# Patient Record
Sex: Male | Born: 1975 | Race: Black or African American | Hispanic: No | Marital: Single | Smoking: Current every day smoker
Health system: Southern US, Community
[De-identification: ages and names within clinical notes are randomized; demographics above are authoritative.]

## PROBLEM LIST (undated history)

## (undated) DIAGNOSIS — I10 Essential (primary) hypertension: Secondary | ICD-10-CM

## (undated) DIAGNOSIS — I639 Cerebral infarction, unspecified: Secondary | ICD-10-CM

## (undated) DIAGNOSIS — B20 Human immunodeficiency virus [HIV] disease: Secondary | ICD-10-CM

## (undated) DIAGNOSIS — Z21 Asymptomatic human immunodeficiency virus [HIV] infection status: Secondary | ICD-10-CM

## (undated) DIAGNOSIS — E7800 Pure hypercholesterolemia, unspecified: Secondary | ICD-10-CM

---

## 2000-02-11 ENCOUNTER — Encounter: Payer: Self-pay | Admitting: Emergency Medicine

## 2000-02-11 ENCOUNTER — Emergency Department (HOSPITAL_COMMUNITY): Admission: EM | Admit: 2000-02-11 | Discharge: 2000-02-11 | Payer: Self-pay | Admitting: Emergency Medicine

## 2000-02-26 ENCOUNTER — Encounter: Payer: Self-pay | Admitting: Emergency Medicine

## 2000-02-26 ENCOUNTER — Encounter: Payer: Self-pay | Admitting: Internal Medicine

## 2000-02-26 ENCOUNTER — Inpatient Hospital Stay (HOSPITAL_COMMUNITY): Admission: EM | Admit: 2000-02-26 | Discharge: 2000-03-01 | Payer: Self-pay | Admitting: Emergency Medicine

## 2000-02-27 ENCOUNTER — Encounter: Payer: Self-pay | Admitting: Internal Medicine

## 2000-02-28 ENCOUNTER — Encounter: Payer: Self-pay | Admitting: Internal Medicine

## 2000-03-01 ENCOUNTER — Encounter: Payer: Self-pay | Admitting: Pulmonary Disease

## 2000-03-05 ENCOUNTER — Ambulatory Visit (HOSPITAL_COMMUNITY): Admission: RE | Admit: 2000-03-05 | Discharge: 2000-03-05 | Payer: Self-pay | Admitting: Internal Medicine

## 2002-07-30 ENCOUNTER — Encounter: Payer: Self-pay | Admitting: Emergency Medicine

## 2002-07-30 ENCOUNTER — Emergency Department (HOSPITAL_COMMUNITY): Admission: EM | Admit: 2002-07-30 | Discharge: 2002-07-30 | Payer: Self-pay | Admitting: Emergency Medicine

## 2002-08-11 ENCOUNTER — Emergency Department (HOSPITAL_COMMUNITY): Admission: EM | Admit: 2002-08-11 | Discharge: 2002-08-11 | Payer: Self-pay | Admitting: *Deleted

## 2002-08-11 ENCOUNTER — Encounter: Payer: Self-pay | Admitting: *Deleted

## 2019-06-25 ENCOUNTER — Emergency Department (HOSPITAL_COMMUNITY)
Admission: EM | Admit: 2019-06-25 | Discharge: 2019-06-25 | Disposition: A | Discharge status: Home / Self Care | Payer: No Typology Code available for payment source | Attending: Emergency Medicine | Admitting: Emergency Medicine

## 2019-06-25 ENCOUNTER — Emergency Department (HOSPITAL_COMMUNITY): Payer: No Typology Code available for payment source

## 2019-06-25 ENCOUNTER — Encounter (HOSPITAL_COMMUNITY): Payer: Self-pay | Admitting: *Deleted

## 2019-06-25 DIAGNOSIS — S161XXA Strain of muscle, fascia and tendon at neck level, initial encounter: Secondary | ICD-10-CM | POA: Diagnosis not present

## 2019-06-25 DIAGNOSIS — R0789 Other chest pain: Secondary | ICD-10-CM | POA: Diagnosis not present

## 2019-06-25 DIAGNOSIS — Y93I9 Activity, other involving external motion: Secondary | ICD-10-CM | POA: Diagnosis not present

## 2019-06-25 DIAGNOSIS — S0990XA Unspecified injury of head, initial encounter: Secondary | ICD-10-CM | POA: Diagnosis present

## 2019-06-25 DIAGNOSIS — R519 Headache, unspecified: Secondary | ICD-10-CM | POA: Insufficient documentation

## 2019-06-25 DIAGNOSIS — Y999 Unspecified external cause status: Secondary | ICD-10-CM | POA: Diagnosis not present

## 2019-06-25 DIAGNOSIS — Y929 Unspecified place or not applicable: Secondary | ICD-10-CM | POA: Insufficient documentation

## 2019-06-25 DIAGNOSIS — Z21 Asymptomatic human immunodeficiency virus [HIV] infection status: Secondary | ICD-10-CM | POA: Insufficient documentation

## 2019-06-25 DIAGNOSIS — M25511 Pain in right shoulder: Secondary | ICD-10-CM | POA: Diagnosis not present

## 2019-06-25 HISTORY — DX: Pure hypercholesterolemia, unspecified: E78.00

## 2019-06-25 HISTORY — DX: Human immunodeficiency virus (HIV) disease: B20

## 2019-06-25 HISTORY — DX: Asymptomatic human immunodeficiency virus (hiv) infection status: Z21

## 2019-06-25 MED ORDER — METHOCARBAMOL 500 MG PO TABS: 500.0000 mg | ORAL_TABLET | Freq: Three times a day (TID) | ORAL | 0 refills | Status: DC | PRN

## 2019-06-25 MED ORDER — IBUPROFEN 800 MG PO TABS: 800.0000 mg | ORAL_TABLET | Freq: Three times a day (TID) | ORAL | 0 refills | Status: DC | PRN
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
## 2019-06-25 NOTE — Discharge Instructions (Signed)

## 2019-06-25 NOTE — ED Notes (Signed)
Pt called for treatment room multiple times no answer 
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
## 2020-02-24 ENCOUNTER — Encounter (HOSPITAL_COMMUNITY): Payer: Self-pay | Admitting: Emergency Medicine

## 2020-02-24 ENCOUNTER — Other Ambulatory Visit: Payer: Self-pay

## 2020-02-24 ENCOUNTER — Ambulatory Visit (HOSPITAL_COMMUNITY)
Admission: EM | Admit: 2020-02-24 | Discharge: 2020-02-24 | Disposition: A | Discharge status: Home / Self Care | Payer: Self-pay | Attending: Emergency Medicine | Admitting: Emergency Medicine

## 2020-02-24 DIAGNOSIS — M5412 Radiculopathy, cervical region: Secondary | ICD-10-CM

## 2020-02-24 MED ORDER — GABAPENTIN 300 MG PO CAPS: ORAL_CAPSULE | ORAL | 0 refills | Status: DC

## 2020-02-24 MED ORDER — ACETAMINOPHEN 325 MG PO TABS
ORAL_TABLET | ORAL | Status: AC
  Filled 2020-02-24: qty 3

## 2020-02-24 MED ORDER — KETOROLAC TROMETHAMINE 30 MG/ML IJ SOLN
INTRAMUSCULAR | Status: AC
  Filled 2020-02-24: qty 1

## 2020-02-24 MED ORDER — METHYLPREDNISOLONE 4 MG PO TBPK: ORAL_TABLET | Freq: Every day | ORAL | 0 refills | Status: DC

## 2020-02-24 MED ORDER — IBUPROFEN 600 MG PO TABS: 600.0000 mg | ORAL_TABLET | Freq: Four times a day (QID) | ORAL | 0 refills | Status: DC | PRN

## 2020-02-24 MED ORDER — ACETAMINOPHEN 325 MG PO TABS
975.0000 mg | ORAL_TABLET | Freq: Once | ORAL | Status: AC
  Administered 2020-02-24: 975 mg via ORAL

## 2020-02-24 MED ORDER — KETOROLAC TROMETHAMINE 30 MG/ML IJ SOLN
30.0000 mg | Freq: Once | INTRAMUSCULAR | Status: AC
  Administered 2020-02-24: 30 mg via INTRAMUSCULAR

## 2020-02-24 MED ORDER — TIZANIDINE HCL 4 MG PO TABS: 4.0000 mg | ORAL_TABLET | Freq: Three times a day (TID) | ORAL | 0 refills | Status: DC | PRN

## 2020-02-24 NOTE — ED Provider Notes (Signed)
HPI  SUBJECTIVE:  Juan Townsend is a 44 y.o. male who presents with 2 days of sharp, burning, stabbing pain starting in his right shoulder blade/neck, states that it has now migrated and is radiating down the outside of his arm to his arm.  He reports muscle spasm and numbness in his thumb.  States that he is constantly lifting things at work, but has not been working more recently.  No change in his physical activity.  No rash, fevers, body aches, flulike symptoms.  No trauma to the neck or shoulder.  No tingling, grip weakness.  No chest pain, shortness of breath.  No antipyretic in the past 4 to 6 hours.  He tried stretching without improvement in symptoms.  Symptoms are worse with neck extension, rotation.  He has never had symptoms like this before.  He has a past medical history of HIV viral load undetectable.  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  No history of osteoporosis, IVDU, and diabetes.  PMD: Earvin Hansen at [facility] in [place].    Past Medical History:  Diagnosis Date  . HIV (human immunodeficiency virus infection) ([facility])   . Hypercholesteremia     History reviewed. No pertinent surgical history.  History reviewed. No pertinent family history.  Social History   Tobacco Use  . Smoking status: Current Every Day Smoker  Substance Use Topics  . Alcohol use: Yes    Alcohol/week: 1.0 standard drink    Types: 1 Cans of beer per week  . Drug use: Yes    Types: Marijuana    No current facility-administered medications for this encounter.  Current Outpatient Medications:  .  aspirin 325 MG tablet, Take 325 mg by mouth daily., Disp: , Rfl:  .  elvitegravir-cobicistat-emtricitabine-tenofovir (GENVOYA) 150-150-200-10 MG TABS tablet, Take 1 tablet by mouth daily., Disp: , Rfl:  .  atorvastatin (LIPITOR) 20 MG tablet, Take 20 mg by mouth daily., Disp: , Rfl:  .   gabapentin (NEURONTIN) 300 MG capsule, 1 tab po at bedtime on day 1, 1 tablet bid on day 2, then 1 tablet tid on day 3., Disp: 30 capsule, Rfl: 0 .  ibuprofen (ADVIL) 600 MG tablet, Take 1 tablet (600 mg total) by mouth every 6 (six) hours as needed., Disp: 30 tablet, Rfl: 0 .  lisinopril (ZESTRIL) 10 MG tablet, Take 10 mg by mouth daily., Disp: , Rfl:  .  methylPREDNISolone (MEDROL DOSEPAK) 4 MG TBPK tablet, Take by mouth daily. Follow package instructions, Disp: 21 tablet, Rfl: 0 .  tiZANidine (ZANAFLEX) 4 MG tablet, Take 1 tablet (4 mg total) by mouth every 8 (eight) hours as needed for muscle spasms., Disp: 30 tablet, Rfl: 0  No Known Allergies   ROS  As noted in HPI.   Physical Exam  BP 132/83 (BP Location: Left Arm)   Pulse 75   Temp 98.4 F (36.9 C) (Oral)   Resp 18   SpO2 96%   Constitutional: Well developed, well nourished, no acute distress Eyes:  EOMI, conjunctiva normal bilaterally HENT: Normocephalic, atraumatic,mucus membranes moist Respiratory: Normal inspiratory effort Cardiovascular: Normal rate GI: nondistended skin: No rash over torso, arm. Musculoskeletal: no deformities.  No tenderness over the shoulder. No limitation of motion at the shoulder.Marland Kitchen  Positive right trapezial tenderness, spasm.  Hypersensitive to touch over the deltoid in the right C6 distribution.  Grip, bicep, tricep strength 5/5 and equal bilaterally.  RP 2+ and  equal.  No C-spine, T-spine tenderness.  Sensation to light touch and temperature grossly intact over entire arm including the C6 distribution. Neurologic: Alert & oriented x 3, no focal neuro deficits Psychiatric: Speech and behavior appropriate   ED Course   Medications  acetaminophen (TYLENOL) tablet 975 mg (975 mg Oral Given 02/24/20 1953)  ketorolac (TORADOL) 30 MG/ML injection 30 mg (30 mg Intramuscular Given 02/24/20 1953)    No orders of the defined types were placed in this encounter.   No results found for this or any  previous visit (from the past 24 hour(s)). No results found.  ED Clinical Impression  1. Cervical radiculopathy      ED Assessment/Plan  Presentation sounds musculoskeletal with a radicular component.  Given the absence of trauma or bony tenderness, deferred imaging.  However in the differential is early shingles with a history of HIV, even though his HIV viral load is undetectable. Will send home with 600 ibuprofen/thousand Tylenol 3-4 times a day, Zanaflex, short course of Neurontin. will give him a wait-and-see Medrol dose pack.  He is to wait 3 more days to see if a rash shows up.  Deferring initiating steroid treatment today in case this is shingles.  Toradol 30 mg IM, 975 mg of Tylenol here.  Follow-up with PMD or may return here as needed, particularly if a rash appears. To the ER if he gets worse, arm weakness, or other concerns.  Discussed  MDM, treatment plan, and plan for follow-up with patient. Discussed sn/sx that should prompt return to the ED. patient agrees with plan.   Meds ordered this encounter  Medications  . acetaminophen (TYLENOL) tablet 975 mg  . ketorolac (TORADOL) 30 MG/ML injection 30 mg  . methylPREDNISolone (MEDROL DOSEPAK) 4 MG TBPK tablet    Sig: Take by mouth daily. Follow package instructions    Dispense:  21 tablet    Refill:  0  . ibuprofen (ADVIL) 600 MG tablet    Sig: Take 1 tablet (600 mg total) by mouth every 6 (six) hours as needed.    Dispense:  30 tablet    Refill:  0  . tiZANidine (ZANAFLEX) 4 MG tablet    Sig: Take 1 tablet (4 mg total) by mouth every 8 (eight) hours as needed for muscle spasms.    Dispense:  30 tablet    Refill:  0  . gabapentin (NEURONTIN) 300 MG capsule    Sig: 1 tab po at bedtime on day 1, 1 tablet bid on day 2, then 1 tablet tid on day 3.    Dispense:  30 capsule    Refill:  0    *This clinic note was created using [employer]. Therefore, there may be occasional mistakes despite careful  proofreading.   ?    Melynda Ripple, MD 02/25/20 [phone]

## 2020-02-24 NOTE — Discharge Instructions (Addendum)
Take 600 mg of ibuprofen combined with 1000 mg of Tylenol 3-4 times a day as needed for pain.  Zanaflex will help with muscle spasms the Neurontin will help with the nerve pain.  Wait at least 3 days to start the Medrol dose pack.  If a rash appears, this is not musculoskeletal, but is shingles, and steroids could be very dangerous.  Come back here or see your doctor we will start you on antivirals and other pain medication.  If no rash appears after 5 or 6 days of being symptomatic, go ahead and start the steroids.

## 2020-02-24 NOTE — ED Triage Notes (Signed)
Right arm pain for 2 days.  No known injury.  Pain radiates from right side of neck, down right arm, to right thumb.  Reports this is sharp pain

## 2020-07-18 ENCOUNTER — Emergency Department (HOSPITAL_COMMUNITY)
Admission: EM | Admit: 2020-07-18 | Discharge: 2020-07-18 | Disposition: A | Discharge status: Home / Self Care | Payer: Self-pay | Attending: Emergency Medicine | Admitting: Emergency Medicine

## 2020-07-18 ENCOUNTER — Other Ambulatory Visit: Payer: Self-pay

## 2020-07-18 ENCOUNTER — Encounter (HOSPITAL_COMMUNITY): Payer: Self-pay

## 2020-07-18 DIAGNOSIS — F17200 Nicotine dependence, unspecified, uncomplicated: Secondary | ICD-10-CM | POA: Insufficient documentation

## 2020-07-18 DIAGNOSIS — A09 Infectious gastroenteritis and colitis, unspecified: Secondary | ICD-10-CM | POA: Insufficient documentation

## 2020-07-18 DIAGNOSIS — Z7982 Long term (current) use of aspirin: Secondary | ICD-10-CM | POA: Insufficient documentation

## 2020-07-18 DIAGNOSIS — Z21 Asymptomatic human immunodeficiency virus [HIV] infection status: Secondary | ICD-10-CM | POA: Insufficient documentation

## 2020-07-18 LAB — URINALYSIS, ROUTINE W REFLEX MICROSCOPIC
Bilirubin Urine: NEGATIVE
Glucose, UA: NEGATIVE mg/dL
Ketones, ur: 20 mg/dL — AB
Leukocytes,Ua: NEGATIVE
Nitrite: NEGATIVE
Protein, ur: 30 mg/dL — AB
Specific Gravity, Urine: 1.024 (ref 1.005–1.030)
pH: 6 (ref 5.0–8.0)

## 2020-07-18 LAB — C DIFFICILE QUICK SCREEN W PCR REFLEX
C Diff antigen: NEGATIVE
C Diff interpretation: NOT DETECTED
C Diff toxin: NEGATIVE

## 2020-07-18 LAB — GASTROINTESTINAL PANEL BY PCR, STOOL (REPLACES STOOL CULTURE)

## 2020-07-18 LAB — CBC
HCT: 44.4 % (ref 39.0–52.0)
Hemoglobin: 15.4 g/dL (ref 13.0–17.0)
MCH: 30.3 pg (ref 26.0–34.0)
MCHC: 34.7 g/dL (ref 30.0–36.0)
MCV: 87.4 fL (ref 80.0–100.0)
Platelets: 268 10*3/uL (ref 150–400)
RBC: 5.08 MIL/uL (ref 4.22–5.81)
RDW: 11.8 % (ref 11.5–15.5)
WBC: 14.4 10*3/uL — ABNORMAL HIGH (ref 4.0–10.5)
nRBC: 0 % (ref 0.0–0.2)

## 2020-07-18 LAB — COMPREHENSIVE METABOLIC PANEL
ALT: 19 U/L (ref 0–44)
AST: 19 U/L (ref 15–41)
Albumin: 3.5 g/dL (ref 3.5–5.0)
Alkaline Phosphatase: 58 U/L (ref 38–126)
Anion gap: 11 (ref 5–15)
BUN: 5 mg/dL — ABNORMAL LOW (ref 6–20)
CO2: 24 mmol/L (ref 22–32)
Calcium: 9.2 mg/dL (ref 8.9–10.3)
Chloride: 98 mmol/L (ref 98–111)
Creatinine, Ser: 1.03 mg/dL (ref 0.61–1.24)
GFR, Estimated: >60 mL/min (ref >60)
Glucose, Bld: 97 mg/dL (ref 70–99)
Potassium: 3.2 mmol/L — ABNORMAL LOW (ref 3.5–5.1)
Sodium: 133 mmol/L — ABNORMAL LOW (ref 135–145)
Total Bilirubin: 0.7 mg/dL (ref 0.3–1.2)
Total Protein: 8.4 g/dL — ABNORMAL HIGH (ref 6.5–8.1)

## 2020-07-18 LAB — LIPASE, BLOOD: Lipase: 24 U/L (ref 11–51)

## 2020-07-18 MED ORDER — ONDANSETRON 4 MG PO TBDP
4.0000 mg | ORAL_TABLET | Freq: Once | ORAL | Status: AC
  Administered 2020-07-18: 4 mg via ORAL
  Filled 2020-07-18: qty 1

## 2020-07-18 MED ORDER — ONDANSETRON 4 MG PO TBDP: 4.0000 mg | ORAL_TABLET | Freq: Three times a day (TID) | ORAL | 0 refills | Status: DC | PRN

## 2020-07-18 NOTE — ED Triage Notes (Signed)
Pt reports constant diarrhea for the past four days. Some nausea, denies vomiting or fevers.

## 2020-07-18 NOTE — Discharge Instructions (Addendum)
Continue to hydrate well.  You can add fiber to your diet.  Stool testing is being performed, if there is a significant finding that requires treatment you will be contacted.  If not this is likely infectious diarrhea that will run its course without significant intervention.  Zofran is provided for as needed nausea.

## 2020-07-18 NOTE — ED Provider Notes (Signed)
[facility] EMERGENCY DEPARTMENT Provider Note   CSN: [id number] Arrival date & time: 07/18/20  0453     History Chief Complaint  Patient presents with  . Diarrhea    Juan Townsend is a 44 y.o. male.   Diarrhea Quality:  Semi-solid and watery Severity:  Moderate Duration:  4 days Timing:  Constant Progression:  Unchanged Relieved by:  Nothing Worsened by:  Nothing Ineffective treatments:  None tried Associated symptoms: abdominal pain   Associated symptoms: no arthralgias, no chills, no diaphoresis, no fever, no headaches and no vomiting        Past Medical History:  Diagnosis Date  . HIV (human immunodeficiency virus infection) (HCC)   . Hypercholesteremia     There are no problems to display for this patient.   History reviewed. No pertinent surgical history.     No family history on file.  Social History   Tobacco Use  . Smoking status: Current Every Day Smoker  Substance Use Topics  . Alcohol use: Yes    Alcohol/week: 1.0 standard drink    Types: 1 Cans of beer per week  . Drug use: Yes    Types: Marijuana    Home Medications Prior to Admission medications   Medication Sig Start Date End Date Taking? Authorizing Provider  aspirin 325 MG tablet Take 325 mg by mouth daily.   Yes [provider]  elvitegravir-cobicistat-emtricitabine-tenofovir (GENVOYA) 150-150-200-10 MG TABS tablet Take 1 tablet by mouth daily. 12/17/19  Yes [provider]  lisinopril (ZESTRIL) 10 MG tablet Take 10 mg by mouth daily. 11/20/19  Yes [provider]  gabapentin (NEURONTIN) 300 MG capsule 1 tab po at bedtime on day 1, 1 tablet bid on day 2, then 1 tablet tid on day 3. Patient not taking: Reported on 07/18/2020 02/24/20   Domenick Gong, MD  ibuprofen (ADVIL) 600 MG tablet Take 1 tablet (600 mg total) by mouth every 6 (six) hours as needed. Patient not taking: Reported on 07/18/2020 02/24/20   Domenick Gong, MD    methylPREDNISolone (MEDROL DOSEPAK) 4 MG TBPK tablet Take by mouth daily. Follow package instructions Patient not taking: Reported on 07/18/2020 02/24/20   Domenick Gong, MD  ondansetron (ZOFRAN ODT) 4 MG disintegrating tablet Take 1 tablet (4 mg total) by mouth every 8 (eight) hours as needed for up to 10 doses for nausea or vomiting. 07/18/20   Sabino Donovan, MD  tiZANidine (ZANAFLEX) 4 MG tablet Take 1 tablet (4 mg total) by mouth every 8 (eight) hours as needed for muscle spasms. Patient not taking: Reported on 07/18/2020 02/24/20   Domenick Gong, MD    Allergies    Patient has no known allergies.  Review of Systems   Review of Systems  Constitutional: Negative for chills, diaphoresis and fever.  HENT: Negative for congestion and rhinorrhea.   Respiratory: Negative for cough and shortness of breath.   Cardiovascular: Negative for chest pain and palpitations.  Gastrointestinal: Positive for abdominal pain, blood in stool (possible) and diarrhea. Negative for nausea and vomiting.  Genitourinary: Negative for difficulty urinating and dysuria.  Musculoskeletal: Negative for arthralgias and back pain.  Skin: Negative for color change and rash.  Neurological: Negative for light-headedness and headaches.    Physical Exam Updated Vital Signs BP (!) 155/108   Pulse 81   Temp 98 F (36.7 C) (Oral)   Resp 17   Ht 5\' 7"  (1.702 m)   Wt 72.6 kg   SpO2 100%   BMI  25.06 kg/m   Physical Exam Vitals and nursing note reviewed.  Constitutional:      General: He is not in acute distress.    Appearance: Normal appearance.  HENT:     Head: Normocephalic and atraumatic.     Nose: No rhinorrhea.     Mouth/Throat:     Mouth: Mucous membranes are moist.  Eyes:     General:        Right eye: No discharge.        Left eye: No discharge.     Conjunctiva/sclera: Conjunctivae normal.  Cardiovascular:     Rate and Rhythm: Normal rate and regular rhythm.  Pulmonary:     Effort: Pulmonary  effort is normal.     Breath sounds: No stridor.  Abdominal:     General: Abdomen is flat. There is no distension.     Palpations: Abdomen is soft.     Tenderness: There is no abdominal tenderness. There is no guarding.  Musculoskeletal:        General: No deformity or signs of injury.  Skin:    General: Skin is warm and dry.     Capillary Refill: Capillary refill takes less than 2 seconds.  Neurological:     General: No focal deficit present.     Mental Status: He is alert. Mental status is at baseline.     Motor: No weakness.  Psychiatric:        Mood and Affect: Mood normal.        Behavior: Behavior normal.        Thought Content: Thought content normal.     ED Results / Procedures / Treatments   Labs (all labs ordered are listed, but only abnormal results are displayed) Labs Reviewed  COMPREHENSIVE METABOLIC PANEL - Abnormal; Notable for the following components:      Result Value   Sodium 133 (*)    Potassium 3.2 (*)    BUN 5 (*)    Total Protein 8.4 (*)    All other components within normal limits  CBC - Abnormal; Notable for the following components:   WBC 14.4 (*)    All other components within normal limits  URINALYSIS, ROUTINE W REFLEX MICROSCOPIC - Abnormal; Notable for the following components:   Color, Urine AMBER (*)    APPearance HAZY (*)    Hgb urine dipstick MODERATE (*)    Ketones, ur 20 (*)    Protein, ur 30 (*)    Bacteria, UA RARE (*)    All other components within normal limits  GASTROINTESTINAL PANEL BY PCR, STOOL (REPLACES STOOL CULTURE)  C DIFFICILE QUICK SCREEN W PCR REFLEX  LIPASE, BLOOD    EKG None  Radiology No results found.  Procedures Procedures (including critical care time)  Medications Ordered in ED Medications  ondansetron (ZOFRAN-ODT) disintegrating tablet 4 mg (has no administration in time range)    ED Course  I have reviewed the triage vital signs and the nursing notes.  Pertinent labs & imaging results that  were available during my care of the patient were reviewed by me and considered in my medical decision making (see chart for details).    MDM Rules/Calculators/A&P                          4 days of watery semisolid stools, frequent episodes a day.  Mild nausea and abdominal cramping, no signs of peritonitis on exam vital signs stable lab studies  done first look reviewed by myself show only leukocytosis, mild hypokalemia otherwise unremarkable.  Patient does have HIV but says his CD4 counts are high and his viral load is low.  He is well-appearing overall looks well-hydrated.  Supple infectious diarrhea, stool testing will be sent, Zofran discharged home with him, strict return precautions given. Final Clinical Impression(s) / ED Diagnoses Final diagnoses:  Diarrhea, infectious, adult    Rx / DC Orders ED Discharge Orders         Ordered    ondansetron (ZOFRAN ODT) 4 MG disintegrating tablet  Every 8 hours PRN        07/18/20 0704           Sabino Donovan, MD 07/18/20 [phone]
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
## 2023-10-15 ENCOUNTER — Other Ambulatory Visit (HOSPITAL_COMMUNITY): Payer: Self-pay

## 2023-10-15 ENCOUNTER — Telehealth: Payer: Self-pay

## 2023-10-15 NOTE — Telephone Encounter (Signed)
 Pharmacy Patient Advocate Encounter  Insurance verification completed.   The patient is insured through [facility]   Ran test claim for Biktarvy . Currently a quantity of 30 is a 30 day supply and the co-pay is $3913.00 . Dovato $7122.85 with evoucher $0.00 Arlyce is covered as well.  This test claim was processed through [facility]- copay amounts may vary at other pharmacies due to pharmacy/plan contracts, or as the patient moves through the different stages of their insurance plan.

## 2023-10-17 ENCOUNTER — Other Ambulatory Visit: Payer: Self-pay

## 2023-10-17 ENCOUNTER — Ambulatory Visit (INDEPENDENT_AMBULATORY_CARE_PROVIDER_SITE_OTHER): Payer: Self-pay | Admitting: Internal Medicine

## 2023-10-17 ENCOUNTER — Encounter: Payer: Self-pay | Admitting: Internal Medicine

## 2023-10-17 ENCOUNTER — Ambulatory Visit: Payer: Self-pay

## 2023-10-17 VITALS — BP 227/137 | HR 74 | Temp 99.0°F | Ht 69.0 in | Wt 149.0 lb

## 2023-10-17 DIAGNOSIS — Z23 Encounter for immunization: Secondary | ICD-10-CM

## 2023-10-17 DIAGNOSIS — B20 Human immunodeficiency virus [HIV] disease: Secondary | ICD-10-CM

## 2023-10-17 MED ORDER — ELVITEG-COBIC-EMTRICIT-TENOFAF 150-150-200-10 MG PO TABS: 1.0000 | ORAL_TABLET | Freq: Every day | ORAL | 0 refills | Status: DC

## 2023-10-17 NOTE — Patient Instructions (Signed)
 For primary care, please call either [facility] [phone]  - Internal Medicine [phone]
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
## 2023-10-18 ENCOUNTER — Encounter: Payer: Self-pay | Admitting: Internal Medicine

## 2023-10-18 LAB — T-HELPER CELL (CD4) - (RCID CLINIC ONLY)
CD4 % Helper T Cell: 24 % — ABNORMAL LOW (ref 33–65)
CD4 T Cell Abs: 346 /uL — ABNORMAL LOW (ref 400–1790)

## 2023-10-21 LAB — CYTOLOGY, (ORAL, ANAL, URETHRAL) ANCILLARY ONLY
Chlamydia: NEGATIVE
Chlamydia: NEGATIVE
Comment: NEGATIVE
Comment: NEGATIVE
Comment: NORMAL
Comment: NORMAL
Neisseria Gonorrhea: NEGATIVE
Neisseria Gonorrhea: NEGATIVE

## 2023-10-21 LAB — URINE CYTOLOGY ANCILLARY ONLY
Chlamydia: NEGATIVE
Comment: NEGATIVE
Comment: NORMAL
Neisseria Gonorrhea: NEGATIVE

## 2023-10-24 ENCOUNTER — Telehealth: Payer: Self-pay

## 2023-10-24 ENCOUNTER — Ambulatory Visit: Payer: Self-pay | Admitting: Internal Medicine

## 2023-10-24 ENCOUNTER — Other Ambulatory Visit: Payer: Self-pay

## 2023-10-24 VITALS — BP 208/141 | HR 119 | Temp 99.4°F

## 2023-10-24 DIAGNOSIS — R030 Elevated blood-pressure reading, without diagnosis of hypertension: Secondary | ICD-10-CM

## 2023-10-24 DIAGNOSIS — Z21 Asymptomatic human immunodeficiency virus [HIV] infection status: Secondary | ICD-10-CM

## 2023-10-24 DIAGNOSIS — B20 Human immunodeficiency virus [HIV] disease: Secondary | ICD-10-CM

## 2023-10-24 LAB — CYTOLOGY - PAP: Diagnosis: NEGATIVE

## 2023-10-24 MED ORDER — BICTEGRAVIR-EMTRICITAB-TENOFOV 50-200-25 MG PO TABS: 1.0000 | ORAL_TABLET | Freq: Every day | ORAL | 1 refills | Status: DC

## 2023-10-24 NOTE — Progress Notes (Signed)
Provider recommended patient go to ED for HTN. Emphasized importance of urgent evaluation due to high risk for stroke. Reviewed BEFAST symptoms with patient.   Sandie Ano, RN

## 2023-10-24 NOTE — Progress Notes (Signed)
 [facility]     HPI: Juan Townsend is a 48 y.o. male presents for HIV management referrd vy [facility] CD4 685(37.8%), VL <20 on 12/17/19.  PT has been out of genvoya x 2 months. Pt was seen in [place] and HD for HIV care. He was last seen about 4 months.  He moved to [place], because he was "escaping from [place], [place] because nothing was gong on". He can't remember the name of hte place where he was seen for HIV care.  PMHxL Ischemic cva recieved tpA  Date of diagnosis 2008 ART exposure Atripla->Genvoya Past Ois: bibe Risk factors: MSM Partners in last 2months 0, in the last 12 months 0. Last sexually active for years.  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  No family history on file. Current Outpatient Medications on File Prior to Visit  Medication Sig Dispense Refill   aspirin 325 MG tablet Take 325 mg by mouth daily. (Patient not taking: Reported on 10/17/2023)     elvitegravir-cobicistat-emtricitabine-tenofovir (GENVOYA) 150-150-200-10 MG TABS tablet Take 1 tablet by mouth daily. 30 tablet 0   gabapentin (NEURONTIN) 300 MG capsule 1 tab po at bedtime on day 1, 1 tablet bid on day 2, then 1 tablet tid on day 3. (Patient not taking: Reported on 07/18/2020) 30 capsule 0   ibuprofen (ADVIL) 600 MG tablet Take 1 tablet (600 mg total) by mouth every 6 (six) hours as needed. (Patient not taking: Reported on 07/18/2020) 30 tablet 0   lisinopril (ZESTRIL) 10 MG tablet Take 10 mg by mouth daily. (Patient not taking: Reported on 10/17/2023)     methylPREDNISolone (MEDROL DOSEPAK) 4 MG TBPK tablet Take by mouth daily. Follow package  instructions (Patient not taking: Reported on 07/18/2020) 21 tablet 0   ondansetron (ZOFRAN ODT) 4 MG disintegrating tablet Take 1 tablet (4 mg total) by mouth every 8 (eight) hours as needed for up to 10 doses for nausea or vomiting. (Patient not taking: Reported on 10/17/2023) 10 tablet 0   tiZANidine (ZANAFLEX) 4 MG tablet Take 1 tablet (4 mg total) by mouth every 8 (eight) hours as needed for muscle spasms. (Patient not taking: Reported on 07/18/2020) 30 tablet 0   No current facility-administered medications on file prior to visit.    No Known Allergies    Lab Results HIV 1 RNA Quant (copies/mL)  Date Value  10/17/2023 2,310 (H)   CD4 T Cell Abs (/uL)  Date Value  10/17/2023 346 (L)   No results found for: "HIV1GENOSEQ" Lab Results  Component Value Date   WBC 4.3 10/17/2023   HGB 12.4 (L) 10/17/2023   HCT 36.5 (L) 10/17/2023   MCV 89.9 10/17/2023   PLT 221 10/17/2023    Lab Results  Component Value Date   CREATININE 1.24 10/17/2023   BUN 13 10/17/2023   NA 138 10/17/2023   K 3.9 10/17/2023   CL 104 10/17/2023   CO2 26 10/17/2023   Lab Results  Component Value Date   ALT 11 10/17/2023   AST 19 10/17/2023   ALKPHOS 58 07/18/2020   BILITOT 0.8 10/17/2023    Lab Results  Component  Value Date   CHOL 128 10/17/2023   TRIG 56 10/17/2023   HDL 43 10/17/2023   LDLCALC 71 10/17/2023   Lab Results  Component Value Date   HAV REACTIVE (A) 10/17/2023   Lab Results  Component Value Date   HEPBSAG NON-REACTIVE 10/17/2023   HEPBSAB REACTIVE (A) 10/17/2023   No results found for: "HCVAB" Lab Results  Component Value Date   CHLAMYDIAWP Negative 10/17/2023   CHLAMYDIAWP Negative 10/17/2023   CHLAMYDIAWP Negative 10/17/2023   N Negative 10/17/2023   N Negative 10/17/2023   N Negative 10/17/2023   No results found for: "GCPROBEAPT" No results found for: "QUANTGOLD"  Assessment/Plan  Assessment and Plan #HIV/Asymptomatic VL 2310, CD4 346 on 10/17/23 Start  biktarvy. Stop Genvoya Met with pharmacy HIV Labs today Labs in 2 week with greg calone  #Elevated BP -BP elevated, no symptoms of HA, dizziness or blurry vision. Counsled to go to ED for HTN, next available new patient PCP appt in April.  -Pt agreed with plan.    #Hx of CVA -lipid panel -likely start statin. Note to be on atorvastatin 20 mg on 05/09/2018     #Vaccination->requested from [facility] COVID Flu-today Monkeypox PCV Meningitis HepA HEpB Tdap Shingles   #Health maintenance Labs  10/16/22 -Quantiferon negative -RPR NR -HCV negative -GC 3 point negative -Lipid  LDL 71 -Dysplasia screen M NLLM on 26/24 Discuss NV: -Colonoscopy    Danelle Earthly, MD [facility]   I have personally spent 45 minutes involved in face-to-face and non-face-to-face activities for this patient on the day of the visit. Professional time spent includes the following activities: Preparing to see the patient (review of tests), Obtaining and/or reviewing separately obtained history (admission/discharge record), Performing a medically appropriate examination and/or evaluation , Ordering medications/tests/procedures, referring and communicating with other health care professionals, Documenting clinical information in the EMR, Independently interpreting results (not separately reported), Communicating results to the patient/family/caregiver, Counseling and educating the patient/family/caregiver and Care coordination (not separately reported).

## 2023-10-24 NOTE — Patient Instructions (Signed)
For primary care, please call either [facility] [phone]  - Internal Medicine [phone]

## 2023-10-24 NOTE — Telephone Encounter (Signed)
Spoke with the patient today and counseled on Biktarvy. We discussed on how to take Biktarvy and the importance of keeping up with doses and appointments. Patient was instructed to reach out to [place] if at any point he needs samples to carry him over. His prescription was sent to [facility] on [address]. He will reach out to [place] if he has any questions.

## 2023-10-26 ENCOUNTER — Encounter (HOSPITAL_COMMUNITY): Payer: Self-pay

## 2023-10-26 ENCOUNTER — Emergency Department (HOSPITAL_COMMUNITY)
Admission: EM | Admit: 2023-10-26 | Discharge: 2023-10-26 | Disposition: A | Discharge status: Home / Self Care | Payer: Self-pay | Attending: Emergency Medicine | Admitting: Emergency Medicine

## 2023-10-26 ENCOUNTER — Other Ambulatory Visit: Payer: Self-pay

## 2023-10-26 DIAGNOSIS — Z21 Asymptomatic human immunodeficiency virus [HIV] infection status: Secondary | ICD-10-CM | POA: Insufficient documentation

## 2023-10-26 DIAGNOSIS — Z7982 Long term (current) use of aspirin: Secondary | ICD-10-CM | POA: Insufficient documentation

## 2023-10-26 DIAGNOSIS — R7989 Other specified abnormal findings of blood chemistry: Secondary | ICD-10-CM | POA: Insufficient documentation

## 2023-10-26 DIAGNOSIS — I10 Essential (primary) hypertension: Secondary | ICD-10-CM | POA: Insufficient documentation

## 2023-10-26 DIAGNOSIS — Z79899 Other long term (current) drug therapy: Secondary | ICD-10-CM | POA: Insufficient documentation

## 2023-10-26 LAB — HEPATITIS B CORE ANTIBODY, TOTAL: Hep B Core Total Ab: NONREACTIVE

## 2023-10-26 LAB — URINALYSIS, ROUTINE W REFLEX MICROSCOPIC
Bilirubin Urine: NEGATIVE
Glucose, UA: NEGATIVE mg/dL
Ketones, ur: 5 mg/dL — AB
Leukocytes,Ua: NEGATIVE
Nitrite: NEGATIVE
Protein, ur: 30 mg/dL — AB
Specific Gravity, Urine: 1.018 (ref 1.005–1.030)
pH: 5 (ref 5.0–8.0)

## 2023-10-26 LAB — CBC WITH DIFFERENTIAL/PLATELET
Abs Immature Granulocytes: 0.03 10*3/uL (ref 0.00–0.07)
Absolute Lymphocytes: 1660 {cells}/uL (ref 850–3900)
Absolute Monocytes: 301 {cells}/uL (ref 200–950)
Basophils Absolute: 30 {cells}/uL (ref 0–200)
Basophils Absolute: 0 10*3/uL (ref 0.0–0.1)
Basophils Relative: 0.7 %
Basophils Relative: 0 %
Eosinophils Absolute: 39 {cells}/uL (ref 15–500)
Eosinophils Absolute: 0 10*3/uL (ref 0.0–0.5)
Eosinophils Relative: 0.9 %
Eosinophils Relative: 0 %
HCT: 36.5 % — ABNORMAL LOW (ref 38.5–50.0)
HCT: 37.5 % — ABNORMAL LOW (ref 39.0–52.0)
Hemoglobin: 12.4 g/dL — ABNORMAL LOW (ref 13.2–17.1)
Hemoglobin: 13.1 g/dL (ref 13.0–17.0)
Immature Granulocytes: 0 %
Lymphocytes Relative: 15 %
Lymphs Abs: 1.7 10*3/uL (ref 0.7–4.0)
MCH: 30.5 pg (ref 27.0–33.0)
MCH: 30.8 pg (ref 26.0–34.0)
MCHC: 34 g/dL (ref 32.0–36.0)
MCHC: 34.9 g/dL (ref 30.0–36.0)
MCV: 89.9 fL (ref 80.0–100.0)
MCV: 88.2 fL (ref 80.0–100.0)
MPV: 11 fL (ref 7.5–12.5)
Monocytes Absolute: 1.1 10*3/uL — ABNORMAL HIGH (ref 0.1–1.0)
Monocytes Relative: 7 %
Monocytes Relative: 9 %
Neutro Abs: 2270 {cells}/uL (ref 1500–7800)
Neutro Abs: 8.7 10*3/uL — ABNORMAL HIGH (ref 1.7–7.7)
Neutrophils Relative %: 52.8 %
Neutrophils Relative %: 76 %
Platelets: 221 10*3/uL (ref 140–400)
Platelets: 188 10*3/uL (ref 150–400)
RBC: 4.06 10*6/uL — ABNORMAL LOW (ref 4.20–5.80)
RBC: 4.25 MIL/uL (ref 4.22–5.81)
RDW: 11.9 % (ref 11.0–15.0)
RDW: 12.1 % (ref 11.5–15.5)
Total Lymphocyte: 38.6 %
WBC: 4.3 10*3/uL (ref 3.8–10.8)
WBC: 11.5 10*3/uL — ABNORMAL HIGH (ref 4.0–10.5)
nRBC: 0 % (ref 0.0–0.2)

## 2023-10-26 LAB — HIV-1 INTEGRASE GENOTYPE

## 2023-10-26 LAB — BASIC METABOLIC PANEL
Anion gap: 13 (ref 5–15)
BUN: 22 mg/dL — ABNORMAL HIGH (ref 6–20)
CO2: 22 mmol/L (ref 22–32)
Calcium: 8.9 mg/dL (ref 8.9–10.3)
Chloride: 94 mmol/L — ABNORMAL LOW (ref 98–111)
Creatinine, Ser: 1.61 mg/dL — ABNORMAL HIGH (ref 0.61–1.24)
GFR, Estimated: 53 mL/min — ABNORMAL LOW (ref >60)
Glucose, Bld: 101 mg/dL — ABNORMAL HIGH (ref 70–99)
Potassium: 4.5 mmol/L (ref 3.5–5.1)
Sodium: 129 mmol/L — ABNORMAL LOW (ref 135–145)

## 2023-10-26 LAB — COMPLETE METABOLIC PANEL WITH GFR
AG Ratio: 1 (calc) (ref 1.0–2.5)
ALT: 11 U/L (ref 9–46)
AST: 19 U/L (ref 10–40)
Albumin: 4.2 g/dL (ref 3.6–5.1)
Alkaline phosphatase (APISO): 41 U/L (ref 36–130)
BUN: 13 mg/dL (ref 7–25)
CO2: 26 mmol/L (ref 20–32)
Calcium: 9.5 mg/dL (ref 8.6–10.3)
Chloride: 104 mmol/L (ref 98–110)
Creat: 1.24 mg/dL (ref 0.60–1.29)
Globulin: 4.4 g/dL — ABNORMAL HIGH (ref 1.9–3.7)
Glucose, Bld: 78 mg/dL (ref 65–99)
Potassium: 3.9 mmol/L (ref 3.5–5.3)
Sodium: 138 mmol/L (ref 135–146)
Total Bilirubin: 0.8 mg/dL (ref 0.2–1.2)
Total Protein: 8.6 g/dL — ABNORMAL HIGH (ref 6.1–8.1)
eGFR: 73 mL/min/{1.73_m2} (ref >=60)

## 2023-10-26 LAB — HIV-1 GENOTYPE: HIV-1 Genotype: DETECTED — AB

## 2023-10-26 LAB — LIPID PANEL
Cholesterol: 128 mg/dL (ref <200)
HDL: 43 mg/dL (ref >=40)
LDL Cholesterol (Calc): 71 mg/dL
Non-HDL Cholesterol (Calc): 85 mg/dL (ref <130)
Total CHOL/HDL Ratio: 3 (calc) (ref <5.0)
Triglycerides: 56 mg/dL (ref <150)

## 2023-10-26 LAB — URINALYSIS
Bilirubin Urine: NEGATIVE
Glucose, UA: NEGATIVE
Hgb urine dipstick: NEGATIVE
Ketones, ur: NEGATIVE
Nitrite: NEGATIVE
Specific Gravity, Urine: 1.017 (ref 1.001–1.035)
pH: 6.5 (ref 5.0–8.0)

## 2023-10-26 LAB — HEPATITIS A ANTIBODY, TOTAL: Hepatitis A AB,Total: REACTIVE — AB

## 2023-10-26 LAB — HLA B*5701: HLA-B*5701 w/rflx HLA-B High: NEGATIVE

## 2023-10-26 LAB — QUANTIFERON-TB GOLD PLUS
Mitogen-NIL: 4.65 [IU]/mL
NIL: 0.07 [IU]/mL
QuantiFERON-TB Gold Plus: NEGATIVE
TB1-NIL: <0 [IU]/mL
TB2-NIL: <0 [IU]/mL

## 2023-10-26 LAB — HEPATITIS B SURFACE ANTIBODY,QUALITATIVE: Hep B S Ab: REACTIVE — AB

## 2023-10-26 LAB — HIV-1/2 AB - DIFFERENTIATION
HIV-1 antibody: POSITIVE — AB
HIV-2 Ab: NEGATIVE

## 2023-10-26 LAB — HEPATITIS B SURFACE ANTIGEN: Hepatitis B Surface Ag: NONREACTIVE

## 2023-10-26 LAB — HIV RNA, RTPCR W/R GT (RTI, PI,INT)
HIV 1 RNA Quant: 2310 {copies}/mL — ABNORMAL HIGH
HIV-1 RNA Quant, Log: 3.36 {Log} — ABNORMAL HIGH

## 2023-10-26 LAB — HIV ANTIBODY (ROUTINE TESTING W REFLEX): HIV 1&2 Ab, 4th Generation: REACTIVE — AB

## 2023-10-26 LAB — HEPATITIS C ANTIBODY: Hepatitis C Ab: NONREACTIVE

## 2023-10-26 LAB — RPR: RPR Ser Ql: NONREACTIVE

## 2023-10-26 MED ORDER — METOPROLOL TARTRATE 25 MG PO TABS
25.0000 mg | ORAL_TABLET | ORAL | Status: AC
  Administered 2023-10-26: 25 mg via ORAL
  Filled 2023-10-26: qty 1

## 2023-10-26 MED ORDER — METOPROLOL TARTRATE 25 MG PO TABS: 25.0000 mg | ORAL_TABLET | Freq: Two times a day (BID) | ORAL | 2 refills | Status: DC

## 2023-10-26 NOTE — ED Provider Notes (Signed)
 [place] EMERGENCY DEPARTMENT AT [facility] Provider Note   CSN: [id number] Arrival date & time: 10/26/23  1235     History  Chief Complaint  Patient presents with   Hypertension   Back Pain    Juan Townsend is a 48 y.o. male.   Hypertension  Back Pain  Patient is a 6 male with a history of HIV, he has a history of previously being treated for hypertension with lisinopril based on the medical record but states he has not been on this for over a year.  He had lived in [place] until recently and has now relocated here.  The patient reports that he has been measuring blood pressures as high as 200 systolic at home, he was advised by his infectious disease clinic to come to the hospital but because of his work schedule he has not yet come.  Today he comes because he has a day off.  He states he feels generally weak but does not have any focal weakness, no fevers, no vomiting or diarrhea, he is not coughing or feeling short of breath.  He otherwise appears to be his normal self.  He is primarily concerned about his blood pressure being up and is generalized weakness.  I have reviewed the medical record and it appears that the patient was actually seen in the infectious disease clinic a couple of days ago and because of hypertension it was recommended that he come to the ER, he did not come at that time.  There is no other notes from that visit  The patient had been seen on February 6 in the HIV clinic, it was noted that his viral load was less than 20 in 2021, last CD4 was 685 at that same time.  Has been diagnosed in 2008 the patient does drink a heavy amount of liquor daily, it was recommended that the patient get restarted on HIV medications, they wanted to start Biktarvy  Blood pressure at that visit was 227/137, assessment did not comment on hypertension.    Home Medications Prior to Admission medications   Medication Sig Start Date End Date Taking? Authorizing Provider   metoprolol tartrate (LOPRESSOR) 25 MG tablet Take 1 tablet (25 mg total) by mouth 2 (two) times daily. 10/26/23  Yes Eber Hong, MD  aspirin 325 MG tablet Take 325 mg by mouth daily. Patient not taking: Reported on 10/17/2023    [provider]  bictegravir-emtricitabine-tenofovir AF (BIKTARVY) 50-200-25 MG TABS tablet Take 1 tablet by mouth daily. Try to take at the same time each day with or without food. 10/24/23   Danelle Earthly, MD  gabapentin (NEURONTIN) 300 MG capsule 1 tab po at bedtime on day 1, 1 tablet bid on day 2, then 1 tablet tid on day 3. Patient not taking: Reported on 07/18/2020 02/24/20   Domenick Gong, MD  ibuprofen (ADVIL) 600 MG tablet Take 1 tablet (600 mg total) by mouth every 6 (six) hours as needed. Patient not taking: Reported on 07/18/2020 02/24/20   Domenick Gong, MD  lisinopril (ZESTRIL) 10 MG tablet Take 10 mg by mouth daily. Patient not taking: Reported on 10/17/2023 11/20/19   [provider]  methylPREDNISolone (MEDROL DOSEPAK) 4 MG TBPK tablet Take by mouth daily. Follow package instructions Patient not taking: Reported on 07/18/2020 02/24/20   Domenick Gong, MD  ondansetron (ZOFRAN ODT) 4 MG disintegrating tablet Take 1 tablet (4 mg total) by mouth every 8 (eight) hours as needed for up to 10 doses  for nausea or vomiting. Patient not taking: Reported on 10/17/2023 07/18/20   Sabino Donovan, MD  tiZANidine (ZANAFLEX) 4 MG tablet Take 1 tablet (4 mg total) by mouth every 8 (eight) hours as needed for muscle spasms. Patient not taking: Reported on 07/18/2020 02/24/20   Domenick Gong, MD      Allergies    Patient has no known allergies.    Review of Systems   Review of Systems  Musculoskeletal:  Positive for back pain.  All other systems reviewed and are negative.   Physical Exam Updated Vital Signs BP (!) 180/126   Pulse 84   Temp 99.2 F (37.3 C) (Oral)   Resp 20   SpO2 100%  Physical Exam Vitals and nursing note reviewed.   Constitutional:      General: He is not in acute distress.    Appearance: He is well-developed.  HENT:     Head: Normocephalic and atraumatic.     Mouth/Throat:     Pharynx: No oropharyngeal exudate.  Eyes:     General: No scleral icterus.       Right eye: No discharge.        Left eye: No discharge.     Conjunctiva/sclera: Conjunctivae normal.     Pupils: Pupils are equal, round, and reactive to light.  Neck:     Thyroid: No thyromegaly.     Vascular: No JVD.  Cardiovascular:     Rate and Rhythm: Normal rate and regular rhythm.     Heart sounds: Normal heart sounds. No murmur heard.    No friction rub. No gallop.  Pulmonary:     Effort: Pulmonary effort is normal. No respiratory distress.     Breath sounds: Normal breath sounds. No wheezing or rales.  Abdominal:     General: Bowel sounds are normal. There is no distension.     Palpations: Abdomen is soft. There is no mass.     Tenderness: There is no abdominal tenderness.  Musculoskeletal:        General: No tenderness. Normal range of motion.     Cervical back: Normal range of motion and neck supple.  Lymphadenopathy:     Cervical: No cervical adenopathy.  Skin:    General: Skin is warm and dry.     Findings: No erythema or rash.  Neurological:     Mental Status: He is alert.     Coordination: Coordination normal.     Comments: Normal speech coordination and cranial nerves III through XII, normal gait and strength in all 4 extremities.  Normal coordination  Psychiatric:        Behavior: Behavior normal.     ED Results / Procedures / Treatments   Labs (all labs ordered are listed, but only abnormal results are displayed) Labs Reviewed  CBC WITH DIFFERENTIAL/PLATELET - Abnormal; Notable for the following components:      Result Value   WBC 11.5 (*)    HCT 37.5 (*)    Neutro Abs 8.7 (*)    Monocytes Absolute 1.1 (*)    All other components within normal limits  BASIC METABOLIC PANEL - Abnormal; Notable for the  following components:   Sodium 129 (*)    Chloride 94 (*)    Glucose, Bld 101 (*)    BUN 22 (*)    Creatinine, Ser 1.61 (*)    GFR, Estimated 53 (*)    All other components within normal limits  URINALYSIS, ROUTINE W REFLEX MICROSCOPIC - Abnormal; Notable  for the following components:   Hgb urine dipstick MODERATE (*)    Ketones, ur 5 (*)    Protein, ur 30 (*)    Bacteria, UA RARE (*)    All other components within normal limits    EKG EKG Interpretation Date/Time:  Saturday October 26 2023 14:04:57 EST Ventricular Rate:  84 PR Interval:  143 QRS Duration:  102 QT Interval:  340 QTC Calculation: 402 R Axis:   52  Text Interpretation: Sinus rhythm Probable left atrial enlargement Left ventricular hypertrophy Anterior Q waves, possibly due to LVH Nonspecific T abnormalities, inferior leads Confirmed by Eber Hong ([id number]) on 10/26/2023 3:13:17 PM  Radiology No results found.  Procedures Procedures    Medications Ordered in ED Medications  metoprolol tartrate (LOPRESSOR) tablet 25 mg (25 mg Oral Given 10/26/23 1401)    ED Course/ Medical Decision Making/ A&P                                 Medical Decision Making Amount and/or Complexity of Data Reviewed Labs: ordered. ECG/medicine tests: ordered.  Risk Prescription drug management.    This patient presents to the ED for concern of generalized weakness and severe hypertension, this involves an extensive number of treatment options, and is a complaint that carries with it a high risk of complications and morbidity.  The differential diagnosis includes hypertensive weakness, could be related to electrolyte abnormalities   Co morbidities that complicate the patient evaluation  HIV positive   Additional history obtained:  Additional history obtained from the patient and the medical record External records from outside source obtained and reviewed including prior labs, his HIV quant was over 2300, CD4 of 346  as of October 17, 2023. The above for infectious disease clinic notes   Lab Tests:  I Ordered, and personally interpreted labs.  The pertinent results include: Creatinine of 1.6, it was 1.24 at his visit on February 6 Urinalysis negative   Imaging Studies ordered:  Not indicated  Cardiac Monitoring: / EKG:  The patient was maintained on a cardiac monitor.  I personally viewed and interpreted the cardiac monitored which showed an underlying rhythm of: Normal sinus rhythm, heart rate around 95    Problem List / ED Course / Critical interventions / Medication management  Patient's blood pressure remains severely elevated Blood pressure improved with metoprolol I ordered medication including metoprolol for hypertension Reevaluation of the patient after these medicines showed that the patient improved I have reviewed the patients home medicines and have made adjustments as needed   Social Determinants of Health:  The patient was informed of his elevated creatinine, this will need to be followed up, I have encouraged him to drink plenty of clear liquids, he states that his urine has been dark, will check a urinalysis prior to discharge HIV positive    Test / Admission - Considered:  Chronic hypertension, this will need to be gradually treated and I expect this to come down slowly.  He is not tachycardic febrile or hypoxic.  He has known CD4 decrease secondary to untreated HIV, he is in the process of meeting with infectious disease and getting back on medications. HIV positive         Final Clinical Impression(s) / ED Diagnoses Final diagnoses:  Essential hypertension    Rx / DC Orders ED Discharge Orders          Ordered    metoprolol  tartrate (LOPRESSOR) 25 MG tablet  2 times daily        10/26/23 1529              Eber Hong, MD 10/26/23 1530

## 2023-10-26 NOTE — Discharge Instructions (Addendum)
 Please take them until all twice a day, this will help with your blood pressure, you will need to drink plenty of clear liquids and have your kidney function rechecked by your doctor within 1 week.  You should have your blood pressure checked at that visit and they can adjust her medications at that time  Please avoid using ibuprofen as this can cause your blood pressure go up, avoid tobacco and alcohol as this can make it go up as well.  Thank you for allowing [place] to treat you in the emergency department today.  After reviewing your examination and potential testing that was done it appears that you are safe to go home.  I would like for you to follow-up with your doctor within the next several days, have them obtain your records and follow-up with them to review all potential tests and results from your visit.  If you should develop severe or worsening symptoms return to the emergency department immediately

## 2023-10-26 NOTE — ED Triage Notes (Addendum)
 Pt c.o HTN, went to his ID clinic and it was high, was told to come here. C.o nausea and back pain x2 weeks

## 2023-10-26 NOTE — ED Provider Triage Note (Signed)
 Emergency Medicine Provider Triage Evaluation Note  Juan Townsend , a 48 y.o. male  was evaluated in triage.  Pt complains of high blood pressure.  Pt has has not been on medication for over a year  Review of Systems  Positive: Chest pain  Negative: fever  Physical Exam  BP (!) 182/124 (BP Location: Left Arm)   Pulse (!) 105   Temp 99.2 F (37.3 C) (Oral)   Resp 20   SpO2 97%  Gen:   Awake, no distress   Resp:  Normal effort  MSK:   Moves extremities without difficulty  Other:    Medical Decision Making  Medically screening exam initiated at 1:10 PM.  Appropriate orders placed.  Juan Townsend was informed that the remainder of the evaluation will be completed by another provider, this initial triage assessment does not replace that evaluation, and the importance of remaining in the ED until their evaluation is complete.     Elson Areas, [place] 10/26/23 1311

## 2023-10-26 NOTE — ED Notes (Signed)
Pt denies blurred vision, HA, & dizziness

## 2023-11-08 ENCOUNTER — Ambulatory Visit: Payer: Self-pay | Admitting: Family

## 2024-02-18 ENCOUNTER — Emergency Department (HOSPITAL_COMMUNITY): Payer: Self-pay

## 2024-02-18 ENCOUNTER — Other Ambulatory Visit (HOSPITAL_COMMUNITY): Payer: Self-pay

## 2024-02-18 ENCOUNTER — Inpatient Hospital Stay (HOSPITAL_COMMUNITY): Payer: MEDICAID

## 2024-02-18 ENCOUNTER — Emergency Department (HOSPITAL_COMMUNITY): Payer: MEDICAID

## 2024-02-18 ENCOUNTER — Inpatient Hospital Stay (HOSPITAL_COMMUNITY): Payer: Self-pay

## 2024-02-18 ENCOUNTER — Encounter (HOSPITAL_COMMUNITY)
Admission: EM | Disposition: A | Discharge status: Rehabilitation Facility | Payer: Self-pay | Source: Home / Self Care | Attending: Internal Medicine

## 2024-02-18 ENCOUNTER — Inpatient Hospital Stay (HOSPITAL_COMMUNITY)
Admission: EM | Admit: 2024-02-18 | Discharge: 2024-02-25 | DRG: 064 | Disposition: A | Discharge status: Rehabilitation Facility | Payer: MEDICAID | Attending: Internal Medicine | Admitting: Internal Medicine

## 2024-02-18 ENCOUNTER — Encounter (HOSPITAL_COMMUNITY): Payer: Self-pay

## 2024-02-18 DIAGNOSIS — E785 Hyperlipidemia, unspecified: Secondary | ICD-10-CM

## 2024-02-18 DIAGNOSIS — J3801 Paralysis of vocal cords and larynx, unilateral: Secondary | ICD-10-CM | POA: Diagnosis present

## 2024-02-18 DIAGNOSIS — Z7902 Long term (current) use of antithrombotics/antiplatelets: Secondary | ICD-10-CM

## 2024-02-18 DIAGNOSIS — R29810 Facial weakness: Secondary | ICD-10-CM | POA: Diagnosis present

## 2024-02-18 DIAGNOSIS — T447X6A Underdosing of beta-adrenoreceptor antagonists, initial encounter: Secondary | ICD-10-CM | POA: Diagnosis present

## 2024-02-18 DIAGNOSIS — F17210 Nicotine dependence, cigarettes, uncomplicated: Secondary | ICD-10-CM | POA: Diagnosis present

## 2024-02-18 DIAGNOSIS — Z8673 Personal history of transient ischemic attack (TIA), and cerebral infarction without residual deficits: Secondary | ICD-10-CM | POA: Diagnosis not present

## 2024-02-18 DIAGNOSIS — R531 Weakness: Principal | ICD-10-CM

## 2024-02-18 DIAGNOSIS — Z79899 Other long term (current) drug therapy: Secondary | ICD-10-CM

## 2024-02-18 DIAGNOSIS — R1310 Dysphagia, unspecified: Secondary | ICD-10-CM | POA: Diagnosis present

## 2024-02-18 DIAGNOSIS — N1831 Chronic kidney disease, stage 3a: Secondary | ICD-10-CM | POA: Diagnosis present

## 2024-02-18 DIAGNOSIS — D638 Anemia in other chronic diseases classified elsewhere: Secondary | ICD-10-CM | POA: Diagnosis present

## 2024-02-18 DIAGNOSIS — G8194 Hemiplegia, unspecified affecting left nondominant side: Secondary | ICD-10-CM | POA: Diagnosis present

## 2024-02-18 DIAGNOSIS — E871 Hypo-osmolality and hyponatremia: Secondary | ICD-10-CM | POA: Diagnosis present

## 2024-02-18 DIAGNOSIS — D696 Thrombocytopenia, unspecified: Secondary | ICD-10-CM | POA: Diagnosis present

## 2024-02-18 DIAGNOSIS — R471 Dysarthria and anarthria: Secondary | ICD-10-CM | POA: Diagnosis not present

## 2024-02-18 DIAGNOSIS — R4701 Aphasia: Secondary | ICD-10-CM | POA: Diagnosis not present

## 2024-02-18 DIAGNOSIS — I6389 Other cerebral infarction: Secondary | ICD-10-CM

## 2024-02-18 DIAGNOSIS — G8114 Spastic hemiplegia affecting left nondominant side: Secondary | ICD-10-CM

## 2024-02-18 DIAGNOSIS — R29705 NIHSS score 5: Secondary | ICD-10-CM | POA: Diagnosis present

## 2024-02-18 DIAGNOSIS — B20 Human immunodeficiency virus [HIV] disease: Secondary | ICD-10-CM | POA: Diagnosis present

## 2024-02-18 DIAGNOSIS — I129 Hypertensive chronic kidney disease with stage 1 through stage 4 chronic kidney disease, or unspecified chronic kidney disease: Secondary | ICD-10-CM | POA: Diagnosis present

## 2024-02-18 DIAGNOSIS — Z781 Physical restraint status: Secondary | ICD-10-CM | POA: Diagnosis not present

## 2024-02-18 DIAGNOSIS — N179 Acute kidney failure, unspecified: Secondary | ICD-10-CM | POA: Diagnosis present

## 2024-02-18 DIAGNOSIS — I639 Cerebral infarction, unspecified: Secondary | ICD-10-CM | POA: Diagnosis present

## 2024-02-18 DIAGNOSIS — I6381 Other cerebral infarction due to occlusion or stenosis of small artery: Secondary | ICD-10-CM

## 2024-02-18 DIAGNOSIS — E7800 Pure hypercholesterolemia, unspecified: Secondary | ICD-10-CM | POA: Diagnosis present

## 2024-02-18 DIAGNOSIS — I63521 Cerebral infarction due to unspecified occlusion or stenosis of right anterior cerebral artery: Secondary | ICD-10-CM | POA: Diagnosis not present

## 2024-02-18 DIAGNOSIS — I160 Hypertensive urgency: Secondary | ICD-10-CM | POA: Diagnosis present

## 2024-02-18 DIAGNOSIS — M311 Thrombotic microangiopathy, unspecified: Secondary | ICD-10-CM | POA: Diagnosis present

## 2024-02-18 DIAGNOSIS — E876 Hypokalemia: Secondary | ICD-10-CM | POA: Diagnosis not present

## 2024-02-18 DIAGNOSIS — R296 Repeated falls: Secondary | ICD-10-CM | POA: Diagnosis present

## 2024-02-18 DIAGNOSIS — I63522 Cerebral infarction due to unspecified occlusion or stenosis of left anterior cerebral artery: Secondary | ICD-10-CM

## 2024-02-18 DIAGNOSIS — I10 Essential (primary) hypertension: Secondary | ICD-10-CM

## 2024-02-18 DIAGNOSIS — Z7982 Long term (current) use of aspirin: Secondary | ICD-10-CM

## 2024-02-18 DIAGNOSIS — Z7409 Other reduced mobility: Secondary | ICD-10-CM | POA: Diagnosis present

## 2024-02-18 HISTORY — DX: Essential (primary) hypertension: I10

## 2024-02-18 HISTORY — DX: Cerebral infarction, unspecified: I63.9

## 2024-02-18 LAB — COMPREHENSIVE METABOLIC PANEL WITH GFR
ALT: 16 U/L (ref 0–44)
ALT: 16 U/L (ref 0–44)
AST: 39 U/L (ref 15–41)
AST: 37 U/L (ref 15–41)
Albumin: 4.1 g/dL (ref 3.5–5.0)
Albumin: 4 g/dL (ref 3.5–5.0)
Alkaline Phosphatase: 60 U/L (ref 38–126)
Alkaline Phosphatase: 54 U/L (ref 38–126)
Anion gap: 12 (ref 5–15)
Anion gap: 13 (ref 5–15)
BUN: 46 mg/dL — ABNORMAL HIGH (ref 6–20)
BUN: 45 mg/dL — ABNORMAL HIGH (ref 6–20)
CO2: 26 mmol/L (ref 22–32)
CO2: 24 mmol/L (ref 22–32)
Calcium: 9.6 mg/dL (ref 8.9–10.3)
Calcium: 9.3 mg/dL (ref 8.9–10.3)
Chloride: 94 mmol/L — ABNORMAL LOW (ref 98–111)
Chloride: 96 mmol/L — ABNORMAL LOW (ref 98–111)
Creatinine, Ser: 2.75 mg/dL — ABNORMAL HIGH (ref 0.61–1.24)
Creatinine, Ser: 2.51 mg/dL — ABNORMAL HIGH (ref 0.61–1.24)
GFR, Estimated: 28 mL/min — ABNORMAL LOW (ref >60)
GFR, Estimated: 31 mL/min — ABNORMAL LOW (ref >60)
Glucose, Bld: 109 mg/dL — ABNORMAL HIGH (ref 70–99)
Glucose, Bld: 117 mg/dL — ABNORMAL HIGH (ref 70–99)
Potassium: 3.8 mmol/L (ref 3.5–5.1)
Potassium: 3.6 mmol/L (ref 3.5–5.1)
Sodium: 132 mmol/L — ABNORMAL LOW (ref 135–145)
Sodium: 133 mmol/L — ABNORMAL LOW (ref 135–145)
Total Bilirubin: 2 mg/dL — ABNORMAL HIGH (ref 0.0–1.2)
Total Bilirubin: 1.8 mg/dL — ABNORMAL HIGH (ref 0.0–1.2)
Total Protein: 9.1 g/dL — ABNORMAL HIGH (ref 6.5–8.1)
Total Protein: 8.9 g/dL — ABNORMAL HIGH (ref 6.5–8.1)

## 2024-02-18 LAB — ECHOCARDIOGRAM COMPLETE
Area-P 1/2: 3.72 cm2
S' Lateral: 2.2 cm
Weight: 2074.09 [oz_av]

## 2024-02-18 LAB — I-STAT CHEM 8, ED
BUN: 46 mg/dL — ABNORMAL HIGH (ref 6–20)
Calcium, Ion: 1.08 mmol/L — ABNORMAL LOW (ref 1.15–1.40)
Chloride: 96 mmol/L — ABNORMAL LOW (ref 98–111)
Creatinine, Ser: 2.8 mg/dL — ABNORMAL HIGH (ref 0.61–1.24)
Glucose, Bld: 110 mg/dL — ABNORMAL HIGH (ref 70–99)
HCT: 38 % — ABNORMAL LOW (ref 39.0–52.0)
Hemoglobin: 12.9 g/dL — ABNORMAL LOW (ref 13.0–17.0)
Potassium: 3.8 mmol/L (ref 3.5–5.1)
Sodium: 133 mmol/L — ABNORMAL LOW (ref 135–145)
TCO2: 27 mmol/L (ref 22–32)

## 2024-02-18 LAB — URINALYSIS, ROUTINE W REFLEX MICROSCOPIC
Bilirubin Urine: NEGATIVE
Glucose, UA: 50 mg/dL — AB
Ketones, ur: NEGATIVE mg/dL
Leukocytes,Ua: NEGATIVE
Nitrite: NEGATIVE
Protein, ur: 30 mg/dL — AB
Specific Gravity, Urine: 1.009 (ref 1.005–1.030)
pH: 7 (ref 5.0–8.0)

## 2024-02-18 LAB — RAPID URINE DRUG SCREEN, HOSP PERFORMED
Amphetamines: NOT DETECTED
Barbiturates: NOT DETECTED
Benzodiazepines: NOT DETECTED
Cocaine: NOT DETECTED
Opiates: NOT DETECTED
Tetrahydrocannabinol: POSITIVE — AB

## 2024-02-18 LAB — CBC
HCT: 32.5 % — ABNORMAL LOW (ref 39.0–52.0)
HCT: 34.8 % — ABNORMAL LOW (ref 39.0–52.0)
Hemoglobin: 12 g/dL — ABNORMAL LOW (ref 13.0–17.0)
Hemoglobin: 12.3 g/dL — ABNORMAL LOW (ref 13.0–17.0)
MCH: 30.3 pg (ref 26.0–34.0)
MCH: 31.8 pg (ref 26.0–34.0)
MCHC: 35.3 g/dL (ref 30.0–36.0)
MCHC: 36.9 g/dL — ABNORMAL HIGH (ref 30.0–36.0)
MCV: 85.7 fL (ref 80.0–100.0)
MCV: 86.2 fL (ref 80.0–100.0)
Platelets: 51 10*3/uL — ABNORMAL LOW (ref 150–400)
Platelets: 81 10*3/uL — ABNORMAL LOW (ref 150–400)
RBC: 3.77 MIL/uL — ABNORMAL LOW (ref 4.22–5.81)
RBC: 4.06 MIL/uL — ABNORMAL LOW (ref 4.22–5.81)
RDW: 12.8 % (ref 11.5–15.5)
RDW: 13 % (ref 11.5–15.5)
WBC: 7.5 10*3/uL (ref 4.0–10.5)
WBC: 9.6 10*3/uL (ref 4.0–10.5)
nRBC: 0 % (ref 0.0–0.2)
nRBC: 0 % (ref 0.0–0.2)

## 2024-02-18 LAB — DIFFERENTIAL
Abs Immature Granulocytes: 0.04 10*3/uL (ref 0.00–0.07)
Basophils Absolute: 0.1 10*3/uL (ref 0.0–0.1)
Basophils Relative: 1 %
Eosinophils Absolute: 0 10*3/uL (ref 0.0–0.5)
Eosinophils Relative: 0 %
Immature Granulocytes: 1 %
Lymphocytes Relative: 12 %
Lymphs Abs: 0.9 10*3/uL (ref 0.7–4.0)
Monocytes Absolute: 0.8 10*3/uL (ref 0.1–1.0)
Monocytes Relative: 10 %
Neutro Abs: 5.7 10*3/uL (ref 1.7–7.7)
Neutrophils Relative %: 76 %

## 2024-02-18 LAB — GLUCOSE, CAPILLARY: Glucose-Capillary: 132 mg/dL — ABNORMAL HIGH (ref 70–99)

## 2024-02-18 LAB — PROTIME-INR
INR: 1.2 (ref 0.8–1.2)
Prothrombin Time: 14.9 s (ref 11.4–15.2)

## 2024-02-18 LAB — ETHANOL: Alcohol, Ethyl (B): <15 mg/dL (ref <15)

## 2024-02-18 LAB — LACTIC ACID, PLASMA: Lactic Acid, Venous: 1.5 mmol/L (ref 0.5–1.9)

## 2024-02-18 LAB — APTT: aPTT: 32 s (ref 24–36)

## 2024-02-18 LAB — CBG MONITORING, ED: Glucose-Capillary: 110 mg/dL — ABNORMAL HIGH (ref 70–99)

## 2024-02-18 LAB — PHOSPHORUS: Phosphorus: 3.9 mg/dL (ref 2.5–4.6)

## 2024-02-18 LAB — MAGNESIUM: Magnesium: 2.1 mg/dL (ref 1.7–2.4)

## 2024-02-18 LAB — BRAIN NATRIURETIC PEPTIDE: B Natriuretic Peptide: 216.2 pg/mL — ABNORMAL HIGH (ref 0.0–100.0)

## 2024-02-18 SURGERY — RADIOLOGY WITH ANESTHESIA
Anesthesia: Choice

## 2024-02-18 MED ORDER — METOPROLOL TARTRATE 25 MG PO TABS
25.0000 mg | ORAL_TABLET | Freq: Two times a day (BID) | ORAL | Status: DC
  Administered 2024-02-18 – 2024-02-19 (×3): 25 mg via ORAL
  Filled 2024-02-18 (×3): qty 1

## 2024-02-18 MED ORDER — ORAL CARE MOUTH RINSE: 15.0000 mL | OROMUCOSAL | Status: DC | PRN

## 2024-02-18 MED ORDER — IOHEXOL 350 MG/ML SOLN
100.0000 mL | Freq: Once | INTRAVENOUS | Status: AC | PRN
  Administered 2024-02-18: 100 mL via INTRAVENOUS

## 2024-02-18 MED ORDER — ACETAMINOPHEN 325 MG PO TABS
650.0000 mg | ORAL_TABLET | ORAL | Status: AC | PRN
Start: 2024-02-18 — End: ?
  Administered 2024-02-23: 650 mg via ORAL
  Filled 2024-02-18: qty 2

## 2024-02-18 MED ORDER — POLYETHYLENE GLYCOL 3350 17 G PO PACK
17.0000 g | PACK | Freq: Every day | ORAL | Status: DC | PRN
  Administered 2024-02-25: 17 g via ORAL
  Filled 2024-02-18: qty 1

## 2024-02-18 MED ORDER — OLANZAPINE 10 MG IM SOLR
2.5000 mg | Freq: Once | INTRAMUSCULAR | Status: AC | PRN
  Administered 2024-02-18: 2.5 mg via INTRAMUSCULAR
  Filled 2024-02-18: qty 10

## 2024-02-18 MED ORDER — LABETALOL HCL 5 MG/ML IV SOLN
10.0000 mg | Freq: Once | INTRAVENOUS | Status: AC
  Administered 2024-02-18: 10 mg via INTRAVENOUS

## 2024-02-18 MED ORDER — STERILE WATER FOR INJECTION IJ SOLN
INTRAMUSCULAR | Status: AC
  Filled 2024-02-18: qty 10

## 2024-02-18 MED ORDER — SODIUM CHLORIDE 0.9 % NICU IV INFUSION SIMPLE
1000.0000 mL | INJECTION | Freq: Once | INTRAVENOUS | Status: AC
  Administered 2024-02-18: 1000 mL via INTRAVENOUS
  Filled 2024-02-18: qty 1000

## 2024-02-18 MED ORDER — SODIUM CHLORIDE 0.9% FLUSH: 3.0000 mL | Freq: Once | INTRAVENOUS | Status: DC

## 2024-02-18 MED ORDER — ASPIRIN 325 MG PO TBEC
325.0000 mg | DELAYED_RELEASE_TABLET | Freq: Every day | ORAL | Status: DC
  Administered 2024-02-19: 325 mg via ORAL
  Filled 2024-02-18: qty 1

## 2024-02-18 MED ORDER — CHLORHEXIDINE GLUCONATE CLOTH 2 % EX PADS
6.0000 | MEDICATED_PAD | Freq: Every day | CUTANEOUS | Status: DC
  Administered 2024-02-18 – 2024-02-22 (×5): 6 via TOPICAL

## 2024-02-18 MED ORDER — DOCUSATE SODIUM 100 MG PO CAPS: 100.0000 mg | ORAL_CAPSULE | Freq: Two times a day (BID) | ORAL | Status: DC | PRN

## 2024-02-18 MED ORDER — SODIUM CHLORIDE 0.9 % IV SOLN: INTRAVENOUS | Status: AC

## 2024-02-18 MED ORDER — HYDRALAZINE HCL 20 MG/ML IJ SOLN
5.0000 mg | INTRAMUSCULAR | Status: DC | PRN
  Administered 2024-02-19 – 2024-02-22 (×4): 5 mg via INTRAVENOUS
  Filled 2024-02-18 (×4): qty 1

## 2024-02-18 MED ORDER — CLEVIDIPINE BUTYRATE 0.5 MG/ML IV EMUL
0.0000 mg/h | INTRAVENOUS | Status: DC
  Administered 2024-02-18: 16 mg/h via INTRAVENOUS
  Administered 2024-02-18: 2 mg/h via INTRAVENOUS
  Administered 2024-02-18: 21 mg/h via INTRAVENOUS
  Administered 2024-02-18: 18 mg/h via INTRAVENOUS
  Administered 2024-02-19: 21 mg/h via INTRAVENOUS
  Administered 2024-02-19: 13 mg/h via INTRAVENOUS
  Administered 2024-02-19: 11 mg/h via INTRAVENOUS
  Administered 2024-02-19: 13 mg/h via INTRAVENOUS
  Administered 2024-02-19 – 2024-02-20 (×2): 9 mg/h via INTRAVENOUS
  Filled 2024-02-18 (×10): qty 100
  Filled 2024-02-18: qty 50

## 2024-02-18 MED ORDER — BICTEGRAVIR-EMTRICITAB-TENOFOV 50-200-25 MG PO TABS
1.0000 | ORAL_TABLET | Freq: Every day | ORAL | Status: DC
  Administered 2024-02-19 – 2024-02-25 (×6): 1 via ORAL
  Filled 2024-02-18 (×7): qty 1

## 2024-02-18 NOTE — Plan of Care (Signed)
  Problem: Ischemic Stroke/TIA Tissue Perfusion: Goal: Complications of ischemic stroke/TIA will be minimized Outcome: Progressing   Problem: Self-Care: Goal: Ability to participate in self-care as condition permits will improve Outcome: Progressing Goal: Verbalization of feelings and concerns over difficulty with self-care will improve Outcome: Progressing Goal: Ability to communicate needs accurately will improve Outcome: Progressing   Problem: Nutrition: Goal: Risk of aspiration will decrease Outcome: Progressing Goal: Dietary intake will improve Outcome: Progressing
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
## 2024-02-18 NOTE — Progress Notes (Signed)
 GFR resulted at 31. Dr. Lindzen notified.

## 2024-02-18 NOTE — Progress Notes (Signed)
  GFR still pending as of this time.
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
## 2024-02-18 NOTE — Sedation Documentation (Addendum)
 Notified of pt going to MRI for code stroke scan

## 2024-02-18 NOTE — Progress Notes (Signed)
 eLink Physician-Brief Progress Note Patient Name: Islam Eichinger DOB: 28-Nov-1975 MRN: [id number]   Date of Service  02/18/2024  HPI/Events of Note  Mild delirium.  eICU Interventions  Posey + right wrist restraint ordered for patient safety.        Atanacio Melnyk U Chane Magner 02/18/2024, 8:55 PM
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
## 2024-02-18 NOTE — Progress Notes (Signed)
 Unable to safely perform MRI per radiologist. Pt taken from MRI back to ED. Per Neurologist, plan to give IVF then recheck creatinine at 1500. Call results to Dr Renaee Caro. Decision will be made at that time regarding CTA.   Cleviprex gtt initiated upon pts return to ED. Titrating to goal < 220/110.

## 2024-02-18 NOTE — ED Provider Notes (Signed)
 [place] EMERGENC[facility] DEPARTMENT AT [facility] Provider Note  CSN: [id number] Arrival date & time: 02/18/24 1303  Chief Complaint(s) No chief complaint on file.  HPI Juan Townsend is a 48 y.o. male history of prior stroke, hypertension, hyperlipidemia, HIV presenting to the emergency department with weakness.  Patient reports left sided weakness since yesterday, around 8 PM.  Has had multiple falls since then.  Not sure if he hit his head.  Denies chest pain or back pain, pain in arms or legs.  No fevers or chills.  Past Medical History Past Medical History:  Diagnosis Date   HIV (human immunodeficiency virus infection) (HCC)    Hypercholesteremia    Hypertension    Stroke (HCC)    There are no active problems to display for this patient.  Home Medication(s) Prior to Admission medications   Medication Sig Start Date End Date Taking? Authorizing Provider  aspirin 325 MG tablet Take 325 mg by mouth daily. Patient not taking: Reported on 10/17/2023    [provider]  bictegravir-emtricitabine-tenofovir AF (BIKTARVY) 50-200-25 MG TABS tablet Take 1 tablet by mouth daily. Try to take at the same time each day with or without food. 10/24/23   Orlie Bjornstad, MD  gabapentin  (NEURONTIN ) 300 MG capsule 1 tab po at bedtime on day 1, 1 tablet bid on day 2, then 1 tablet tid on day 3. Patient not taking: Reported on 07/18/2020 02/24/20   Mortenson, Ashley, MD  ibuprofen  (ADVIL ) 600 MG tablet Take 1 tablet (600 mg total) by mouth every 6 (six) hours as needed. Patient not taking: Reported on 07/18/2020 02/24/20   Mortenson, Ashley, MD  lisinopril (ZESTRIL) 10 MG tablet Take 10 mg by mouth daily. Patient not taking: Reported on 10/17/2023 11/20/19   [provider]  methylPREDNISolone  (MEDROL  DOSEPAK) 4 MG TBPK tablet Take by mouth daily. Follow package instructions Patient not taking: Reported on 07/18/2020 02/24/20   Mortenson, Ashley, MD  metoprolol  tartrate (LOPRESSOR ) 25 MG  tablet Take 1 tablet (25 mg total) by mouth 2 (two) times daily. 10/26/23   Early Glisson, MD  ondansetron  (ZOFRAN  ODT) 4 MG disintegrating tablet Take 1 tablet (4 mg total) by mouth every 8 (eight) hours as needed for up to 10 doses for nausea or vomiting. Patient not taking: Reported on 10/17/2023 07/18/20   Hugo Maes, MD  tiZANidine  (ZANAFLEX ) 4 MG tablet Take 1 tablet (4 mg total) by mouth every 8 (eight) hours as needed for muscle spasms. Patient not taking: Reported on 07/18/2020 02/24/20   Ethlyn Herd, MD                                                                                                                                    Past Surgical History No past surgical history on file. Family History No family history on file.  Social History Social History   Tobacco Use   Smoking status: Every Day  Tobacco comments:    .75 PPD  Substance Use Topics   Alcohol use: Yes    Alcohol/week: 1.0 standard drink of alcohol    Types: 1 Cans of beer per week    Comment: 1 quart of beer every weekend   Drug use: Yes    Types: Marijuana   Allergies Patient has no known allergies.  Review of Systems Review of Systems  All other systems reviewed and are negative.   Physical Exam Vital Signs  I have reviewed the triage vital signs BP (!) 202/117   Pulse (!) 115   Temp 97.6 F (36.4 C) (Temporal)   Resp (!) 27   Wt 58.8 kg   SpO2 100%   BMI 19.14 kg/m  Physical Exam Vitals and nursing note reviewed.  Constitutional:      General: He is not in acute distress.    Appearance: Normal appearance.  HENT:     Mouth/Throat:     Mouth: Mucous membranes are moist.  Eyes:     Conjunctiva/sclera: Conjunctivae normal.  Cardiovascular:     Rate and Rhythm: Normal rate and regular rhythm.  Pulmonary:     Effort: Pulmonary effort is normal. No respiratory distress.     Breath sounds: Normal breath sounds.  Abdominal:     General: Abdomen is flat.     Palpations: Abdomen is  soft.     Tenderness: There is no abdominal tenderness.  Musculoskeletal:     Right lower leg: No edema.     Left lower leg: No edema.  Skin:    General: Skin is warm and dry.     Capillary Refill: Capillary refill takes less than 2 seconds.  Neurological:     Mental Status: He is alert and oriented to person, place, and time.     Comments: L facial droop, left arm/leg 3/5, right upper lower extremity strength 5/5  Psychiatric:        Mood and Affect: Mood normal.        Behavior: Behavior normal.     ED Results and Treatments Labs (all labs ordered are listed, but only abnormal results are displayed) Labs Reviewed  CBC - Abnormal; Notable for the following components:      Result Value   RBC 4.06 (*)    Hemoglobin 12.3 (*)    HCT 34.8 (*)    Platelets 51 (*)    All other components within normal limits  COMPREHENSIVE METABOLIC PANEL WITH GFR - Abnormal; Notable for the following components:   Sodium 132 (*)    Chloride 94 (*)    Glucose, Bld 109 (*)    BUN 46 (*)    Creatinine, Ser 2.75 (*)    Total Protein 9.1 (*)    Total Bilirubin 2.0 (*)    GFR, Estimated 28 (*)    All other components within normal limits  I-STAT CHEM 8, ED - Abnormal; Notable for the following components:   Sodium 133 (*)    Chloride 96 (*)    BUN 46 (*)    Creatinine, Ser 2.80 (*)    Glucose, Bld 110 (*)    Calcium, Ion 1.08 (*)    Hemoglobin 12.9 (*)    HCT 38.0 (*)    All other components within normal limits  CBG MONITORING, ED - Abnormal; Notable for the following components:   Glucose-Capillary 110 (*)    All other components within normal limits  PROTIME-INR  APTT  DIFFERENTIAL  ETHANOL  COMPREHENSIVE METABOLIC  PANEL WITH GFR                                                                                                                          Radiology CT HEAD CODE STROKE WO CONTRAST Result Date: 02/18/2024 CLINICAL DATA:  Code stroke. Provided history: Neuro deficit, acute,  stroke suspected. EXAM: CT HEAD WITHOUT CONTRAST TECHNIQUE: Contiguous axial images were obtained from the base of the skull through the vertex without intravenous contrast. RADIATION DOSE REDUCTION: This exam was performed according to the departmental dose-optimization program which includes automated exposure control, adjustment of the mA and/or kV according to patient size and/or use of iterative reconstruction technique. COMPARISON:  Head CT 06/25/2019. FINDINGS: Brain: No age-advanced or lobar predominant cerebral atrophy. Chronic lacunar infarct versus prominent perivascular space within the left lentiform nucleus, new from the prior head CT of 06/25/2019 (series 2, image 14) (series 5, image 37). 6 mm focus of hypodensity within the mid left frontal lobe white matter, new from the prior CT (series 2, image 21). Background patchy and ill-defined hypoattenuation within the cerebral white matter, overall moderate severity and greater than expected for age. There is no acute intracranial hemorrhage. No demarcated cortical infarct. No extra-axial fluid collection. No evidence of an intracranial mass. No midline shift. Vascular: No hyperdense vessel.  Atherosclerotic calcifications. Skull: No calvarial fracture or aggressive osseous lesion. Sinuses/Orbits: No mass or acute finding within the imaged orbits. No significant paranasal sinus disease at the imaged levels. ASPECTS ([place] Stroke Program Early CT Score) - Ganglionic level infarction (caudate, lentiform nuclei, internal capsule, insula, M1-M3 cortex): 7 - Supraganglionic infarction (M4-M6 cortex): 3 Total score (0-10 with 10 being normal): 10 (when discounting a potential chronic lacunar infarct within the left basal ganglia). Impressions #1 and #2 communicated to Dr. Renaee Caro at 1:42 pmon 6/10/2025by text page via the [facility] messaging system. IMPRESSION: 1. No acute intracranial hemorrhage or acute demarcated cortical infarction. 2. 6 mm insult within the  mid left frontal lobe white matter, nonspecific but potentially reflecting an age-indeterminate infarct. 3. Chronic lacunar infarct versus prominent perivascular space within the left basal ganglia, also new from the prior CT. 4. Background age-advanced nonspecific cerebral white matter disease, progressed. Electronically Signed   By: Bascom Lily D.O.   On: 02/18/2024 13:44    Pertinent labs & imaging results that were available during my care of the patient were reviewed by me and considered in my medical decision making (see MDM for details).  Medications Ordered in ED Medications  sodium chloride flush (NS) 0.9 % injection 3 mL (3 mLs Intravenous Not Given 02/18/24 1356)  clevidipine (CLEVIPREX) infusion 0.5 mg/mL (2 mg/hr Intravenous New Bag/Given 02/18/24 1349)  0.9 %  sodium chloride infusion (has no administration in time range)  labetalol (NORMODYNE) injection 10 mg (10 mg Intravenous Given by Other 02/18/24 1320)  sodium chloride 0.9 % 1,000 mL IV infusion (0 mLs Intravenous Stopped 02/18/24 1508)  Procedures .Critical Care  Performed by: Mordecai Applebaum, MD Authorized by: Mordecai Applebaum, MD   Critical care provider statement:    Critical care time (minutes):  30   Critical care was necessary to treat or prevent imminent or life-threatening deterioration of the following conditions:  CNS failure or compromise   Critical care was time spent personally by me on the following activities:  Development of treatment plan with patient or surrogate, discussions with consultants, evaluation of patient's response to treatment, examination of patient, ordering and review of laboratory studies, ordering and review of radiographic studies, ordering and performing treatments and interventions, pulse oximetry, re-evaluation of patient's condition and review of old  charts   Care discussed with: admitting provider     (including critical care time)  Medical Decision Making / ED Course   MDM:  48 year old presenting to the emergency department with weakness.  On exam, patient does have weakness in left upper and left lower extremity.  Does have left-sided facial droop as well.  Concern for stroke.  Patient outside TNK window although this was considered.  Patient noted to have AKI, not able to receive contrast per neurology and concern for LVO lower, so MRI was planned, however patient has indwelling metal shrapnel and not eligible for MRI.  Plan now per neurology is to give fluid and recheck creatinine.  If creatinine has improved above GFR limit then plan for CTA.  Would be possible EVT candidate if CTA shows LVO.  Signed out to Dr. Drury Geralds pending repeat creatinine, admission.  Patient is on Cleviprex drip given his significant hypertension.  Not able to be admitted to hospitalist service so have discussed with ICU who will see the patient.    Clinical Course as of 02/18/24 1542  Tue Feb 18, 2024  1421 CT Cervical Spine Wo Contrast [WS]  1539 Creatinine(!): 2.75 +AKI [HN]  1540 CT HEAD CODE STROKE WO CONTRAST 1. No acute intracranial hemorrhage or acute demarcated cortical infarction. 2. 6 mm insult within the mid left frontal lobe white matter, nonspecific but potentially reflecting an age-indeterminate infarct. 3. Chronic lacunar infarct versus prominent perivascular space within the left basal ganglia, also new from the prior CT. 4. Background age-advanced nonspecific cerebral white matter disease, progressed.   [HN]    Clinical Course User Index [HN] Merdis Stalling, MD [WS] Mordecai Applebaum, MD     Additional history obtained: -Additional history obtained from ems -External records from outside source obtained and reviewed including: Chart review including previous notes, labs, imaging, consultation notes including prior notes     Lab Tests: -I ordered, reviewed, and interpreted labs.   The pertinent results include:   Labs Reviewed  CBC - Abnormal; Notable for the following components:      Result Value   RBC 4.06 (*)    Hemoglobin 12.3 (*)    HCT 34.8 (*)    Platelets 51 (*)    All other components within normal limits  COMPREHENSIVE METABOLIC PANEL WITH GFR - Abnormal; Notable for the following components:   Sodium 132 (*)    Chloride 94 (*)    Glucose, Bld 109 (*)    BUN 46 (*)    Creatinine, Ser 2.75 (*)    Total Protein 9.1 (*)    Total Bilirubin 2.0 (*)    GFR, Estimated 28 (*)    All other components within normal limits  I-STAT CHEM 8, ED - Abnormal; Notable for the following components:   Sodium 133 (*)  Chloride 96 (*)    BUN 46 (*)    Creatinine, Ser 2.80 (*)    Glucose, Bld 110 (*)    Calcium, Ion 1.08 (*)    Hemoglobin 12.9 (*)    HCT 38.0 (*)    All other components within normal limits  CBG MONITORING, ED - Abnormal; Notable for the following components:   Glucose-Capillary 110 (*)    All other components within normal limits  PROTIME-INR  APTT  DIFFERENTIAL  ETHANOL  COMPREHENSIVE METABOLIC PANEL WITH GFR    Notable for AKI  EKG   EKG Interpretation Date/Time:  Tuesday February 18 2024 13:47:32 EDT Ventricular Rate:  80 PR Interval:  139 QRS Duration:  81 QT Interval:  401 QTC Calculation: 463 R Axis:   36  Text Interpretation: Sinus rhythm Left atrial enlargement LVH with secondary repolarization abnormality Anterior ST elevation, probably due to LVH No significant change since last tracing Confirmed by Hiawatha Lout ([id number]) on 02/18/2024 3:19:42 PM         Imaging Studies ordered: I ordered imaging studies including CT heaad On my interpretation imaging demonstrates no ICH I independently visualized and interpreted imaging. I agree with the radiologist interpretation   Medicines ordered and prescription drug management: Meds ordered this encounter   Medications   sodium chloride flush (NS) 0.9 % injection 3 mL   labetalol (NORMODYNE) injection 10 mg   clevidipine (CLEVIPREX) infusion 0.5 mg/mL   sodium chloride 0.9 % 1,000 mL IV infusion   0.9 %  sodium chloride infusion    -I have reviewed the patients home medicines and have made adjustments as needed   Consultations Obtained: I requested consultation with the neurologist,  and discussed lab and imaging findings as well as pertinent plan - they recommend: repeat creatinine, CTA    Reevaluation: After the interventions noted above, I reevaluated the patient and found that their symptoms have stayed the same  Co morbidities that complicate the patient evaluation  Past Medical History:  Diagnosis Date   HIV (human immunodeficiency virus infection) (HCC)    Hypercholesteremia    Hypertension    Stroke [facility])       Dispostion: Disposition decision including need for hospitalization was considered, and patient disposition pending at time of sign out.    Final Clinical Impression(s) / ED Diagnoses Final diagnoses:  Left-sided weakness     This chart was dictated using voice recognition software.  Despite best efforts to proofread,  errors can occur which can change the documentation meaning.    Mordecai Applebaum, MD 02/18/24 [phone]

## 2024-02-18 NOTE — ED Triage Notes (Signed)
 Pt bib ems from home as code stroke; LKW 1950 last night; c/o LL and LA weakness, and blurred vision in L eye that began around 2000 last night; endorses multiple mechanical falls since symptoms began; endorses hx HTN and stroke 8 years ago, no residual deficits; denies hitting head during falls; denies blood thinner use; denies HA; c collar in place; bp 250/170, HR 92, 100% RA; 18 ga RAC

## 2024-02-18 NOTE — ED Notes (Signed)
 Pt to MRI

## 2024-02-18 NOTE — Progress Notes (Signed)
  Echocardiogram 2D Echocardiogram has been performed.  Dione Franks 02/18/2024, 6:37 PM

## 2024-02-18 NOTE — Progress Notes (Signed)
 Per Neurologist, no LVO on imaging.

## 2024-02-18 NOTE — Progress Notes (Signed)
 eLink Physician-Brief Progress Note Patient Name: Juan Townsend DOB: 1976-01-09 MRN: [id number]   Date of Service  02/18/2024  HPI/Events of Note  Patient with recurrence of agitated delirium.  eICU Interventions  Zyprexa 2.5 mg IM x 1 ordered PRN agitated delirium.        Adaleah Forget U Aneyah Lortz 02/18/2024, 9:59 PM

## 2024-02-19 ENCOUNTER — Inpatient Hospital Stay (HOSPITAL_COMMUNITY): Payer: MEDICAID

## 2024-02-19 ENCOUNTER — Other Ambulatory Visit: Payer: Self-pay

## 2024-02-19 ENCOUNTER — Encounter (HOSPITAL_COMMUNITY): Payer: Self-pay | Admitting: Internal Medicine

## 2024-02-19 DIAGNOSIS — F17210 Nicotine dependence, cigarettes, uncomplicated: Secondary | ICD-10-CM

## 2024-02-19 DIAGNOSIS — N189 Chronic kidney disease, unspecified: Secondary | ICD-10-CM

## 2024-02-19 DIAGNOSIS — R531 Weakness: Secondary | ICD-10-CM

## 2024-02-19 DIAGNOSIS — R29714 NIHSS score 14: Secondary | ICD-10-CM

## 2024-02-19 DIAGNOSIS — I63511 Cerebral infarction due to unspecified occlusion or stenosis of right middle cerebral artery: Secondary | ICD-10-CM

## 2024-02-19 DIAGNOSIS — F1210 Cannabis abuse, uncomplicated: Secondary | ICD-10-CM

## 2024-02-19 DIAGNOSIS — I6340 Cerebral infarction due to embolism of unspecified cerebral artery: Secondary | ICD-10-CM

## 2024-02-19 DIAGNOSIS — I63521 Cerebral infarction due to unspecified occlusion or stenosis of right anterior cerebral artery: Secondary | ICD-10-CM

## 2024-02-19 DIAGNOSIS — I160 Hypertensive urgency: Secondary | ICD-10-CM

## 2024-02-19 DIAGNOSIS — N179 Acute kidney failure, unspecified: Secondary | ICD-10-CM

## 2024-02-19 LAB — BASIC METABOLIC PANEL WITH GFR
Anion gap: 14 (ref 5–15)
BUN: 35 mg/dL — ABNORMAL HIGH (ref 6–20)
CO2: 22 mmol/L (ref 22–32)
Calcium: 8.9 mg/dL (ref 8.9–10.3)
Chloride: 95 mmol/L — ABNORMAL LOW (ref 98–111)
Creatinine, Ser: 2.2 mg/dL — ABNORMAL HIGH (ref 0.61–1.24)
GFR, Estimated: 36 mL/min — ABNORMAL LOW (ref >60)
Glucose, Bld: 105 mg/dL — ABNORMAL HIGH (ref 70–99)
Potassium: 3.9 mmol/L (ref 3.5–5.1)
Sodium: 131 mmol/L — ABNORMAL LOW (ref 135–145)

## 2024-02-19 LAB — LACTIC ACID, PLASMA: Lactic Acid, Venous: 1.7 mmol/L (ref 0.5–1.9)

## 2024-02-19 LAB — LIPID PANEL
Cholesterol: 165 mg/dL (ref 0–200)
HDL: 26 mg/dL — ABNORMAL LOW (ref >40)
LDL Cholesterol: 99 mg/dL (ref 0–99)
Total CHOL/HDL Ratio: 6.3 ratio
Triglycerides: 201 mg/dL — ABNORMAL HIGH (ref <150)
VLDL: 40 mg/dL (ref 0–40)

## 2024-02-19 LAB — GLUCOSE, CAPILLARY
Glucose-Capillary: 107 mg/dL — ABNORMAL HIGH (ref 70–99)
Glucose-Capillary: 108 mg/dL — ABNORMAL HIGH (ref 70–99)
Glucose-Capillary: 109 mg/dL — ABNORMAL HIGH (ref 70–99)
Glucose-Capillary: 111 mg/dL — ABNORMAL HIGH (ref 70–99)
Glucose-Capillary: 117 mg/dL — ABNORMAL HIGH (ref 70–99)
Glucose-Capillary: 119 mg/dL — ABNORMAL HIGH (ref 70–99)
Glucose-Capillary: 143 mg/dL — ABNORMAL HIGH (ref 70–99)

## 2024-02-19 LAB — PHOSPHORUS: Phosphorus: 4.4 mg/dL (ref 2.5–4.6)

## 2024-02-19 LAB — CBC
HCT: 33.8 % — ABNORMAL LOW (ref 39.0–52.0)
Hemoglobin: 12 g/dL — ABNORMAL LOW (ref 13.0–17.0)
MCH: 30.5 pg (ref 26.0–34.0)
MCHC: 35.5 g/dL (ref 30.0–36.0)
MCV: 86 fL (ref 80.0–100.0)
Platelets: 84 10*3/uL — ABNORMAL LOW (ref 150–400)
RBC: 3.93 MIL/uL — ABNORMAL LOW (ref 4.22–5.81)
RDW: 13 % (ref 11.5–15.5)
WBC: 9.3 10*3/uL (ref 4.0–10.5)
nRBC: 0 % (ref 0.0–0.2)

## 2024-02-19 LAB — T-HELPER CELLS (CD4) COUNT (NOT AT ARMC)
CD4 % Helper T Cell: 24 % — ABNORMAL LOW (ref 33–65)
CD4 T Cell Abs: 291 /uL — ABNORMAL LOW (ref 400–1790)

## 2024-02-19 LAB — HEMOGLOBIN A1C
Hgb A1c MFr Bld: 4.4 % — ABNORMAL LOW (ref 4.8–5.6)
Mean Plasma Glucose: 79.58 mg/dL

## 2024-02-19 LAB — MAGNESIUM
Magnesium: 2 mg/dL (ref 1.7–2.4)
Magnesium: 2.1 mg/dL (ref 1.7–2.4)

## 2024-02-19 MED ORDER — CLOPIDOGREL BISULFATE 75 MG PO TABS
75.0000 mg | ORAL_TABLET | Freq: Every day | ORAL | Status: DC
  Administered 2024-02-19 – 2024-02-25 (×7): 75 mg via ORAL
  Filled 2024-02-19 (×8): qty 1

## 2024-02-19 MED ORDER — LABETALOL HCL 5 MG/ML IV SOLN
10.0000 mg | INTRAVENOUS | Status: DC | PRN
  Administered 2024-02-19 – 2024-02-21 (×5): 10 mg via INTRAVENOUS
  Filled 2024-02-19 (×4): qty 4

## 2024-02-19 MED ORDER — ASPIRIN 81 MG PO TBEC
81.0000 mg | DELAYED_RELEASE_TABLET | Freq: Every day | ORAL | Status: DC
  Administered 2024-02-20 – 2024-02-25 (×6): 81 mg via ORAL
  Filled 2024-02-19 (×6): qty 1

## 2024-02-19 MED ORDER — AMLODIPINE BESYLATE 5 MG PO TABS
10.0000 mg | ORAL_TABLET | Freq: Every day | ORAL | Status: DC
  Administered 2024-02-19 – 2024-02-25 (×7): 10 mg via ORAL
  Filled 2024-02-19 (×4): qty 2
  Filled 2024-02-19 (×2): qty 1
  Filled 2024-02-19: qty 2

## 2024-02-19 MED ORDER — ENOXAPARIN SODIUM 40 MG/0.4ML IJ SOSY
40.0000 mg | PREFILLED_SYRINGE | INTRAMUSCULAR | Status: DC
  Administered 2024-02-19 – 2024-02-22 (×4): 40 mg via SUBCUTANEOUS
  Filled 2024-02-19 (×4): qty 0.4

## 2024-02-19 MED ORDER — NICOTINE 21 MG/24HR TD PT24
21.0000 mg | MEDICATED_PATCH | Freq: Every day | TRANSDERMAL | Status: DC
  Administered 2024-02-19 – 2024-02-25 (×7): 21 mg via TRANSDERMAL
  Filled 2024-02-19 (×7): qty 1

## 2024-02-19 MED ORDER — ATORVASTATIN CALCIUM 10 MG PO TABS
20.0000 mg | ORAL_TABLET | Freq: Every day | ORAL | Status: DC
  Administered 2024-02-19 – 2024-02-25 (×7): 20 mg via ORAL
  Filled 2024-02-19 (×7): qty 2

## 2024-02-19 NOTE — Progress Notes (Signed)
 Pt was found on floor, sitting in front of his chair by Jaquita Merl, Nurse Manager at [phone]. Pt appeared to have slid down chair onto the ground. Chair alarm was behind Pts back against chair and did not go off until Pt was lifted up. There appeared to be no injuries and Pt did not hit his head. He did not complain of any pain. Pt was repositioned in chair with chair alarm under him around 1350 and nutrition services had left the room approximately 5 minutes before Pt was found on the floor. Pt was placed back into bed, bed alarm on, waist posey was started at this time. Laura Gleason, PA was made aware immediately, she did not deem any imaging necessary.

## 2024-02-19 NOTE — Evaluation (Signed)
 Physical Therapy Evaluation Patient Details Name: Juan Townsend MRN: [id number] DOB: [birth year] Today's Date: 02/19/2024  History of Present Illness  The pt is a 48 yo male presenting 6/10 with left sided weakness, falls, and L eye vision changes. 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 PMH includes: CVA without residual deficits, HIV, hypercholesteremia, and HTN.   Clinical Impression  Pt in bed upon arrival of PT, agreeable to evaluation at this time. Prior to admission the pt was completely independent, living with his brother in a house with 3 steps to enter, working at [employer] center for [employer]. The pt now presents with limitations in functional mobility, ROM, strength, coordination, attention, and stability due to above dx, and will continue to benefit from skilled PT to address these deficits. The pt demos no active ROM to LLE with MMT or command, has 3-5 beats clonus at L ankle, and despite reports of sensation intact, was unable to identify location of touch consistently. Pt dependent on assist of 2 to maintain sitting and standing balance due to L lean, poor awareness, and instability in LLE. Given prior level of independence and current deficits noted, strongly recommend intensive therapies after d/c to facilitate return towards baseline independence and mobility.          If plan is discharge home, recommend the following: Two people to help with walking and/or transfers;Two people to help with bathing/dressing/bathroom;Assistance with cooking/housework;Assistance with feeding;Direct supervision/assist for medications management;Direct supervision/assist for financial management;Assist for transportation;Help with stairs or ramp for entrance;Supervision due to cognitive status   Can travel by private vehicle        Equipment Recommendations Wheelchair (measurements PT);Wheelchair cushion  (measurements PT)  Recommendations for Other Services  Rehab consult;Speech consult    Functional Status Assessment Patient has had a recent decline in their functional status and demonstrates the ability to make significant improvements in function in a reasonable and predictable amount of time.     Precautions / Restrictions Precautions Precautions: Fall Recall of Precautions/Restrictions: Impaired Precaution/Restrictions Comments: permissive HTN Restrictions Weight Bearing Restrictions Per Provider Order: No      Mobility  Bed Mobility Overal bed mobility: Needs Assistance Bed Mobility: Supine to Sit     Supine to sit: Mod assist, +2 for safety/equipment     General bed mobility comments: modA to complete movement of LLE and LUE, pt pulling with RUE to elevate trunk, leaning to L in sitting and needing modA to maintain balance    Transfers Overall transfer level: Needs assistance Equipment used: 2 person hand held assist Transfers: Sit to/from Stand, Bed to chair/wheelchair/BSC Sit to Stand: Mod assist, +2 physical assistance     Squat pivot transfers: Total assist, +2 physical assistance, +2 safety/equipment     General transfer comment: pt completed sit-stand with good initiation in RLE, assist for balance in RUE, but seems to be pushing to L. strong L lean and no activation of LLE so assist to prevent buckle. poor motor planning with attempts at squat pivot    Ambulation/Gait               General Gait Details: unable to stand well enough to steady for stepping      Modified Rankin (Stroke Patients Only) Modified Rankin (Stroke Patients Only) Pre-Morbid Rankin Score: No symptoms Modified Rankin: Severe disability     Balance Overall balance assessment: Needs assistance Sitting-balance support: Single extremity supported, Feet supported  Sitting balance-Leahy Scale: Poor Sitting balance - Comments: L lateral and posterior lean Postural control:  Left lateral lean, Posterior lean Standing balance support: Bilateral upper extremity supported, During functional activity Standing balance-Leahy Scale: Zero Standing balance comment: dependent on external support                             Pertinent Vitals/Pain Pain Assessment Pain Assessment: No/denies pain    Home Living Family/patient expects to be discharged to:: Private residence Living Arrangements: Non-relatives/Friends;Other relatives (brother)   Type of Home: House Home Access: Stairs to enter Entrance Stairs-Rails: None Entrance Stairs-Number of Steps: 3   Home Layout: One level Home Equipment: None      Prior Function Prior Level of Function : Independent/Modified Independent;Working/employed;Driving             Mobility Comments: independent, no other falls, works at [employer], carrying [employer] as part of work duties ADLs Comments: independent     Extremity/Trunk Assessment   Upper Extremity Assessment Upper Extremity Assessment: LUE deficits/detail;Right hand dominant LUE Deficits / Details: Pt with significant LUE weakness, needing increased attention to LUE to activate at all; facilitating composite flexion with sweep tap and pt initiating on R and then L with significantly increased time. pt with no active shoulder shrug. pt In gravity eliminated position able to perofrm shoulder adduction, with trace activation elbow flexion. LUE Sensation: decreased light touch (pt reports it feels normal but questionable report) LUE Coordination: decreased fine motor;decreased gross motor    Lower Extremity Assessment Lower Extremity Assessment: Defer to PT evaluation RLE Deficits / Details: grossly 5/5 to MMT, coordiantion vs motor planning deficits RLE Coordination: decreased gross motor LLE Deficits / Details: no activation to command, 3-5 beats clonus in ankle, increased tone L HS. LLE Sensation: decreased light  touch;decreased proprioception LLE Coordination: decreased fine motor;decreased gross motor    Cervical / Trunk Assessment Cervical / Trunk Assessment: Kyphotic (mild forward rounding of shoulders)  Communication   Communication Communication: Impaired Factors Affecting Communication: Reduced clarity of speech;Difficulty expressing self    Cognition Arousal: Alert Behavior During Therapy: Flat affect   PT - Cognitive impairments: Orientation   Orientation impairments: Time                   PT - Cognition Comments: pt attempting to answer questions, more difficulty with open-ended questions. needs cues to attend to L side, to use L extremities. Following commands: Impaired Following commands impaired: Follows one step commands inconsistently, Follows one step commands with increased time     Cueing Cueing Techniques: Verbal cues, Gestural cues, Tactile cues, Visual cues     General Comments General comments (skin integrity, edema, etc.): VSS        Assessment/Plan    PT Assessment Patient needs continued PT services  PT Problem List Decreased strength;Decreased range of motion;Decreased activity tolerance;Decreased balance;Decreased mobility;Decreased coordination;Decreased cognition;Decreased safety awareness;Impaired sensation;Impaired tone       PT Treatment Interventions DME instruction;Gait training;Stair training;Functional mobility training;Therapeutic activities;Therapeutic exercise;Balance training;Neuromuscular re-education;Patient/family education;Cognitive remediation    PT Goals (Current goals can be found in the Care Plan section)  Acute Rehab PT Goals Patient Stated Goal: return to independence PT Goal Formulation: With patient Time For Goal Achievement: 03/04/24 Potential to Achieve Goals: Good    Frequency Min 3X/week     Co-evaluation PT/OT/SLP Co-Evaluation/Treatment: Yes Reason for Co-Treatment: Complexity of the patient's impairments  (multi-system involvement);For patient/therapist  safety;To address functional/ADL transfers PT goals addressed during session: Mobility/safety with mobility;Balance;Strengthening/ROM         AM-PAC PT 6 Clicks Mobility  Outcome Measure Help needed turning from your back to your side while in a flat bed without using bedrails?: A Little Help needed moving from lying on your back to sitting on the side of a flat bed without using bedrails?: A Lot Help needed moving to and from a bed to a chair (including a wheelchair)?: Total Help needed standing up from a chair using your arms (e.g., wheelchair or bedside chair)?: A Lot Help needed to walk in hospital room?: Total Help needed climbing 3-5 steps with a railing? : Total 6 Click Score: 10    End of Session Equipment Utilized During Treatment: Gait belt Activity Tolerance: Patient tolerated treatment well Patient left: in chair;with call bell/phone within reach;with chair alarm set Nurse Communication: Mobility status PT Visit Diagnosis: Unsteadiness on feet (R26.81);Other abnormalities of gait and mobility (R26.89);Repeated falls (R29.6);Muscle weakness (generalized) (M62.81);Hemiplegia and hemiparesis Hemiplegia - Right/Left: Left Hemiplegia - dominant/non-dominant: Non-dominant Hemiplegia - caused by: Cerebral infarction    Time: 1610-9604 PT Time Calculation (min) (ACUTE ONLY): 30 min   Charges:   PT Evaluation $PT Eval Moderate Complexity: 1 Mod   PT General Charges $$ ACUTE PT VISIT: 1 Visit         Barnabas Booth, PT, DPT   Acute Rehabilitation Department Office [phone] Secure Chat Communication Preferred  Lona Rist 02/19/2024, 1:33 PM

## 2024-02-19 NOTE — Progress Notes (Signed)
 Inpatient Rehab Admissions Coordinator Note:   Per therapy recommendations patient was screened for CIR candidacy by Mickey Alar, PT. At this time, pt appears to be a potential candidate for CIR. I will place an order for rehab consult for full assessment, per our protocol.  Please contact me any with questions.Loye Rumble, PT, DPT [phone] 02/19/24 4:09 PM

## 2024-02-19 NOTE — Progress Notes (Signed)
 Patient noted to be having more frequent and prolonged expressive aphasia and word salad type speech. Patient is still moving extremities as previously noted, with LUE being the most weak and LLE with mild weakness. Pupils, 3s, equal and reactive. Unsure if true change in neuro status or if patient is being uncooperative in answering questions. Patient could previously tell me his name and age correctly.   Dr. Bonnita Buttner paged and updated over the phone. CT head ordered. Dr. Arora at bedside as patient is being wheeled down for CT, patient does verbalize name at this time. CT head obtained.

## 2024-02-19 NOTE — Plan of Care (Signed)
  Problem: Clinical Measurements: Goal: Ability to maintain clinical measurements within normal limits will improve Outcome: Progressing Goal: Respiratory complications will improve Outcome: Progressing Goal: Cardiovascular complication will be avoided Outcome: Progressing   Problem: Coping: Goal: Level of anxiety will decrease Outcome: Progressing   Problem: Pain Managment: Goal: General experience of comfort will improve and/or be controlled Outcome: Progressing   Problem: Nutrition: Goal: Risk of aspiration will decrease Outcome: Progressing

## 2024-02-19 NOTE — Progress Notes (Signed)
 Physical Therapy Treatment Patient Details Name: Juan Townsend MRN: [id number] DOB: 1975-11-15 Today's Date: 02/19/2024   History of Present Illness The pt is a 48 yo male presenting 6/10 with left sided weakness, falls, and L eye vision changes. 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 Fall out of chair on 6/11. PMH includes: CVA without residual deficits, HIV, hypercholesteremia, and HTN.    PT Comments  I entered the room with bed alarm going off, pt using RUE and RLE to attempt to climb out of bed. I attempted to help the pt reposition, but he was unable to communicate needs or discomfort at this time relative to earlier session. Pt with more mumbled and garbled speech and clearly more frustrated by difficulty at this time. He continued to pull at lines, leads, and catheter, RN aware and asked for PT to place RUE in mitt and then assisted with posey belt in bed due to continued restlessness and attempts to move LE out of bed. VSS throughout, will continue efforts acutely, recommendations remain appropriate.     If plan is discharge home, recommend the following: Two people to help with walking and/or transfers;Two people to help with bathing/dressing/bathroom;Assistance with cooking/housework;Assistance with feeding;Direct supervision/assist for medications management;Direct supervision/assist for financial management;Assist for transportation;Help with stairs or ramp for entrance;Supervision due to cognitive status   Can travel by private vehicle        Equipment Recommendations  Wheelchair (measurements PT);Wheelchair cushion (measurements PT)    Recommendations for Other Services Rehab consult;Speech consult     Precautions / Restrictions Precautions Precautions: Fall Recall of Precautions/Restrictions: Impaired Precaution/Restrictions Comments: permissive HTN, high fall risk (fell out of chair  6/11) Restrictions Weight Bearing Restrictions Per Provider Order: No     Mobility  Bed Mobility Overal bed mobility: Needs Assistance Bed Mobility: Rolling Rolling: Min assist   Supine to sit: Mod assist, +2 for safety/equipment     General bed mobility comments: attempting to reposition in bed as he is impulsively attempting to climb out of bed, reaching with RUE to pull on bed rails and move LE out of bed. discusse with RN and helped apply mit on RUE and posey belt. pt assisted to long sitting to don posey belt with modA of 2    Transfers Overall transfer level: Needs assistance Equipment used: 2 person hand held assist Transfers: Sit to/from Stand, Bed to chair/wheelchair/BSC Sit to Stand: Mod assist, +2 physical assistance     Squat pivot transfers: Total assist, +2 physical assistance, +2 safety/equipment     General transfer comment: deferred as pt restless and had just fallen out of chair    Ambulation/Gait               General Gait Details: unable to stand well enough to steady for stepping    Modified Rankin (Stroke Patients Only) Modified Rankin (Stroke Patients Only) Pre-Morbid Rankin Score: No symptoms Modified Rankin: Severe disability     Balance Overall balance assessment: Needs assistance Sitting-balance support: Single extremity supported, Feet supported Sitting balance-Leahy Scale: Poor Sitting balance - Comments: L lateral and posterior lean Postural control: Left lateral lean, Posterior lean Standing balance support: Bilateral upper extremity supported, During functional activity Standing balance-Leahy Scale: Zero Standing balance comment: dependent on external support  Communication Communication Communication: Impaired Factors Affecting Communication: Reduced clarity of speech;Difficulty expressing self  Cognition Arousal: Alert Behavior During Therapy: Flat affect   PT - Cognitive  impairments: Difficult to assess Difficult to assess due to: Impaired communication Orientation impairments: Time                   PT - Cognition Comments: despite previously comunicating in short one-word answers, pt now not able to state his name, location, or what he needs. pt restless and attempting to get out of bed, unintelligable speech Following commands: Impaired Following commands impaired: Follows one step commands inconsistently, Follows one step commands with increased time    Cueing Cueing Techniques: Verbal cues, Gestural cues, Tactile cues, Visual cues  Exercises      General Comments General comments (skin integrity, edema, etc.): VSS, pt more restless and more difficulty with communication. pt visibly frustrated with difficulty communicating. RN present and assisted with donning restraints      Pertinent Vitals/Pain Pain Assessment Pain Assessment: No/denies pain    Home Living Family/patient expects to be discharged to:: Private residence Living Arrangements: Non-relatives/Friends;Other relatives (brother)   Type of Home: House Home Access: Stairs to enter Entrance Stairs-Rails: None Entrance Stairs-Number of Steps: 3   Home Layout: One level Home Equipment: None      Prior Function            PT Goals (current goals can now be found in the care plan section) Acute Rehab PT Goals Patient Stated Goal: return to independence PT Goal Formulation: With patient Time For Goal Achievement: 03/04/24 Potential to Achieve Goals: Good Progress towards PT goals: Not progressing toward goals - comment    Frequency    Min 3X/week           Co-evaluation   Reason for Co-Treatment: Complexity of the patient's impairments (multi-system involvement);For patient/therapist safety;To address functional/ADL transfers PT goals addressed during session: Mobility/safety with mobility;Balance;Strengthening/ROM OT goals addressed during session: ADL's and  self-care      AM-PAC PT 6 Clicks Mobility   Outcome Measure  Help needed turning from your back to your side while in a flat bed without using bedrails?: A Little Help needed moving from lying on your back to sitting on the side of a flat bed without using bedrails?: A Lot Help needed moving to and from a bed to a chair (including a wheelchair)?: Total Help needed standing up from a chair using your arms (e.g., wheelchair or bedside chair)?: A Lot Help needed to walk in hospital room?: Total Help needed climbing 3-5 steps with a railing? : Total 6 Click Score: 10    End of Session   Activity Tolerance: Treatment limited secondary to agitation Patient left: with call bell/phone within reach;in bed;with nursing/sitter in room;with bed alarm set;with restraints reapplied (highest setting bed alarm) Nurse Communication: Mobility status PT Visit Diagnosis: Unsteadiness on feet (R26.81);Other abnormalities of gait and mobility (R26.89);Repeated falls (R29.6);Muscle weakness (generalized) (M62.81);Hemiplegia and hemiparesis Hemiplegia - Right/Left: Left Hemiplegia - dominant/non-dominant: Non-dominant Hemiplegia - caused by: Cerebral infarction     Time: 1610-9604 PT Time Calculation (min) (ACUTE ONLY): 12 min  Charges:    $Therapeutic Activity: 8-22 mins PT General Charges $$ ACUTE PT VISIT: 1 Visit                     Barnabas Booth, PT, DPT   Acute Rehabilitation Department Office [phone] Secure Chat Communication Preferred   Lona Rist 02/19/2024, 4:06  PM

## 2024-02-19 NOTE — Evaluation (Addendum)
 Occupational Therapy Evaluation Patient Details Name: Juan Townsend MRN: [id number] DOB: 11-28-1975 Today's Date: 02/19/2024   History of Present Illness   The pt is a 48 yo male presenting 6/10 with left sided weakness, falls, and L eye vision changes. 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 PMH includes: CVA without residual deficits, HIV, hypercholesteremia, and HTN.     Clinical Impressions PTA, pt lived with brother and was independent, works for [employer] per his report. Upon eval, pt presents with decreased safety, awareness, L hemiplegia, decreased vision. Pt needing mod-total A for all mobility and ADL at this time. Pt able to STS with mod A +2 and needing total A +2 for squat pivot to chair. Worked on LUE AROM/PROM using sweep tap method and increasing attention to LUE to facilitate composite flexion. Due to significant change in status, recommending intensive multidisciplinary rehabilitation >3 hours/day to optimize safety and independence in ADL.     If plan is discharge home, recommend the following:   Two people to help with walking and/or transfers;Assistance with cooking/housework;Two people to help with bathing/dressing/bathroom;Direct supervision/assist for medications management;Direct supervision/assist for financial management;Assist for transportation;Help with stairs or ramp for entrance;Assistance with feeding     Functional Status Assessment   Patient has had a recent decline in their functional status and demonstrates the ability to make significant improvements in function in a reasonable and predictable amount of time.     Equipment Recommendations   Other (comment) (defer)     Recommendations for Other Services         Precautions/Restrictions   Precautions Precautions: Fall Recall of Precautions/Restrictions: Impaired Precaution/Restrictions Comments: permissive  HTN Restrictions Weight Bearing Restrictions Per Provider Order: No     Mobility Bed Mobility Overal bed mobility: Needs Assistance Bed Mobility: Supine to Sit     Supine to sit: Mod assist, +2 for safety/equipment     General bed mobility comments: modA to complete movement of LLE and LUE, pt pulling with RUE to elevate trunk, leaning to L in sitting and needing modA to maintain balance    Transfers Overall transfer level: Needs assistance Equipment used: 2 person hand held assist Transfers: Sit to/from Stand, Bed to chair/wheelchair/BSC Sit to Stand: Mod assist, +2 physical assistance   Squat pivot transfers: Total assist, +2 physical assistance, +2 safety/equipment       General transfer comment: pt completed sit-stand with good initiation in RLE, assist for balance in RUE, but seems to be pushing to L. strong L lean and no activation of LLE so assist to prevent buckle. poor motor planning with attempts at squat pivot      Balance Overall balance assessment: Needs assistance Sitting-balance support: Single extremity supported, Feet supported Sitting balance-Leahy Scale: Poor Sitting balance - Comments: L lateral and posterior lean Postural control: Left lateral lean, Posterior lean Standing balance support: Bilateral upper extremity supported, During functional activity Standing balance-Leahy Scale: Zero Standing balance comment: dependent on external support                           ADL either performed or assessed with clinical judgement   ADL Overall ADL's : Needs assistance/impaired Eating/Feeding: Set up;Bed level   Grooming: Moderate assistance;Sitting   Upper Body Bathing: Moderate assistance;Maximal assistance;Sitting   Lower Body Bathing: Maximal assistance;+2 for physical assistance;+2 for safety/equipment   Upper Body Dressing : Maximal assistance;Sitting  Lower Body Dressing: Maximal assistance;Sit to/from stand   Toilet Transfer:  Moderate assistance;+2 for physical assistance;+2 for safety/equipment Toilet Transfer Details (indicate cue type and reason): for STS ; total A for squat pivot                 Vision Baseline Vision/History: 0 No visual deficits Ability to See in Adequate Light: 0 Adequate Patient Visual Report: Blurring of vision Vision Assessment?: Vision impaired- to be further tested in functional context;Yes Tracking/Visual Pursuits:  (decr smoothness of tracking on the L) Convergence: Impaired - to be further tested in functional context Visual Fields: Other (comment) (questionable L visual field deficits) Depth Perception: Overshoots (reaching with RUE)     Perception Perception: Impaired Preception Impairment Details: Inattention/Neglect Perception-Other Comments: to L   Praxis Praxis: Impaired Praxis Impairment Details: Motor planning     Pertinent Vitals/Pain Pain Assessment Pain Assessment: No/denies pain     Extremity/Trunk Assessment Upper Extremity Assessment Upper Extremity Assessment: Generalized weakness;LUE deficits/detail LUE Deficits / Details: Pt with significant LUE weakness, needing increased attention to LUE to activate at all; facilitating composite flexion with sweep tap and pt initiating on R and then L with significantly increased time. pt with no active shoulder shrug. pt In gravity eliminated position able to perofrm shoulder adduction, with trace activation elbow flexion. LUE Sensation: decreased light touch (although per pt report, feels same bilaterally, decr attention to touch at other times) LUE Coordination: decreased fine motor;decreased gross motor   Lower Extremity Assessment Lower Extremity Assessment: Defer to PT evaluation RLE Deficits / Details: grossly 5/5 to MMT, coordiantion vs motor planning deficits RLE Coordination: decreased gross motor LLE Deficits / Details: no activation to command, 3-5 beats clonus in ankle, increased tone L HS. LLE  Sensation: decreased light touch;decreased proprioception LLE Coordination: decreased fine motor;decreased gross motor   Cervical / Trunk Assessment Cervical / Trunk Assessment: Kyphotic   Communication Communication Communication: Impaired Factors Affecting Communication: Reduced clarity of speech;Difficulty expressing self   Cognition Arousal: Alert Behavior During Therapy: Flat affect Cognition: No family/caregiver present to determine baseline, Cognition impaired     Awareness: Intellectual awareness impaired, Online awareness impaired (some awareness of L deficits) Memory impairment (select all impairments): Short-term memory, Working memory Attention impairment (select first level of impairment): Focused attention Executive functioning impairment (select all impairments): Organization, Sequencing, Reasoning, Problem solving OT - Cognition Comments: cues for attention to L side throughout with decreased awareness. pt needing multimodal cues for safety. pt able to answer basic questions when able to word find to do so.                 Following commands: Impaired Following commands impaired: Follows one step commands inconsistently, Follows one step commands with increased time     Cueing  General Comments   Cueing Techniques: Verbal cues;Gestural cues;Tactile cues;Visual cues  VSS   Exercises     Shoulder Instructions      Home Living Family/patient expects to be discharged to:: Private residence Living Arrangements: Non-relatives/Friends;Other relatives (brother)   Type of Home: House Home Access: Stairs to enter [employer] of Steps: 3 Entrance Stairs-Rails: None Home Layout: One level     Bathroom Shower/Tub: Chief Strategy Officer: Standard     Home Equipment: None          Prior Functioning/Environment Prior Level of Function : Independent/Modified Independent;Working/employed;Driving             Mobility  Comments: independent, no other falls,  works at [employer], carrying [employer] as part of work duties ADLs Comments: independent    OT Problem List: Decreased strength;Decreased activity tolerance;Impaired balance (sitting and/or standing);Impaired vision/perception;Decreased cognition;Decreased coordination;Decreased range of motion;Decreased safety awareness;Decreased knowledge of use of DME or AE;Impaired sensation;Impaired tone;Impaired UE functional use   OT Treatment/Interventions: Self-care/ADL training;Therapeutic exercise;DME and/or AE instruction;Therapeutic activities;Patient/family education;Balance training      OT Goals(Current goals can be found in the care plan section)   Acute Rehab OT Goals Patient Stated Goal: none stated OT Goal Formulation: With patient Time For Goal Achievement: 03/04/24 Potential to Achieve Goals: Fair ADL Goals Pt Will Perform Grooming: with set-up;sitting Pt Will Perform Upper Body Dressing: sitting;with min assist Pt Will Perform Lower Body Dressing: with mod assist;sit to/from stand;sitting/lateral leans Pt Will Transfer to Toilet: with mod assist Pt/caregiver will Perform Home Exercise Program: Left upper extremity;Increased ROM;Increased strength Additional ADL Goal #1: Pt will follow all one step commands with 100% accuracy Additional ADL Goal #2: Pt will visually scan to locate items on L side with min cues 4/5 attempts.   OT Frequency:  Min 2X/week    Co-evaluation PT/OT/SLP Co-Evaluation/Treatment: Yes Reason for Co-Treatment: Complexity of the patient's impairments (multi-system involvement);For patient/therapist safety;To address functional/ADL transfers PT goals addressed during session: Mobility/safety with mobility;Balance;Strengthening/ROM OT goals addressed during session: ADL's and self-care      AM-PAC OT 6 Clicks Daily Activity     Outcome Measure Help from another person eating meals?: A  Little Help from another person taking care of personal grooming?: A Lot Help from another person toileting, which includes using toliet, bedpan, or urinal?: Total Help from another person bathing (including washing, rinsing, drying)?: A Lot Help from another person to put on and taking off regular upper body clothing?: A Lot Help from another person to put on and taking off regular lower body clothing?: Total 6 Click Score: 11   End of Session Equipment Utilized During Treatment: Gait belt Nurse Communication: Mobility status;Other (comment) (cleared for chair with alarm on)  Activity Tolerance: Patient tolerated treatment well Patient left: with call bell/phone within reach;in chair;with chair alarm set  OT Visit Diagnosis: Unsteadiness on feet (R26.81);Muscle weakness (generalized) (M62.81);Other symptoms and signs involving cognitive function;Low vision, both eyes (H54.2);Hemiplegia and hemiparesis Hemiplegia - Right/Left: Left Hemiplegia - caused by: Cerebral infarction                Time: 1610-9604 OT Time Calculation (min): 31 min Charges:  OT General Charges $OT Visit: 1 Visit OT Evaluation $OT Eval Moderate Complexity: 1 Mod  Karilyn Ouch, OTR/L [facility] Acute Rehabilitation Office: [phone]   Emery Hans 02/19/2024, 2:45 PM

## 2024-02-19 NOTE — Progress Notes (Signed)
 Patient seen restless in bed and pulling at gown, external catheter. Patient had already removed a peripheral IV when RNs rounded on patient for report. Patient reminded about being able to use external catheter to void.   Patient was cleaned up, repositioned, and new peripheral IV placed. Safety mitts were placed on patient's hands, however patient was still attempting to remove equipment and mitts. Patient was informed of reason for mitts, with verbal reminder to not pull on leads and tubing. Orders for RUE restraint obtained.   CCM MD contacted about continuous or prn medications to help with patient restlessness/agitation.
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
## 2024-02-19 NOTE — Progress Notes (Signed)
 Juan Townsend appears to have more word finding difficulty and increased agitation at this time. Waist belt and soft restraint (that had been removed) were reapplied after fall for patient safety. He would speak unintelligibly and get agitated when he was unable to be understood. This progressed until he would not answer this RN's LOC questions or say anything to this RN. He would shake his head yes/no and still follow commands. Golden Late, NP made aware. No changes at this time.

## 2024-02-19 NOTE — Progress Notes (Addendum)
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 droop. Tongue protrusion to the left. RUE and RLE 5/5, LUE proximal 0/5, bicep 2/5, tricep 1/5. LLE 1/5 proximal and 0/5 toe movement. Sensation symmetrical bilaterally subjectively, no sensory neglect, R FTN intact, gait not tested.   Patient unable to have MRI due to shrapnel, will repeat CT in the a.m.  BP goal less than 220/120 but recommend gradually normalize BP in 3 to 5 days.  On DAPT and statin, may consider mono therapy if thrombocytopenia getting worse.  On IVF for AKI on CKD.  PT and OT recommend CIR  For detailed assessment and plan, please refer to above as I have made changes wherever appropriate.   Consuelo Denmark, MD PhD Stroke Neurology 02/19/2024 6:43 PM  This patient is critically ill due to stroke, hypertensive emergency, thrombocytopenia, AKI and at significant risk of neurological worsening, death form recurrent stroke, hemorrhagic conversion, bleeding from thrombocytopenia, renal failure. This patient's care requires constant monitoring of vital signs, hemodynamics, respiratory and cardiac monitoring, review of multiple databases, neurological assessment, discussion with family, other specialists and medical decision making of high complexity.  I spent 35 minutes of neurocritical care time in the care of this patient.    To contact Stroke Continuity provider, please refer to [email or web address]. After hours, contact General Neurology

## 2024-02-19 NOTE — Progress Notes (Signed)
 Pt wrist restraint was removed at 1000 this morning. Pt had met appropriate measures to d/c restraint. Restraints were re-applied once patient was returned to bed at 1410 due to repeated attempts to climb out of bed and removal of equipment. Restraints okay to be reapplied for patient safety by Rice Chamorro Gleason, PA.

## 2024-02-19 NOTE — Progress Notes (Signed)
 I was notified by the RN that the patient has had agitation for which she was given Zyprexa.  His speech has since been somewhat difficult to comprehend.  When questioned on more details in the speech, I was told that he has been similar since shift change.  That was the reason that the CT angiography and repeat imaging was performed.  There is suspicion for stroke at this time but initially, the exam did not document any evidence of aphasia.  On my examination, he is awake alert oriented to self, was able to tell me his name but on asking him his age, he mumbled incomprehensibly.  Mild weakness on the left upper extremity. Remainder of his speech also sounded more of word salad and that is how he has been at least for the past 6 hours or so per the nursing report.  He is unable to get an MRI due to some metallic fragment Repeating a head CT to evaluate further for any evidence of bleed or large evolving infarct..  Remains outside the window for intervention since last known well is greater than 24 hours at this time and CT angio head and neck done last evening was concerning for multifocal stenosis and distal ACA occlusion as well as some perfusion deficits in the left frontal lobe indicating an already established core infarct Remains on Cleviprex at this time  Repeat CTH to my review looks unchanged. Radiologist read agrees.  Continue care as recommended in the consult note.    -- Tona Francis, MD Neurologist [facility]

## 2024-02-19 NOTE — TOC CAGE-AID Note (Signed)
 Transition of Care [facility]) - CAGE-AID Screening   Patient Details  Name: Juan Townsend MRN: [id number] Date of Birth: 1976-03-13  Transition of Care [facility]) CM/SW Contact:    Jannice Mends, LCSW Phone Number: 02/19/2024, 9:34 AM   Clinical Narrative: Patient disoriented and not able to participate in screening at this time.    CAGE-AID Screening: Substance Abuse Screening unable to be completed due to: : Patient unable to participate

## 2024-02-20 ENCOUNTER — Inpatient Hospital Stay (HOSPITAL_COMMUNITY): Payer: MEDICAID

## 2024-02-20 DIAGNOSIS — I160 Hypertensive urgency: Secondary | ICD-10-CM

## 2024-02-20 DIAGNOSIS — R531 Weakness: Principal | ICD-10-CM

## 2024-02-20 DIAGNOSIS — I739 Peripheral vascular disease, unspecified: Secondary | ICD-10-CM

## 2024-02-20 DIAGNOSIS — G8114 Spastic hemiplegia affecting left nondominant side: Secondary | ICD-10-CM

## 2024-02-20 DIAGNOSIS — R1310 Dysphagia, unspecified: Secondary | ICD-10-CM

## 2024-02-20 LAB — CBC
HCT: 30.9 % — ABNORMAL LOW (ref 39.0–52.0)
Hemoglobin: 11 g/dL — ABNORMAL LOW (ref 13.0–17.0)
MCH: 31.1 pg (ref 26.0–34.0)
MCHC: 35.6 g/dL (ref 30.0–36.0)
MCV: 87.3 fL (ref 80.0–100.0)
Platelets: 109 10*3/uL — ABNORMAL LOW (ref 150–400)
RBC: 3.54 MIL/uL — ABNORMAL LOW (ref 4.22–5.81)
RDW: 13.2 % (ref 11.5–15.5)
WBC: 5.4 10*3/uL (ref 4.0–10.5)
nRBC: 0 % (ref 0.0–0.2)

## 2024-02-20 LAB — BASIC METABOLIC PANEL WITH GFR
Anion gap: 12 (ref 5–15)
BUN: 24 mg/dL — ABNORMAL HIGH (ref 6–20)
CO2: 24 mmol/L (ref 22–32)
Calcium: 9.2 mg/dL (ref 8.9–10.3)
Chloride: 100 mmol/L (ref 98–111)
Creatinine, Ser: 2.17 mg/dL — ABNORMAL HIGH (ref 0.61–1.24)
GFR, Estimated: 37 mL/min — ABNORMAL LOW (ref >60)
Glucose, Bld: 98 mg/dL (ref 70–99)
Potassium: 3.4 mmol/L — ABNORMAL LOW (ref 3.5–5.1)
Sodium: 136 mmol/L (ref 135–145)

## 2024-02-20 LAB — GLUCOSE, CAPILLARY
Glucose-Capillary: 97 mg/dL (ref 70–99)
Glucose-Capillary: 99 mg/dL (ref 70–99)
Glucose-Capillary: 94 mg/dL (ref 70–99)
Glucose-Capillary: 95 mg/dL (ref 70–99)
Glucose-Capillary: 92 mg/dL (ref 70–99)

## 2024-02-20 MED ORDER — METOPROLOL TARTRATE 50 MG PO TABS
50.0000 mg | ORAL_TABLET | Freq: Two times a day (BID) | ORAL | Status: DC
  Administered 2024-02-20 – 2024-02-25 (×11): 50 mg via ORAL
  Filled 2024-02-20 (×11): qty 1

## 2024-02-20 MED ORDER — PANTOPRAZOLE SODIUM 40 MG PO TBEC
40.0000 mg | DELAYED_RELEASE_TABLET | Freq: Every day | ORAL | Status: DC
  Administered 2024-02-20 – 2024-02-25 (×6): 40 mg via ORAL
  Filled 2024-02-20 (×6): qty 1

## 2024-02-20 MED ORDER — POTASSIUM CHLORIDE CRYS ER 20 MEQ PO TBCR
40.0000 meq | EXTENDED_RELEASE_TABLET | Freq: Once | ORAL | Status: AC
  Administered 2024-02-20: 40 meq via ORAL
  Filled 2024-02-20: qty 2

## 2024-02-20 MED ORDER — HYDRALAZINE HCL 25 MG PO TABS
25.0000 mg | ORAL_TABLET | Freq: Three times a day (TID) | ORAL | Status: DC
  Administered 2024-02-20 – 2024-02-21 (×4): 25 mg via ORAL
  Filled 2024-02-20 (×4): qty 1

## 2024-02-20 NOTE — TOC Initial Note (Signed)
 Transition of Care [facility]) - Initial/Assessment Note    Patient Details  Name: Juan Townsend MRN: [id number] Date of Birth: [birth year]  Transition of Care [facility]) CM/SW Contact:    Jannice Mends, LCSW Phone Number: 02/20/2024, 9:01 AM  Clinical Narrative:                 Patient admitted from home with CVA. TOC following for CIR determination of candidacy and home support as patient is uninsured.   Expected Discharge Plan: IP Rehab Facility Barriers to Discharge: Continued Medical Work up, Inadequate or no insurance   Patient Goals and CMS Choice            Expected Discharge Plan and Services       Living arrangements for the past 2 months: Single Family Home                                      Prior Living Arrangements/Services Living arrangements for the past 2 months: Single Family Home Lives with:: Roommate Patient language and need for interpreter reviewed:: Yes Do you feel safe going back to the place where you live?: Yes      Need for Family Participation in Patient Care: Yes (Comment) Care giver support system in place?: Yes (comment)   Criminal Activity/Legal Involvement Pertinent to Current Situation/Hospitalization: No - Comment as needed  Activities of Daily Living   ADL Screening (condition at time of admission) Independently performs ADLs?: Yes (appropriate for developmental age) Is the patient deaf or have difficulty hearing?: No Does the patient have difficulty seeing, even when wearing glasses/contacts?: No Does the patient have difficulty concentrating, remembering, or making decisions?: No  Permission Sought/Granted Permission sought to share information with : Facility Industrial/product designer granted to share information with : No              Emotional Assessment Appearance:: Appears stated age Attitude/Demeanor/Rapport: Unable to Assess Affect (typically observed): Unable to Assess Orientation: : Oriented to Self,  Oriented to  Time Alcohol / Substance Use: Not Applicable Psych Involvement: No (comment)  Admission diagnosis:  CVA (cerebral vascular accident) (HCC) [I63.9] Left-sided weakness [R53.1] Patient Active Problem List   Diagnosis Date Noted   CVA (cerebral vascular accident) (HCC) 02/18/2024   PCP:  Vicente Graham, No Pharmacy:   [facility] [phone] - [place], [place] - [address] AT [address] [place] [address] Phone: [phone] Fax: [phone]  [facility][id number] [place], [place] - [address] AT [address] [place] [address] Phone: [phone] Fax: [phone]  [facility] #[id number] [place], [place] - [address] AT [facility] OF [address] & [address] [place] [address] Phone: [phone] Fax: [phone]  [facility] [address] [place] [address] Phone: [phone] Fax: [phone]     Social Drivers of Health (SDOH) Social History: SDOH Screenings   Food Insecurity: Patient Unable To Answer (02/18/2024)  Depression (PHQ2-9): Low Risk  (10/17/2023)  Tobacco Use: High Risk (02/19/2024)   SDOH Interventions:     Readmission Risk Interventions     No data to display

## 2024-02-20 NOTE — Progress Notes (Signed)
 Physical Therapy Treatment Patient Details Name: Juan Townsend MRN: [id number] DOB: 02/06/1976 Today's Date: 02/20/2024   History of Present Illness The pt is a 48 yo male presenting 6/10 with left sided weakness, falls, and L eye vision changes. 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 Fall out of chair on 6/11. PMH includes: CVA without residual deficits, HIV, hypercholesteremia, and HTN.    PT Comments  The pt is making gradual functional progress. Did note L foot wiggling 1x when cued and then withdrawing to noxious stimuli 2x, but otherwise the L leg did not appear to activate with functional mobility. Of note, upon bringing his R leg off the R EOB he began to violently shake throughout his R leg and it returned back onto the bed. The episode lasted ~2-3 seconds and pt was able to immediately verbalize that his R leg has been shaking like this anytime he moves since he was admitted. However, upon repeat attempts, no further shaking noted. MD and RN notified. The pt needs repeated cues to turn his neck and eyes towards his L to attend to his L side of his body and environment. He only required modA to transition supine to sit EOB and to transfer to stand using the stedy multiple times today. He tends to lean laterally to the L and posteriorly. He tends to benefit from multi-modal (visual, tactile, and verbal) cues to correct his directional lean in sitting and standing. He remains at risk for falls and could greatly benefit from intensive inpatient rehab, > 3 hours/day. Will continue to follow acutely.    If plan is discharge home, recommend the following: Two people to help with walking and/or transfers;Two people to help with bathing/dressing/bathroom;Assistance with cooking/housework;Assistance with feeding;Direct supervision/assist for medications management;Direct supervision/assist for financial management;Assist for  transportation;Help with stairs or ramp for entrance;Supervision due to cognitive status   Can travel by private vehicle        Equipment Recommendations  Wheelchair (measurements PT);Wheelchair cushion (measurements PT);BSC/3in1 (TBA further as pt progresses)    Recommendations for Other Services Rehab consult;Speech consult     Precautions / Restrictions Precautions Precautions: Fall Recall of Precautions/Restrictions: Impaired Precaution/Restrictions Comments: permissive HTN, high fall risk (fell out of chair 6/11), R wrist restraint Restrictions Weight Bearing Restrictions Per Provider Order: No     Mobility  Bed Mobility Overal bed mobility: Needs Assistance Bed Mobility: Supine to Sit     Supine to sit: Mod assist, HOB elevated, Used rails     General bed mobility comments: Cues provided to bring legs off R EOB. Upon bringing R leg off EOB he began to violently shake throughout his R leg and it returned back onto the bed. Episode lasted ~2-3 seconds and pt immediately verbalizing his R leg shakes like this anytime he moves since he was admitted. However, upon repeat attempts, no further shaking noted. MD and RN notified. Pt needed cues to visually attend to his L leg to bring it off R EOB with assistance. Cues provided to grab R bed rail with R UE and pull up to sit, modA needed for ascending trunk and gaining balance.    Transfers Overall transfer level: Needs assistance Equipment used: Ambulation equipment used Transfers: Sit to/from Stand, Bed to chair/wheelchair/BSC Sit to Stand: Mod assist           General transfer comment: Juan Townsend utilized to transfer pt OOB to recliner today. Pt needed  hand-over-hand guidance to place L hand on stedy bar and L foot on platform of stedy. Tactile cues provided at L quads and hips to extend. ModA needed to power up to stand and gain balance, x1 rep from EOB and x3 reps from stedy flaps. Noted L lateral lean, needing cues to  correct. Transfer via Lift Equipment: Stedy  Ambulation/Gait               General Gait Details: unable to safely progress to ambulating yet, poor standing balance   Stairs             Wheelchair Mobility     Tilt Bed    Modified Rankin (Stroke Patients Only) Modified Rankin (Stroke Patients Only) Pre-Morbid Rankin Score: No symptoms Modified Rankin: Severe disability     Balance Overall balance assessment: Needs assistance Sitting-balance support: Single extremity supported, Feet supported Sitting balance-Leahy Scale: Poor Sitting balance - Comments: L lateral and posterior lean noted, progressed from needing modA initially to needing CGA-minA for static sitting balance with R UE support Postural control: Left lateral lean, Posterior lean Standing balance support: Bilateral upper extremity supported, During functional activity Standing balance-Leahy Scale: Poor Standing balance comment: Posterior and L lateral lean noted, needing min-modA and tactile cues at L quads and hips to extend. Tactile, verbal, and visual cues provided to try to find and maintain midline, with momentary success noted.                            Communication Communication Communication: Impaired Factors Affecting Communication: Reduced clarity of speech;Difficulty expressing self  Cognition Arousal: Alert Behavior During Therapy: Flat affect   PT - Cognitive impairments: Difficult to assess Difficult to assess due to: Impaired communication                     PT - Cognition Comments: Pt speaks softly and with short phrases. Needed extra time to find the words at a point. Slow to process cues and decreased awareness of his directional lean, benefiting from visual, tactile, and verbal cues to correct his lean. Decreased attention to L side, needing max cues to track and turn head to look at his L side. Following commands: Impaired Following commands impaired:  Follows one step commands inconsistently, Follows one step commands with increased time    Cueing Cueing Techniques: Verbal cues, Gestural cues, Tactile cues, Visual cues  Exercises Other Exercises Other Exercises: sit <> stand 4x in stedy with modA    General Comments General comments (skin integrity, edema, etc.): HTN noted, RN aware and reports pt has permissive HTN      Pertinent Vitals/Pain Pain Assessment Pain Assessment: Faces Faces Pain Scale: Hurts a little bit Pain Location: could not specify when asked Pain Descriptors / Indicators: Discomfort, Grimacing Pain Intervention(s): Limited activity within patient's tolerance, Monitored during session, Repositioned    Home Living                          Prior Function            PT Goals (current goals can now be found in the care plan section) Acute Rehab PT Goals Patient Stated Goal: return to independence PT Goal Formulation: With patient Time For Goal Achievement: 03/04/24 Potential to Achieve Goals: Good Progress towards PT goals: Progressing toward goals    Frequency    Min 3X/week  PT Plan      Co-evaluation              AM-PAC PT 6 Clicks Mobility   Outcome Measure  Help needed turning from your back to your side while in a flat bed without using bedrails?: A Little Help needed moving from lying on your back to sitting on the side of a flat bed without using bedrails?: A Lot Help needed moving to and from a bed to a chair (including a wheelchair)?: A Lot Help needed standing up from a chair using your arms (e.g., wheelchair or bedside chair)?: A Lot Help needed to walk in hospital room?: Total Help needed climbing 3-5 steps with a railing? : Total 6 Click Score: 11    End of Session Equipment Utilized During Treatment: Gait belt Activity Tolerance: Patient tolerated treatment well Patient left: with call bell/phone within reach;with restraints reapplied;in chair;with chair  alarm set (chair alarm head start belt donned) Nurse Communication: Mobility status;Other (comment) (BP; shaking in L leg - notified MD also) PT Visit Diagnosis: Unsteadiness on feet (R26.81);Other abnormalities of gait and mobility (R26.89);Repeated falls (R29.6);Muscle weakness (generalized) (M62.81);Hemiplegia and hemiparesis;Other symptoms and signs involving the nervous system (R29.898) Hemiplegia - Right/Left: Left Hemiplegia - dominant/non-dominant: Non-dominant Hemiplegia - caused by: Cerebral infarction     Time: 1402-1440 PT Time Calculation (min) (ACUTE ONLY): 38 min  Charges:    $Therapeutic Activity: 23-37 mins $Neuromuscular Re-education: 8-22 mins PT General Charges $$ ACUTE PT VISIT: 1 Visit                     Vernida Goodie, PT, DPT Acute Rehabilitation Services  Office: [phone]    Ellyn Hack 02/20/2024, 5:24 PM

## 2024-02-20 NOTE — Procedures (Signed)
 Objective Swallowing Evaluation: Type of Study: FEES-Fiberoptic Endoscopic Evaluation of Swallow   Patient Details  Name: Juan Townsend MRN: [id number] Date of Birth: [birth year]  Today's Date: 02/20/2024 Time: SLP Start Time (ACUTE ONLY): 6962 -SLP Stop Time (ACUTE ONLY): 0959  SLP Time Calculation (min) (ACUTE ONLY): 21 min   Past Medical History:  Past Medical History:  Diagnosis Date   HIV (human immunodeficiency virus infection) (HCC)    Hypercholesteremia    Hypertension    Stroke [facility])    Past Surgical History: History reviewed. No pertinent surgical history. HPI: Juan Townsend is a 48 yo male presenting to ED 6/10 with L sided weakness, falls, and L eye vision changes. 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 Pt passed the yale swallow screen 6/10 but RN noted increased coughing with liquids and SLP was consulted. PMH includes CVA without residual deficits, HIV, hypercholesteremia, HTN   Subjective: flat affect    Assessment / Plan / Recommendation     02/20/2024   12:03 PM  Clinical Impressions  Clinical Impression                             Study generally inconclusive in regards to swallowing function and safety with PO. Pt does have some movement of both arytenoids, but movement is limited and incongruent. Question significant laryngeal tension.  Pt has minimal glottic space at rest, and is able to achieve laryngeal closure on command. He additionally has a very deep and narrow pharynx and a tall epiglottis with a rounded excresence on the right. Erythema on left aryepiglottic fold. Anatomy and almost closed glottic space makes visualization of subglottis difficult. Pt also sensate to scope and slightly defensive. Pt has lots of clear secretions at baseline and begins  coughing upon placement of scope. When given PO there is spillage of thin to the pyriforms and over the medial epiglottis, but could not appreciate penetration or aspiration. Pt had significant coughing however, and seemed to have a laryngospasm response with stridor after two sips. Scope removed, pts breathing calmed and coughing stopped. Given poor tolerance of testing and poor visualization of airway, recommend f/u with MBS to determine safety with PO. Pt would benefit from f/u with ENT.                         SLP Visit Diagnosis Aphonia (R49.1);Cognitive communication deficit (R41.841)  Attention and concentration deficit following --  Frontal lobe and executive function deficit following --  Impact on safety and function Moderate aspiration risk         02/20/2024   11:38 AM  Treatment Recommendations  Treatment Recommendations Therapy as outlined in treatment plan below        02/20/2024   11:38 AM  Prognosis  Prognosis for improved oropharyngeal function Good  Barriers to Reach Goals Cognitive deficits  Barriers/Prognosis Comment --       02/20/2024   12:03 PM  Diet Recommendations  SLP Diet Recommendations NPO except meds;Ice chips PRN after oral care;Free water protocol after oral care  Liquid Administration via Straw  Medication Administration Whole meds with puree  Compensations Slow rate;Small sips/bites  Postural Changes Remain semi-upright after after feeds/meals (Comment)  02/20/2024   12:03 PM  Other Recommendations  Recommended Consults --  Oral Care Recommendations --  Caregiver Recommendations --  Follow Up Recommendations Acute inpatient rehab (3hours/day)  Assistance recommended at discharge Frequent or constant Supervision/Assistance  Functional Status Assessment Patient has had a recent decline in their functional status and demonstrates the ability to make significant improvements in function in a reasonable and predictable  amount of time.       02/20/2024   12:03 PM  Frequency and Duration   Speech Therapy Frequency (ACUTE ONLY) min 2x/week  Treatment Duration --         02/20/2024   12:03 PM  Oral Phase  Oral Phase WFL  Oral - Pudding Teaspoon --  Oral - Pudding Cup --  Oral - Honey Teaspoon --  Oral - Honey Cup --  Oral - Nectar Teaspoon --  Oral - Nectar Cup --  Oral - Nectar Straw --  Oral - Thin Teaspoon --  Oral - Thin Cup --  Oral - Thin Straw --  Oral - Puree --  Oral - Mech Soft --  Oral - Regular --  Oral - Multi-Consistency --  Oral - Pill --  Oral Phase - Comment --       02/20/2024   12:03 PM  Pharyngeal Phase  Pharyngeal Phase Impaired  Pharyngeal- Pudding Teaspoon --  Pharyngeal --  Pharyngeal- Pudding Cup --  Pharyngeal --  Pharyngeal- Honey Teaspoon --  Pharyngeal --  Pharyngeal- Honey Cup --  Pharyngeal --  Pharyngeal- Nectar Teaspoon --  Pharyngeal --  Pharyngeal- Nectar Cup --  Pharyngeal --  Pharyngeal- Nectar Straw --  Pharyngeal --  Pharyngeal- Thin Teaspoon NT  Pharyngeal --  Pharyngeal- Thin Cup NT  Pharyngeal --  Pharyngeal- Thin Straw Other (Comment)  Pharyngeal --  Pharyngeal- Puree --  Pharyngeal --  Pharyngeal- Mechanical Soft --  Pharyngeal --  Pharyngeal- Regular --  Pharyngeal --  Pharyngeal- Multi-consistency --  Pharyngeal --  Pharyngeal- Pill --  Pharyngeal --  Pharyngeal Comment --         No data to display           Juan Townsend, Hardin Leys 02/20/2024, 1:39 PM

## 2024-02-20 NOTE — Evaluation (Signed)
 Speech Language Pathology Evaluation Patient Details Name: Juan Townsend MRN: [id number] DOB: 10-17-1975 Today's Date: 02/20/2024 Time: 4098-1191 SLP Time Calculation (min) (ACUTE ONLY): 21 min  Problem List:  Patient Active Problem List   Diagnosis Date Noted   Left-sided weakness 02/20/2024   Hypertensive urgency 02/20/2024   CVA (cerebral vascular accident) [facility]) 02/18/2024   Past Medical History:  Past Medical History:  Diagnosis Date   HIV (human immunodeficiency virus infection) (HCC)    Hypercholesteremia    Hypertension    Stroke [facility])    Past Surgical History: History reviewed. No pertinent surgical history. HPI:  Juan Townsend is a 48 yo male presenting to ED 6/10 with L sided weakness, falls, and L eye vision changes. 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 Pt passed the yale swallow screen 6/10 but RN noted increased coughing with liquids and SLP was consulted. PMH includes CVA without residual deficits, HIV, hypercholesteremia, HTN   Assessment / Plan / Recommendation Clinical Impression  Pt is oriented to self and time but not to place or situation. He is able to discuss aspects of his history when prompted and states that he lives with two people, one of which is his brother in law but he cannot identify the second person. He presents with a flat affect and reports difficulty with verbal expression but was 100% accurate with sentence repetition and confrontation naming. He does exhibit difficulty with reasoning, problem solving, initiation, and memory. Spontaneous speech is often breathy, sometimes aphonic but he intermittently self-corrects with improved results. Pt reports complete independence PTA so current performance is suspected to be different from his baseline. Recommend intensive SLP  f/u, >3 hrs/day to target deficits listed above. Will continue following acutely.    SLP Assessment  SLP Recommendation/Assessment: Patient needs continued Speech Language Pathology Services SLP Visit Diagnosis: Aphonia (R49.1);Cognitive communication deficit (R41.841)     Assistance Recommended at Discharge  Frequent or constant Supervision/Assistance  Functional Status Assessment Patient has had a recent decline in their functional status and demonstrates the ability to make significant improvements in function in a reasonable and predictable amount of time.  Frequency and Duration min 2x/week  2 weeks      SLP Evaluation Cognition  Overall Cognitive Status: Impaired/Different from baseline Arousal/Alertness: Awake/alert Orientation Level: Oriented to person;Oriented to time;Disoriented to place;Disoriented to situation Attention: Sustained Sustained Attention: Appears intact Memory: Impaired Memory Impairment: Storage deficit;Retrieval deficit Awareness: Impaired Awareness Impairment: Intellectual impairment Problem Solving: Impaired Problem Solving Impairment: Verbal basic;Functional basic Executive Function: Reasoning Reasoning: Impaired Reasoning Impairment: Verbal basic       Comprehension  Auditory Comprehension Overall Auditory Comprehension: Appears within functional limits for tasks assessed    Expression Expression Primary Mode of Expression: Verbal Verbal Expression Overall Verbal Expression: Appears within functional limits for tasks assessed   Oral / Motor  Oral Motor/Sensory Function Overall Oral Motor/Sensory Function: Mild impairment Facial ROM: Reduced left;Suspected CN VII (facial) dysfunction Facial Symmetry: Abnormal symmetry left;Suspected CN VII (facial) dysfunction Facial Strength: Reduced left;Suspected CN VII (facial) dysfunction Lingual Symmetry: Abnormal symmetry left;Suspected CN XII (hypoglossal) dysfunction Motor Speech Overall Motor  Speech: Impaired Respiration: Within functional limits Phonation: Breathy;Hoarse Resonance: Within functional limits Articulation: Within functional limitis Intelligibility: Intelligibility reduced Phrase: 50-74% accurate            Juan Townsend, M.A.,  CCC-SLP Speech Language Pathology, Acute Rehabilitation Services  Secure Chat preferred [phone]  02/20/2024, 12:12 PM

## 2024-02-20 NOTE — Evaluation (Addendum)
 Clinical/Bedside Swallow Evaluation Patient Details  Name: Juan Townsend MRN: [id number] Date of Birth: 1975-10-04  Today's Date: 02/20/2024 Time: SLP Start Time (ACUTE ONLY): 4098 SLP Stop Time (ACUTE ONLY): 0959 SLP Time Calculation (min) (ACUTE ONLY): 21 min  Past Medical History:  Past Medical History:  Diagnosis Date   HIV (human immunodeficiency virus infection) (HCC)    Hypercholesteremia    Hypertension    Stroke [facility])    Past Surgical History: History reviewed. No pertinent surgical history. HPI:  Juan Townsend is a 48 yo male presenting to ED 6/10 with L sided weakness, falls, and L eye vision changes. 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 Pt passed the yale swallow screen 6/10 but RN noted increased coughing with liquids and SLP was consulted. PMH includes CVA without residual deficits, HIV, hypercholesteremia, HTN    Assessment / Plan / Recommendation  Clinical Impression  SLP consulted after RN observed increased coughing with thin liquids since initially passing the swallow screen 6/10 and consuming a regular diet. He presents with L CN VII deficits and a vocal quality that is characterized by both breathiness and hoarseness with intermittent periods of aphonia. Note CTA Head/Neck with concern for L VF paralysis in the medial position. Single sips of water and bites of puree result in multiple swallows and intermittent throat clearance. When challenged with sequential sips of water, pt coughed forcefully but it lacked crispness. Recommend POs continue to be held pending completion of a FEES for further assessment, which is tentatively planned later this date. Will f/u. SLP Visit Diagnosis: Dysphagia, unspecified (R13.10)    Aspiration Risk  Mild aspiration risk    Diet Recommendation NPO  except meds    Medication Administration: Crushed with puree    Other  Recommendations Oral Care Recommendations: Oral care QID;Oral care prior to ice chip/H20     Assistance Recommended at Discharge    Functional Status Assessment Patient has had a recent decline in their functional status and demonstrates the ability to make significant improvements in function in a reasonable and predictable amount of time.  Frequency and Duration min 2x/week  2 weeks       Prognosis Prognosis for improved oropharyngeal function: Good Barriers to Reach Goals: Cognitive deficits      Swallow Study   General HPI: Juan Townsend is a 48 yo male presenting to ED 6/10 with L sided weakness, falls, and L eye vision changes. 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 Pt passed the yale swallow screen 6/10 but RN noted increased coughing with liquids and SLP was consulted. PMH includes CVA without residual deficits, HIV, hypercholesteremia, HTN Type of Study: Bedside Swallow Evaluation Previous Swallow Assessment: none in chart Diet Prior to this Study: Regular;Thin liquids (Level 0) Temperature Spikes Noted: No Respiratory Status: Room air History of Recent Intubation: No Behavior/Cognition: Alert;Cooperative;Requires cueing Oral Cavity Assessment: Within Functional Limits Oral Care Completed by SLP: No Vision: Functional for self-feeding Self-Feeding Abilities: Needs assist Patient Positioning: Upright in bed Baseline Vocal Quality: Breathy;Hoarse Volitional Cough: Strong Volitional Swallow: Unable to elicit    Oral/Motor/Sensory Function Overall Oral Motor/Sensory Function: Mild impairment Facial ROM: Reduced  left;Suspected CN VII (facial) dysfunction Facial Symmetry: Abnormal symmetry left;Suspected CN VII (facial)  dysfunction Facial Strength: Reduced left;Suspected CN VII (facial) dysfunction Lingual Symmetry: Abnormal symmetry left;Suspected CN XII (hypoglossal) dysfunction   Ice Chips Ice chips: Not tested   Thin Liquid Thin Liquid: Impaired Presentation: Straw Pharyngeal  Phase Impairments: Multiple swallows;Throat Clearing - Immediate;Cough - Immediate    Nectar Thick Nectar Thick Liquid: Not tested   Honey Thick Honey Thick Liquid: Not tested   Puree Puree: Impaired Presentation: Spoon Pharyngeal Phase Impairments: Multiple swallows;Throat Clearing - Immediate   Solid     Solid: Not tested      [name], M.A., CCC-SLP Speech Language Pathology, Acute Rehabilitation Services  Secure Chat preferred [phone]  02/20/2024,12:02 PM

## 2024-02-20 NOTE — Progress Notes (Signed)
 Inpatient Rehab Coordinator Note:  I met with patient at bedside to discuss CIR recommendations and goals/expectations of CIR stay.  We reviewed 3 hrs/day of therapy, physician follow up, and average length of stay 2 weeks (dependent upon progress) with goals of supervision to min assist.  He confirms uninsured and agreeable to medicaid screen.  He states he's never had medicaid though appear there is an inactive policy in his account history.  I will forward to [employer] so they can assess.  I also reviewed caregiver support and he confirms he lives with his brother who works nights.  I asked about sister listed in chart (brother is not listed), and pt prefers brother be primary contact but does not know his number. He does not want me to call [facility].  I left my card and asked that his brother call me when he comes to visit.  Will follow.    Loye Rumble, PT, DPT Admissions Coordinator [phone] 02/20/24  12:00 PM
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
## 2024-02-20 NOTE — Progress Notes (Addendum)
 STROKE TEAM PROGRESS NOTE    SIGNIFICANT HOSPITAL EVENTS  6/10 overnight: Neurology Dr. Jeraline Moment was called due to change in speech.  CT angio and repeat imaging performed due to suspicion for stroke at the time.  On exam, he was noted to be mumbling incomprehensibly with mild weakness on the left upper extremity.  CT angio showed distal ACA occlusion and perfusion deficits in left frontal lobe indicating established core infarct.  Patient is outside the window for intervention since last known well greater than 24 hours.  Repeat CT negative. 6/11: Repeat CTH shows slightly increased prominence of 7mm hypodensity posterior right internal capsule.   INTERIM HISTORY/SUBJECTIVE  No family at bedside.  Patient is awake, alert, states he is in hospital and that he had a stroke with mild dysarthria. Word-fining difficulty intermittently with expressive aphasia with further conversation. Able to follow commands. Dense left hemiplegia continues.   Discharge planning for CIR.   OBJECTIVE  CBC    Component Value Date/Time   WBC 5.4 02/20/2024 0606   RBC 3.54 (L) 02/20/2024 0606   HGB 11.0 (L) 02/20/2024 0606   HCT 30.9 (L) 02/20/2024 0606   PLT 109 (L) 02/20/2024 0606   MCV 87.3 02/20/2024 0606   MCH 31.1 02/20/2024 0606   MCHC 35.6 02/20/2024 0606   RDW 13.2 02/20/2024 0606   LYMPHSABS 0.9 02/18/2024 1310   MONOABS 0.8 02/18/2024 1310   EOSABS 0.0 02/18/2024 1310   BASOSABS 0.1 02/18/2024 1310    BMET    Component Value Date/Time   NA 136 02/20/2024 0606   K 3.4 (L) 02/20/2024 0606   CL 100 02/20/2024 0606   CO2 24 02/20/2024 0606   GLUCOSE 98 02/20/2024 0606   BUN 24 (H) 02/20/2024 0606   CREATININE 2.17 (H) 02/20/2024 0606   CREATININE 1.24 10/17/2023 1622   CALCIUM 9.2 02/20/2024 0606   EGFR 73 10/17/2023 1622   GFRNONAA 37 (L) 02/20/2024 0606    IMAGING past 24 hours CT HEAD WO CONTRAST ( ) Result Date: 02/20/2024 CLINICAL DATA:  Follow-up examination for stroke.  EXAM: CT HEAD WITHOUT CONTRAST TECHNIQUE: Contiguous axial images were obtained from the base of the skull through the vertex without intravenous contrast. RADIATION DOSE REDUCTION: This exam was performed according to the departmental dose-optimization program which includes automated exposure control, adjustment of the mA and/or kV according to patient size and/or use of iterative reconstruction technique. COMPARISON:  Comparison made with prior CT from 02/19/2024 as well as earlier studies. FINDINGS: Brain: Cerebral volume within normal limits. Patchy hypodensity involving the supratentorial cerebral white matter, overall slightly worse within the left cerebral hemisphere as compared to the right, stable from prior. No acute intracranial hemorrhage. There is slightly increased prominence of a 7 mm hypodensity involving the posterior limb of the right internal capsule (series 3, image 13). While this could be due to differences in technique/volume averaging, a possible small evolving acute ischemic nonhemorrhagic infarct could be present. No other visible large vessel territory infarct. No mass lesion, mass effect or midline shift. No hydrocephalus. No extra-axial fluid collection. Vascular: No abnormal hyperdense vessel. Calcified atherosclerosis present about the skull base. Skull: Scalp soft tissues within normal limits.  Calvarium intact. Sinuses/Orbits: Right gaze noted. Globes orbital soft tissues otherwise unremarkable. Visualized paranasal sinuses are clear. Mastoid air cells and middle ear cavities remain clear as well. Other: None. IMPRESSION: 1. Slightly increased prominence of a 7 mm hypodensity involving the posterior limb of the right internal capsule. While this could be due  to differences in technique/volume averaging, a possible small evolving acute ischemic nonhemorrhagic infarct could be considered in the correct clinical setting. Attention at follow-up recommended. 2. No other new acute  intracranial abnormality. Electronically Signed   By: Virgia Griffins M.D.   On: 02/20/2024 04:05    Vitals:   02/20/24 0600 02/20/24 0700 02/20/24 0717 02/20/24 0800  BP: (!) 152/104 (!) 162/107 (!) 170/107 (!) 178/119  Pulse: 78 74 74 80  Resp: 17 10 11 16   Temp:      TempSrc:      SpO2: 99% 96% 98% 100%  Weight:      Height:         PHYSICAL EXAM General:  Alert, well-nourished, well-developed patient in no acute distress Psych:  Mood and affect appropriate for situation CV: Regular rate and rhythm on monitor Respiratory:  Regular, unlabored respirations on room air  NEURO:  Mental Status: Awake, alert.  Oriented to age, place but not to time or situation. Speech/Language: Mild dysarthria.  Expressive aphasia with hesitancy and word finding difficulty--has improved since yesterday's exam as patient is able to state where he is and why, able to repeat phrase and short sentences in a soft, dysarthric voice. Patient is able to follow simple commands.  Cranial Nerves:  II: PERRL. Visual fields full.  III, EOMI.  No gaze palsy.  Tracks examiner fully V: Sensation is intact to light touch and symmetrical to face.  VII: Left facial droop, improved VIII: hearing intact to voice. IX, X: Palate elevates symmetrically. Phonation is normal.  UX:NATFTDDU shrug 5/5. XII: Tongue protrusion to left. Motor: 5/5 strength RUE/RLE LUE: Space proximal 0/5, bicep 2/5, tricep 1/5, grip 1/5.  Unable to move laterally on bed, unble to lift.  LLE: 1/5 proximal, 0/5 toe movement, dorsal/plantarflexion. Tone: is normal and bulk is normal Sensation- Intact to light touch bilaterally.  No sensory neglect Coordination: FTN intact on right Gait- deferred  Most Recent NIH 14    ASSESSMENT/PLAN  Juan Townsend is a 48 y.o. male with history of previous stroke 8 years ago with no residual deficits, HLD, HIV who was brought in by EMS as a code stroke after multiple falls.  He was severely  hypertensive with SBP in the 200s.  His last known well was 6/9 at 8 PM.  Home medications include aspirin.  CT head negative for acute intracranial abnormality.  Patient had neurochange overnight which led to repeat CT and CTA being completed.  CTA showed right ACA occlusion. NIH on Admission: 5.  Patient outside the window for intervention.  Stroke: Likely right internal capsule infarcts, etiology likely small vessel disease due to hypertensive emergency in patient with multiple risk factors Code Stroke CT head No acute intracranial hemorrhage or acute cortical infarction, left frontal old infarct CTA head & neck - RIGHT A3 occlusion with reconstitution at the right A4 segment.  Bilateral M2 and left M3 stenosis. Repeat CT head 6/11 Stable head CT.  No acute intracranial abnormality MRI Unable to obtain MRI due to shrapnel near a vertebral artery Repeat CT: Slightly increased prominence of 7mm hypodensity posterior right internal capsule.  2D Echo: EF 65-70%, Severe LVH  LDL 99 HgbA1c 4.4 UDS positive for THC VTE prophylaxis -Lovenox aspirin 325 mg daily prior to admission, continue aspirin 81 mg daily and clopidogrel 75 mg daily for 3 weeks and then aspirin 81mg  alone  Can change to monotherapy if thrombocytopenia worsens Therapy recommendations:  CIR Disposition:  pending  Hx of Stroke/TIA  August 2019, Presenting with acute right-sided weakness and dysarthria, NIH 4, Given TpA.  Not able to get MRI due to shrapnel near [place].  CT repeat negative for acute infarct.  CTA head and neck unremarkable. LDL: 125, A1c 5.2%. Discharged on DAPT for 21 days then aspirin 81 mg and Lipitor 20  Hypertensive urgency Home meds:  lopressor  25mg  BID stable on high end Off Cleviprex  PRN Labetalol and Hydralazine Continue p.o. Norvasc and metoprolol , increased today  Add hydralazine Gradually normalize BP in 2-4 days Long-term BP goal normotensive  Hyperlipidemia Home meds: None, patient was not taking  Lipitor (noted in his follow-up notes) LDL 99, goal < 70 Add Lipitor 20 mg Continue statin at discharge  Tobacco Abuse Patient smokes a pack per day       Ready to quit? 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 AKI on CKD, creatinine 2.75--2.51--2.20-2.17, on IV fluid at 150 Unsure of patients baseline Creatinine, this may be elevated chronically secondary to patient's uncontrolled hypertension and HIV status.  HIV positive Biktarvy PTA. Continue CD4 = 291  Hospital day # 2   Pt seen by Neuro NP/APP with MD. Note/plan to be edited by MD as needed.    Audrene Lease, DNP, AGACNP-BC [facility] Please use AMION for contact information & EPIC for messaging.   ATTENDING NOTE: I reviewed above note and agree with the assessment and plan. Pt was seen and examined.   No family at bedside.  Patient reclining in bed, lethargic, soft voice, able to answer orientation questions, orientated to place and age but not to time.  Still has left upper extremity bicep 2/5 and left lower extremity 2 -/5 proximal, otherwise 0/5 left upper and lower extremity.  Did not pass swallow, still NPO.  On IV fluid.  BP still on the high end, continue Norvasc and metoprolol , add hydralazine.  Gradually normalize BP in 2 to 3 days.  Continue DAPT for 3 weeks and then aspirin alone.  Continue Lipitor.  May consider core track for tube feeding tomorrow.  For detailed assessment and plan, please refer to above as I have made changes wherever appropriate.  Discussed with Dr. Villa Greaser CCM.  Neurology will sign off. Please call with questions. Pt will follow  up with stroke clinic NP at [facility] in about 4 weeks. Thanks for the consult.  Consuelo Denmark, MD PhD Stroke Neurology 02/20/2024 6:22 PM     To contact Stroke Continuity provider, please refer to [email or web address]. After hours, contact General Neurology

## 2024-02-21 ENCOUNTER — Inpatient Hospital Stay (HOSPITAL_COMMUNITY): Payer: MEDICAID

## 2024-02-21 DIAGNOSIS — I63522 Cerebral infarction due to unspecified occlusion or stenosis of left anterior cerebral artery: Secondary | ICD-10-CM

## 2024-02-21 LAB — BASIC METABOLIC PANEL WITH GFR
Anion gap: 10 (ref 5–15)
BUN: 20 mg/dL (ref 6–20)
CO2: 21 mmol/L — ABNORMAL LOW (ref 22–32)
Calcium: 8.7 mg/dL — ABNORMAL LOW (ref 8.9–10.3)
Chloride: 105 mmol/L (ref 98–111)
Creatinine, Ser: 2.18 mg/dL — ABNORMAL HIGH (ref 0.61–1.24)
GFR, Estimated: 37 mL/min — ABNORMAL LOW (ref >60)
Glucose, Bld: 88 mg/dL (ref 70–99)
Potassium: 3.5 mmol/L (ref 3.5–5.1)
Sodium: 136 mmol/L (ref 135–145)

## 2024-02-21 LAB — CBC
HCT: 28.3 % — ABNORMAL LOW (ref 39.0–52.0)
Hemoglobin: 9.9 g/dL — ABNORMAL LOW (ref 13.0–17.0)
MCH: 30.9 pg (ref 26.0–34.0)
MCHC: 35 g/dL (ref 30.0–36.0)
MCV: 88.4 fL (ref 80.0–100.0)
Platelets: 122 10*3/uL — ABNORMAL LOW (ref 150–400)
RBC: 3.2 MIL/uL — ABNORMAL LOW (ref 4.22–5.81)
RDW: 13.3 % (ref 11.5–15.5)
WBC: 4.4 10*3/uL (ref 4.0–10.5)
nRBC: 0 % (ref 0.0–0.2)

## 2024-02-21 MED ORDER — HYDRALAZINE HCL 50 MG PO TABS
50.0000 mg | ORAL_TABLET | Freq: Three times a day (TID) | ORAL | Status: DC
  Administered 2024-02-21 – 2024-02-22 (×3): 50 mg via ORAL
  Filled 2024-02-21 (×3): qty 1

## 2024-02-21 MED ORDER — HYDRALAZINE HCL 25 MG PO TABS: 25.0000 mg | ORAL_TABLET | Freq: Once | ORAL | Status: DC

## 2024-02-21 NOTE — Progress Notes (Signed)
 Physical Therapy Treatment Patient Details Name: Juan Townsend MRN: [id number] DOB: August 31, 1976 Today's Date: 02/21/2024   History of Present Illness The pt is a 48 yo male presenting 6/10 with left sided weakness, falls, and L eye vision changes. 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 Fall out of chair on 6/11. PMH includes: CVA without residual deficits, HIV, hypercholesteremia, and HTN.    PT Comments  Pt is progressing consistently towards goals. Currently pt is Min- Mod A for bed mobility, 2 person Min A for sit to stand and progressed to SBA for sitting EOB. Pt continues with L neglect which has improved from last session. Due to pt current functional status, home set up and available assistance at home recommending skilled physical therapy services  3 hours/day in order to address strength, balance and functional mobility to decrease risk for falls, injury, immobility, skin break down and re-hospitalization.      If plan is discharge home, recommend the following: Two people to help with walking and/or transfers;Assistance with cooking/housework;Assist for transportation;Help with stairs or ramp for entrance;Supervision due to cognitive status     Equipment Recommendations  Wheelchair (measurements PT);Wheelchair cushion (measurements PT);BSC/3in1       Precautions / Restrictions Precautions Precautions: Fall Recall of Precautions/Restrictions: Impaired Precaution/Restrictions Comments: permissive HTN, high fall risk (fell out of chair 6/11) Restrictions Weight Bearing Restrictions Per Provider Order: No     Mobility  Bed Mobility Overal bed mobility: Needs Assistance Bed Mobility: Supine to Sit, Sit to Supine Rolling: Min assist   Supine to sit: Min assist Sit to supine: Mod assist   General bed mobility comments: cues for sequencing. Min A for trunk to mid line and Mod for LE to bed at end of  session    Transfers Overall transfer level: Needs assistance Equipment used: Rolling walker (2 wheels) Transfers: Sit to/from Stand Sit to Stand: Min assist, +2 safety/equipment, +2 physical assistance           General transfer comment: Min A +2 for momentum to get to standing and assist at LUE for support on RW due to weakness in the LUE. Pt performed 3x during session with each consecutive stand better than previously with improve upright posture and increased time up to 45 seconds in standing with upright posture.    Ambulation/Gait     General Gait Details: unable to safely progress to ambulating yet, poor standing balance    Modified Rankin (Stroke Patients Only) Modified Rankin (Stroke Patients Only) Pre-Morbid Rankin Score: No symptoms Modified Rankin: Severe disability     Balance Overall balance assessment: Needs assistance Sitting-balance support: Single extremity supported, Feet supported Sitting balance-Leahy Scale: Poor Sitting balance - Comments: L lateral and posterior lean noted, progressed from needing Min A initially to needing SBA for static sitting balance with R UE support Postural control: Left lateral lean, Posterior lean Standing balance support: Bilateral upper extremity supported, During functional activity Standing balance-Leahy Scale: Poor Standing balance comment: Posterior and L lateral lean noted, needing min A +2 and tactile cues at L quads and hips to extend.  Multi modal cues for upright posture which pt was able to achieve well        Communication Communication Communication: Impaired Factors Affecting Communication: Reduced clarity of speech;Difficulty expressing self  Cognition Arousal: Alert Behavior During Therapy: Flat affect   PT - Cognitive impairments: Difficult to assess Difficult to assess due to: Impaired  communication Orientation impairments: Time     PT - Cognition Comments: Pt speaks softly and with short  phrases. Needed extra time to find the words at a point. Improved awareness of lean and more alert to L side this session Following commands: Impaired Following commands impaired: Follows one step commands with increased time, Follows multi-step commands inconsistently    Cueing Cueing Techniques: Verbal cues, Gestural cues, Tactile cues, Visual cues     General Comments General comments (skin integrity, edema, etc.): No signs/symptoms of cardiac/respiratory distress during session      Pertinent Vitals/Pain Pain Assessment Pain Assessment: No/denies pain Pain Intervention(s): Monitored during session     PT Goals (current goals can now be found in the care plan section) Acute Rehab PT Goals Patient Stated Goal: return to independence PT Goal Formulation: With patient Time For Goal Achievement: 03/04/24 Potential to Achieve Goals: Good Progress towards PT goals: Progressing toward goals    Frequency    Min 3X/week      PT Plan  Continue with current POC        AM-PAC PT 6 Clicks Mobility   Outcome Measure  Help needed turning from your back to your side while in a flat bed without using bedrails?: A Little Help needed moving from lying on your back to sitting on the side of a flat bed without using bedrails?: A Little Help needed moving to and from a bed to a chair (including a wheelchair)?: A Lot Help needed standing up from a chair using your arms (e.g., wheelchair or bedside chair)?: A Lot Help needed to walk in hospital room?: Total Help needed climbing 3-5 steps with a railing? : Total 6 Click Score: 12    End of Session Equipment Utilized During Treatment: Gait belt Activity Tolerance: Patient tolerated treatment well Patient left: with call bell/phone within reach;in bed;with bed alarm set Nurse Communication: Mobility status PT Visit Diagnosis: Unsteadiness on feet (R26.81);Other abnormalities of gait and mobility (R26.89);Repeated falls (R29.6);Muscle  weakness (generalized) (M62.81);Hemiplegia and hemiparesis;Other symptoms and signs involving the nervous system (R29.898) Hemiplegia - Right/Left: Left Hemiplegia - dominant/non-dominant: Non-dominant Hemiplegia - caused by: Cerebral infarction     Time: 1225-1248 PT Time Calculation (min) (ACUTE ONLY): 23 min  Charges:    $Therapeutic Activity: 23-37 mins PT General Charges $$ ACUTE PT VISIT: 1 Visit                     Sloan Duncans, DPT, CLT  Acute Rehabilitation Services Office: [phone] (Secure chat preferred)    Jenice Mitts 02/21/2024, 2:15 PM

## 2024-02-21 NOTE — Progress Notes (Signed)
 Modified Barium Swallow Study  Patient Details  Name: Juan Townsend MRN: [id number] Date of Birth: 1976-03-05  Today's Date: 02/21/2024  Modified Barium Swallow completed.  Full report located under Chart Review in the Imaging Section.  History of Present Illness Juan Townsend is a 48 yo male presenting to ED 6/10 with L sided weakness, falls, and L eye vision changes. 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 Pt passed the yale swallow screen 6/10 but RN noted increased coughing with liquids and SLP was consulted. FEES 6/12 inconclusive in regards to swallowing function and safety but showed bilateral arytenoid movement, but limited and incongruent in addition to minimal glottic space at rest, L aryepiglottic fold erythema, and epiglottic excresence on the R side. PMH includes CVA without residual deficits, HIV, hypercholesteremia, HTN   Clinical Impression Pt exhibits moderate oropharyngeal dysphagia which is suspected to be impacted by cognition and incongruent arytenoid movement observed on the FEES 6/12. Timing is an area of impairment with consistent swallow initiation at the pyrifrom sinuses. This results in consistent laryngeal penetration before the swallow, which is intermittently expelled during the swallow. With thin and nectar thick liquids particularly, the bolus progresses to the level of the vocal folds and quickly past resulting in silent aspiration (PAS 8). Although spontaneous coughing was intermittently observed in the absence of aspiration, he was unable to cough or clear his throat when cued (question effort as agitation increased throughout the study). Pt swallows the majority of each bolus but oral residue subsequently progresses to the level of the pyriform sinuses and he only intermittently  initiates a swallow response without prompting. This results in penetration after the swallow, again most notably with thin and nectar thick liquids. A L head turn is effective at preventing airway invasion during the swallow with nectar thick liquids but not with thin liquids. Attempted a R head tilt, which is also ineffective. Honey thick liquids result in superficial penetration (PAS 2, considered WFL). There is no significant pharyngeal residue or laryngeal penetration with purees or solids. While dysphagia may be somewhat chronic nature, pt is at increased risk for adverse reactions to aspiration in light of acute stroke. For now, recommend Dys 3 diet with honey thick liquids and frequent oral care. Pt may benefit from further education before consistently using effective compensatory strategies, which SLP will f/u to provide.  Factors that may increase risk of adverse event in presence of aspiration [name] & [name] 2021): Poor general health and/or compromised immunity;Reduced cognitive function;Inadequate oral hygiene;Weak cough  Swallow Evaluation Recommendations Recommendations: PO diet PO Diet Recommendation: Dysphagia 3 (Mechanical soft);Moderately thick liquids (Level 3, honey thick) Liquid Administration via: Spoon;Cup Medication Administration: Whole meds with puree Supervision: Full supervision/cueing for swallowing strategies;Staff to assist with self-feeding Swallowing strategies  : Minimize environmental distractions;Slow rate;Small bites/sips;Head turn left during swallowing Postural changes: Position pt fully upright for meals Oral care recommendations: Oral care QID (4x/day);Oral care before PO Recommended consults: Consider ENT consultation Caregiver Recommendations: Avoid jello, ice cream, thin soups, popsicles;Remove water  pitcher    [name], M.A., CCC-SLP Speech Language Pathology, Acute Rehabilitation Services  Secure Chat  preferred [phone]  02/21/2024,12:07 PM

## 2024-02-21 NOTE — Plan of Care (Signed)
  Problem: Education: Goal: Knowledge of General Education information will improve Description: Including pain rating scale, medication(s)/side effects and non-pharmacologic comfort measures Outcome: Progressing   Problem: Nutrition: Goal: Adequate nutrition will be maintained Outcome: Progressing   Problem: Education: Goal: Knowledge of disease or condition will improve Outcome: Progressing Goal: Knowledge of secondary prevention will improve (MUST DOCUMENT ALL) Outcome: Progressing
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
## 2024-02-22 LAB — GLUCOSE, CAPILLARY
Glucose-Capillary: 104 mg/dL — ABNORMAL HIGH (ref 70–99)
Glucose-Capillary: 104 mg/dL — ABNORMAL HIGH (ref 70–99)

## 2024-02-22 LAB — BASIC METABOLIC PANEL WITH GFR
Anion gap: 9 (ref 5–15)
BUN: 21 mg/dL — ABNORMAL HIGH (ref 6–20)
CO2: 24 mmol/L (ref 22–32)
Calcium: 8.7 mg/dL — ABNORMAL LOW (ref 8.9–10.3)
Chloride: 104 mmol/L (ref 98–111)
Creatinine, Ser: 2.51 mg/dL — ABNORMAL HIGH (ref 0.61–1.24)
GFR, Estimated: 31 mL/min — ABNORMAL LOW (ref >60)
Glucose, Bld: 104 mg/dL — ABNORMAL HIGH (ref 70–99)
Potassium: 3.4 mmol/L — ABNORMAL LOW (ref 3.5–5.1)
Sodium: 137 mmol/L (ref 135–145)

## 2024-02-22 MED ORDER — HYDRALAZINE HCL 50 MG PO TABS
75.0000 mg | ORAL_TABLET | Freq: Three times a day (TID) | ORAL | Status: DC
  Administered 2024-02-22 – 2024-02-23 (×3): 75 mg via ORAL
  Filled 2024-02-22 (×3): qty 1

## 2024-02-22 MED ORDER — HYDRALAZINE HCL 25 MG PO TABS
25.0000 mg | ORAL_TABLET | Freq: Once | ORAL | Status: AC
  Administered 2024-02-22: 25 mg via ORAL
  Filled 2024-02-22: qty 1

## 2024-02-22 MED ORDER — POTASSIUM CHLORIDE CRYS ER 20 MEQ PO TBCR
40.0000 meq | EXTENDED_RELEASE_TABLET | Freq: Once | ORAL | Status: AC
  Administered 2024-02-22: 40 meq via ORAL
  Filled 2024-02-22: qty 2
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
## 2024-02-22 NOTE — Plan of Care (Signed)
  Problem: Education: Goal: Knowledge of General Education information will improve Description: Including pain rating scale, medication(s)/side effects and non-pharmacologic comfort measures Outcome: Not Progressing   Problem: Activity: Goal: Risk for activity intolerance will decrease Outcome: Not Progressing   Problem: Nutrition: Goal: Adequate nutrition will be maintained Outcome: Not Progressing   Problem: Elimination: Goal: Will not experience complications related to bowel motility Outcome: Not Progressing

## 2024-02-22 NOTE — Progress Notes (Signed)
 Occupational Therapy Treatment Patient Details Name: Juan Townsend MRN: [id number] DOB: November 12, 1975 Today's Date: 02/22/2024   History of present illness The pt is a 48 yo male presenting 6/10 with left sided weakness, falls, and L eye vision changes. 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 Fall out of chair on 6/11. PMH includes: CVA without residual deficits, HIV, hypercholesteremia, and HTN.   OT comments  Patient demonstrating progress with OT treatment. Patient able to get to EOB with min assist. Patient able to perform grooming and gown change while seated on EOB with mod assist. Patient requiring min to CGA for sitting balance while tolerating PROM exercises to LUE. Patient performed one stand from EOB with face to face technique to move closer to [facility] before returning to supine. Patient will benefit from intensive inpatient follow-up therapy, >3 hours/day. Acute OT to continue to follow to address established goals to facilitate DC to next venue of care.        If plan is discharge home, recommend the following:  Two people to help with walking and/or transfers;Assistance with cooking/housework;Two people to help with bathing/dressing/bathroom;Direct supervision/assist for medications management;Direct supervision/assist for financial management;Assist for transportation;Help with stairs or ramp for entrance;Assistance with feeding   Equipment Recommendations  Other (comment) (defer)    Recommendations for Other Services      Precautions / Restrictions Precautions Precautions: Fall Recall of Precautions/Restrictions: Impaired Precaution/Restrictions Comments: permissive HTN, high fall risk (fell out of chair 6/11) Restrictions Weight Bearing Restrictions Per Provider Order: No       Mobility Bed Mobility Overal bed mobility: Needs Assistance Bed Mobility: Supine to Sit, Sit to Supine Rolling: Min  assist   Supine to sit: Min assist Sit to supine: Mod assist   General bed mobility comments: performed rolling to straighten bed pads. requiring assistance with trunk to get to EOB and assistance with LLE to return to supine    Transfers Overall transfer level: Needs assistance Equipment used: None Transfers: Sit to/from Stand Sit to Stand: Mod assist           General transfer comment: stood with face to face technique to move towards [facility] with mod assist     Balance Overall balance assessment: Needs assistance Sitting-balance support: Single extremity supported, Feet supported Sitting balance-Leahy Scale: Poor Sitting balance - Comments: posterior and left lateral leaning on EOB with min to CGA for balance   Standing balance support: Bilateral upper extremity supported, During functional activity Standing balance-Leahy Scale: Poor Standing balance comment: reliant on external support                           ADL either performed or assessed with clinical judgement   ADL Overall ADL's : Needs assistance/impaired     Grooming: Moderate assistance;Sitting           Upper Body Dressing : Moderate assistance;Sitting Upper Body Dressing Details (indicate cue type and reason): to change gown                        Extremity/Trunk Assessment Upper Extremity Assessment Upper Extremity Assessment: Generalized weakness LUE Sensation: decreased light touch LUE Coordination: decreased fine motor;decreased gross motor            Vision       Perception     Praxis     Communication Communication Communication:  Impaired Factors Affecting Communication: Reduced clarity of speech;Difficulty expressing self   Cognition Arousal: Alert Behavior During Therapy: Flat affect Cognition: No family/caregiver present to determine baseline, Cognition impaired     Awareness: Intellectual awareness impaired, Online awareness impaired Memory impairment  (select all impairments): Short-term memory, Working memory Attention impairment (select first level of impairment): Focused attention Executive functioning impairment (select all impairments): Organization, Sequencing, Reasoning, Problem solving OT - Cognition Comments: cues to attend to left, appears to have difficulty with word finding                 Following commands: Impaired Following commands impaired: Follows one step commands with increased time, Follows multi-step commands inconsistently      Cueing   Cueing Techniques: Verbal cues, Gestural cues, Tactile cues, Visual cues  Exercises Exercises: General Upper Extremity General Exercises - Upper Extremity Shoulder Flexion: PROM, Left, 10 reps, Seated Shoulder Extension: PROM, Left, 10 reps, Seated Shoulder ABduction: PROM, Left, 10 reps, Seated Shoulder ADduction: PROM, Left, 10 reps, Seated Elbow Flexion: PROM, Both, 10 reps, Seated Elbow Extension: PROM, Left, 10 reps Wrist Flexion: PROM, Left, 10 reps, Seated Wrist Extension: PROM, Left, 10 reps, Seated    Shoulder Instructions       General Comments      Pertinent Vitals/ Pain       Pain Assessment Pain Assessment: No/denies pain Pain Intervention(s): Monitored during session  Home Living                                          Prior Functioning/Environment              Frequency  Min 2X/week        Progress Toward Goals  OT Goals(current goals can now be found in the care plan section)  Progress towards OT goals: Progressing toward goals  Acute Rehab OT Goals Patient Stated Goal: none stated OT Goal Formulation: With patient Time For Goal Achievement: 03/04/24 Potential to Achieve Goals: Fair ADL Goals Pt Will Perform Grooming: with set-up;sitting Pt Will Perform Upper Body Dressing: sitting;with min assist Pt Will Perform Lower Body Dressing: with mod assist;sit to/from stand;sitting/lateral leans Pt Will  Transfer to Toilet: with mod assist Pt/caregiver will Perform Home Exercise Program: Left upper extremity;Increased ROM;Increased strength Additional ADL Goal #1: Pt will follow all one step commands with 100% accuracy Additional ADL Goal #2: Pt will visually scan to locate items on L side with min cues 4/5 attempts.  Plan      Co-evaluation                 AM-PAC OT 6 Clicks Daily Activity     Outcome Measure   Help from another person eating meals?: A Little Help from another person taking care of personal grooming?: A Lot Help from another person toileting, which includes using toliet, bedpan, or urinal?: Total Help from another person bathing (including washing, rinsing, drying)?: A Lot Help from another person to put on and taking off regular upper body clothing?: A Lot Help from another person to put on and taking off regular lower body clothing?: Total 6 Click Score: 11    End of Session Equipment Utilized During Treatment: Gait belt  OT Visit Diagnosis: Unsteadiness on feet (R26.81);Muscle weakness (generalized) (M62.81);Other symptoms and signs involving cognitive function;Low vision, both eyes (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

## 2024-02-23 ENCOUNTER — Inpatient Hospital Stay (HOSPITAL_COMMUNITY): Payer: MEDICAID

## 2024-02-23 LAB — URINALYSIS, ROUTINE W REFLEX MICROSCOPIC
Bilirubin Urine: NEGATIVE
Glucose, UA: 50 mg/dL — AB
Hgb urine dipstick: NEGATIVE
Ketones, ur: NEGATIVE mg/dL
Leukocytes,Ua: NEGATIVE
Nitrite: NEGATIVE
Protein, ur: 30 mg/dL — AB
Specific Gravity, Urine: 1.013 (ref 1.005–1.030)
pH: 6 (ref 5.0–8.0)

## 2024-02-23 LAB — BASIC METABOLIC PANEL WITH GFR
Anion gap: 13 (ref 5–15)
BUN: 24 mg/dL — ABNORMAL HIGH (ref 6–20)
CO2: 21 mmol/L — ABNORMAL LOW (ref 22–32)
Calcium: 9.2 mg/dL (ref 8.9–10.3)
Chloride: 103 mmol/L (ref 98–111)
Creatinine, Ser: 2.61 mg/dL — ABNORMAL HIGH (ref 0.61–1.24)
GFR, Estimated: 30 mL/min — ABNORMAL LOW (ref >60)
Glucose, Bld: 107 mg/dL — ABNORMAL HIGH (ref 70–99)
Potassium: 3.6 mmol/L (ref 3.5–5.1)
Sodium: 137 mmol/L (ref 135–145)

## 2024-02-23 LAB — GLUCOSE, CAPILLARY
Glucose-Capillary: 139 mg/dL — ABNORMAL HIGH (ref 70–99)
Glucose-Capillary: 135 mg/dL — ABNORMAL HIGH (ref 70–99)
Glucose-Capillary: 123 mg/dL — ABNORMAL HIGH (ref 70–99)
Glucose-Capillary: 99 mg/dL (ref 70–99)
Glucose-Capillary: 145 mg/dL — ABNORMAL HIGH (ref 70–99)
Glucose-Capillary: 257 mg/dL — ABNORMAL HIGH (ref 70–99)

## 2024-02-23 LAB — HIV-1 RNA QUANT-NO REFLEX-BLD
HIV 1 RNA Quant: <20 {copies}/mL
LOG10 HIV-1 RNA: UNDETERMINED {Log_copies}/mL

## 2024-02-23 MED ORDER — ORAL CARE MOUTH RINSE
15.0000 mL | OROMUCOSAL | Status: DC
  Administered 2024-02-23 – 2024-02-24 (×4): 15 mL via OROMUCOSAL

## 2024-02-23 MED ORDER — ORAL CARE MOUTH RINSE
15.0000 mL | OROMUCOSAL | Status: DC
  Administered 2024-02-23 – 2024-02-25 (×5): 15 mL via OROMUCOSAL

## 2024-02-23 MED ORDER — HYDRALAZINE HCL 50 MG PO TABS
100.0000 mg | ORAL_TABLET | Freq: Three times a day (TID) | ORAL | Status: DC
  Administered 2024-02-23 – 2024-02-25 (×7): 100 mg via ORAL
  Filled 2024-02-23 (×7): qty 2

## 2024-02-23 MED ORDER — ENOXAPARIN SODIUM 30 MG/0.3ML IJ SOSY
30.0000 mg | PREFILLED_SYRINGE | INTRAMUSCULAR | Status: DC
  Administered 2024-02-23 – 2024-02-24 (×2): 30 mg via SUBCUTANEOUS
  Filled 2024-02-23 (×2): qty 0.3

## 2024-02-23 MED ORDER — HYDRALAZINE HCL 25 MG PO TABS
25.0000 mg | ORAL_TABLET | Freq: Once | ORAL | Status: AC
  Administered 2024-02-23: 25 mg via ORAL
  Filled 2024-02-23: qty 1

## 2024-02-23 MED ORDER — ORAL CARE MOUTH RINSE: 15.0000 mL | OROMUCOSAL | Status: DC | PRN

## 2024-02-23 MED ORDER — LACTATED RINGERS IV SOLN: INTRAVENOUS | Status: DC

## 2024-02-23 NOTE — Plan of Care (Signed)
  Problem: Safety: Goal: Ability to remain free from injury will improve Outcome: Not Progressing   Problem: Activity: Goal: Risk for activity intolerance will decrease Outcome: Not Progressing   Problem: Education: Goal: Knowledge of General Education information will improve Description: Including pain rating scale, medication(s)/side effects and non-pharmacologic comfort measures Outcome: Not Progressing   Problem: Skin Integrity: Goal: Risk for impaired skin integrity will decrease Outcome: Not Progressing

## 2024-02-23 NOTE — Plan of Care (Signed)
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
## 2024-02-24 LAB — GLUCOSE, CAPILLARY
Glucose-Capillary: 124 mg/dL — ABNORMAL HIGH (ref 70–99)
Glucose-Capillary: 117 mg/dL — ABNORMAL HIGH (ref 70–99)
Glucose-Capillary: 193 mg/dL — ABNORMAL HIGH (ref 70–99)
Glucose-Capillary: 150 mg/dL — ABNORMAL HIGH (ref 70–99)
Glucose-Capillary: 104 mg/dL — ABNORMAL HIGH (ref 70–99)

## 2024-02-24 LAB — BASIC METABOLIC PANEL WITH GFR
Anion gap: 11 (ref 5–15)
BUN: 23 mg/dL — ABNORMAL HIGH (ref 6–20)
CO2: 22 mmol/L (ref 22–32)
Calcium: 8.7 mg/dL — ABNORMAL LOW (ref 8.9–10.3)
Chloride: 100 mmol/L (ref 98–111)
Creatinine, Ser: 2.7 mg/dL — ABNORMAL HIGH (ref 0.61–1.24)
GFR, Estimated: 29 mL/min — ABNORMAL LOW (ref >60)
Glucose, Bld: 104 mg/dL — ABNORMAL HIGH (ref 70–99)
Potassium: 3.8 mmol/L (ref 3.5–5.1)
Sodium: 133 mmol/L — ABNORMAL LOW (ref 135–145)

## 2024-02-24 LAB — TROPONIN I (HIGH SENSITIVITY): Troponin I (High Sensitivity): 34 ng/L — ABNORMAL HIGH (ref <18)

## 2024-02-24 NOTE — Plan of Care (Signed)
   Problem: Nutrition: Goal: Adequate nutrition will be maintained Outcome: Not Progressing

## 2024-02-24 NOTE — Progress Notes (Signed)
 Inpatient Rehab Admissions Coordinator:   Met with patient at bedside.  Brother has not called me to discuss caregiver supports.  I asked about calling sister.  Pt does not respond to me.  He keeps his eyes closed unless I directly call his name, otherwise does not acknowledge me being there.  I will call sister today to discuss caregiver support.   Loye Rumble, PT, DPT Admissions Coordinator [phone] 02/24/24  1:15 PM

## 2024-02-24 NOTE — Consult Note (Signed)
 [facility] Renal Consultation Note  Requesting MD: Catharine Clock, MD  Indication for Consultation: AKI  HPI:  Juan Townsend is a 48 y.o. male. Admitted for CVA requiring ICU admission for Cleviprex  gtt for SBP >220.  Past medical history significant for uncontrolled hypertension, HIV, HLD, Hx of CVA.   Creatinine at presentation 2.75.  Baseline creatinine ~1.2-1.5.  He initially received Cleviprex  drip and NS 75 mL/h.  This improved his creatinine to 2.17.  NS fluid was discontinued on 6/13.  Creatinine began to rise on 6/14 and fluids were restarted on 6/15.  Creatinine continues to rise on fluids to  2.70 prompting nephrology consultation.  Blood pressure has remained elevated with systolics between 150-170.  He is currently on amlodipine  10 mg, hydralazine  100 mg q8h, and metoprolol  50 mg BID.   Neurology recommending normalizing blood pressure in 2 to 4 days on 02/20/2024 with goal BP <140/90.  Bladder scan on 6/15 WNL.  Creat  Date/Time Value Ref Range Status  10/17/2023 04:22 PM 1.24 0.60 - 1.29 mg/dL Final   Creatinine, Ser  Date/Time Value Ref Range Status  02/24/2024 03:55 AM 2.70 (H) 0.61 - 1.24 mg/dL Final  78/46/9629 52:84 AM 2.61 (H) 0.61 - 1.24 mg/dL Final  13/24/4010 27:25 AM 2.51 (H) 0.61 - 1.24 mg/dL Final  36/64/4034 74:25 AM 2.18 (H) 0.61 - 1.24 mg/dL Final  95/63/8756 43:32 AM 2.17 (H) 0.61 - 1.24 mg/dL Final  95/18/8416 60:63 AM 2.20 (H) 0.61 - 1.24 mg/dL Final  01/60/1093 23:55 PM 2.51 (H) 0.61 - 1.24 mg/dL Final  73/22/0254 27:06 PM 2.80 (H) 0.61 - 1.24 mg/dL Final  23/76/2831 51:76 PM 2.75 (H) 0.61 - 1.24 mg/dL Final  16/03/3709 62:69 PM 1.61 (H) 0.61 - 1.24 mg/dL Final  48/54/6270 35:00 AM 1.03 0.61 - 1.24 mg/dL Final     PMHx:   Past Medical History:  Diagnosis Date   HIV (human immunodeficiency virus infection) (HCC)    Hypercholesteremia    Hypertension    Stroke [facility])     History reviewed. No pertinent surgical  history.  Family Hx: History reviewed. No pertinent family history.  Social History:  reports that he has been smoking cigarettes. He does not have any smokeless tobacco history on file. He reports current alcohol use of about 1.0 standard drink of alcohol per week. He reports current drug use. Drug: Marijuana.  Allergies: No Known Allergies  Medications: Prior to Admission medications   Medication Sig Start Date End Date Taking? Authorizing Provider  bictegravir-emtricitabine -tenofovir  AF (BIKTARVY ) 50-200-25 MG TABS tablet Take 1 tablet by mouth daily. Try to take at the same time each day with or without food. 10/24/23  Yes Orlie Bjornstad, MD  metoprolol  tartrate (LOPRESSOR ) 25 MG tablet Take 1 tablet (25 mg total) by mouth 2 (two) times daily. 10/26/23  Yes Early Glisson, MD  aspirin  325 MG tablet Take 325 mg by mouth daily. Patient not taking: Reported on 10/17/2023    [provider]    I have reviewed the patient's current medications.  Labs:  Results for orders placed or performed during the hospital encounter of 02/18/24 (from the past 48 hours)  Glucose, capillary     Status: Abnormal   Collection Time: 02/22/24  8:50 PM  Result Value Ref Range   Glucose-Capillary 104 (H) 70 - 99 mg/dL    Comment: Glucose reference range applies only to samples taken after fasting for at least 8 hours.   Comment 1 Notify RN   Glucose, capillary  Status: Abnormal   Collection Time: 02/22/24 11:57 PM  Result Value Ref Range   Glucose-Capillary 104 (H) 70 - 99 mg/dL    Comment: Glucose reference range applies only to samples taken after fasting for at least 8 hours.  Glucose, capillary     Status: Abnormal   Collection Time: 02/23/24  3:52 AM  Result Value Ref Range   Glucose-Capillary 139 (H) 70 - 99 mg/dL    Comment: Glucose reference range applies only to samples taken after fasting for at least 8 hours.   Comment 1 Notify RN   Basic metabolic panel with GFR     Status: Abnormal    Collection Time: 02/23/24  7:23 AM  Result Value Ref Range   Sodium 137 135 - 145 mmol/L   Potassium 3.6 3.5 - 5.1 mmol/L   Chloride 103 98 - 111 mmol/L   CO2 21 (L) 22 - 32 mmol/L   Glucose, Bld 107 (H) 70 - 99 mg/dL    Comment: Glucose reference range applies only to samples taken after fasting for at least 8 hours.   BUN 24 (H) 6 - 20 mg/dL   Creatinine, Ser 6.96 (H) 0.61 - 1.24 mg/dL   Calcium  9.2 8.9 - 10.3 mg/dL   GFR, Estimated 30 (L) >60 mL/min    Comment: (NOTE) Calculated using the CKD-EPI Creatinine Equation (2021)    Anion gap 13 5 - 15    Comment: Performed at [facility], [address] [place], [place] [address]  Glucose, capillary     Status: Abnormal   Collection Time: 02/23/24  9:42 AM  Result Value Ref Range   Glucose-Capillary 135 (H) 70 - 99 mg/dL    Comment: Glucose reference range applies only to samples taken after fasting for at least 8 hours.  Urinalysis, Routine w reflex microscopic -Urine, Clean Catch     Status: Abnormal   Collection Time: 02/23/24  9:52 AM  Result Value Ref Range   Color, Urine YELLOW YELLOW   APPearance HAZY (A) CLEAR   Specific Gravity, Urine 1.013 1.005 - 1.030   pH 6.0 5.0 - 8.0   Glucose, UA 50 (A) NEGATIVE mg/dL   Hgb urine dipstick NEGATIVE NEGATIVE   Bilirubin Urine NEGATIVE NEGATIVE   Ketones, ur NEGATIVE NEGATIVE mg/dL   Protein, ur 30 (A) NEGATIVE mg/dL   Nitrite NEGATIVE NEGATIVE   Leukocytes,Ua NEGATIVE NEGATIVE   RBC / HPF 6-10 0 - 5 RBC/hpf   WBC, UA 6-10 0 - 5 WBC/hpf   Bacteria, UA FEW (A) NONE SEEN   Squamous Epithelial / HPF 0-5 0 - 5 /HPF   Mucus PRESENT    Amorphous Crystal PRESENT     Comment: Performed at [facility], [address], [place], [place] [address]  Glucose, capillary     Status: Abnormal   Collection Time: 02/23/24 11:38 AM  Result Value Ref Range   Glucose-Capillary 123 (H) 70 - 99 mg/dL    Comment: Glucose reference range applies only to samples taken after fasting for  at least 8 hours.  Glucose, capillary     Status: None   Collection Time: 02/23/24  4:03 PM  Result Value Ref Range   Glucose-Capillary 99 70 - 99 mg/dL    Comment: Glucose reference range applies only to samples taken after fasting for at least 8 hours.  Glucose, capillary     Status: Abnormal   Collection Time: 02/23/24  7:38 PM  Result Value Ref Range  Glucose-Capillary 145 (H) 70 - 99 mg/dL    Comment: Glucose reference range applies only to samples taken after fasting for at least 8 hours.   Comment 1 Notify RN    Comment 2 Document in Chart   Glucose, capillary     Status: Abnormal   Collection Time: 02/23/24 11:09 PM  Result Value Ref Range   Glucose-Capillary 257 (H) 70 - 99 mg/dL    Comment: Glucose reference range applies only to samples taken after fasting for at least 8 hours.   Comment 1 Notify RN    Comment 2 Document in Chart   Basic metabolic panel with GFR     Status: Abnormal   Collection Time: 02/24/24  3:55 AM  Result Value Ref Range   Sodium 133 (L) 135 - 145 mmol/L   Potassium 3.8 3.5 - 5.1 mmol/L   Chloride 100 98 - 111 mmol/L   CO2 22 22 - 32 mmol/L   Glucose, Bld 104 (H) 70 - 99 mg/dL    Comment: Glucose reference range applies only to samples taken after fasting for at least 8 hours.   BUN 23 (H) 6 - 20 mg/dL   Creatinine, Ser 1.19 (H) 0.61 - 1.24 mg/dL   Calcium  8.7 (L) 8.9 - 10.3 mg/dL   GFR, Estimated 29 (L) >60 mL/min    Comment: (NOTE) Calculated using the CKD-EPI Creatinine Equation (2021)    Anion gap 11 5 - 15    Comment: Performed at [facility], [address] [place], [place] [address]  Troponin I (High Sensitivity)     Status: Abnormal   Collection Time: 02/24/24  3:55 AM  Result Value Ref Range   Troponin I (High Sensitivity) 34 (H) <18 ng/L    Comment: (NOTE) Elevated high sensitivity troponin I (hsTnI) values and significant  changes across serial measurements may suggest ACS but many other  chronic and acute conditions  are known to elevate hsTnI results.  Refer to the Links section for chest pain algorithms and additional  guidance. Performed at [facility], [address], [place], [place] [address]   Glucose, capillary     Status: Abnormal   Collection Time: 02/24/24  4:20 AM  Result Value Ref Range   Glucose-Capillary 124 (H) 70 - 99 mg/dL    Comment: Glucose reference range applies only to samples taken after fasting for at least 8 hours.   Comment 1 Notify RN    Comment 2 Document in Chart   Glucose, capillary     Status: Abnormal   Collection Time: 02/24/24  7:42 AM  Result Value Ref Range   Glucose-Capillary 117 (H) 70 - 99 mg/dL    Comment: Glucose reference range applies only to samples taken after fasting for at least 8 hours.   Comment 1 Notify RN    Comment 2 Document in Chart      ROS:  Pertinent items are noted in HPI.  Physical Exam: Vitals:   02/24/24 0419 02/24/24 0741  BP: (!) 150/100 (!) 151/102  Pulse: 75 76  Resp: 18 19  Temp: 97.9 F (36.6 C)   SpO2: 99% 100%     General: ill appearing, NAD  HEENT: moist mucus membranes Heart: RRR, no murmurs on exam  Lungs: clear, no increased WOB Abdomen: soft, non-tender Extremities: no peripheral edema  Neuro: difficulty with speech, appears A&O x4, answers questions appropriately.   Assessment/Plan: AKI on CKD3a: most likely 2/2 to elevated BP and subsequent correction.  Could also be related to TMA and fluid overload. Cr initially improved with fluids now uptrending. Volume status on exam euvolemic. At risk to dehydration in the setting of poor oral intake with dysphagia.  Monitor BMP with new blood pressure medications. Patient would be a good candidate for SGLT2i and ARB once Cr stabilizes and improves.  Thrombocytopenia: Initially 51 admission but has improved to 122.  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  He will need outpatient nephrology follow-up and genetic evaluation. HTN: Improved from admission but, not  at goal <140/90. Continue amlodipine  10 mg, Hydro 100 mg q8h, metoprolol  to 50 mg BID.  Trend BMP at new SBP and allow for equilibrium.  Once creatinine improves and stabilizes can consider adding additional agents.  CVA: per primary team    Clem Currier 02/24/2024, 10:39 AM

## 2024-02-24 NOTE — PMR Pre-admission (Signed)
 PMR Admission Coordinator Pre-Admission Assessment  Patient: Juan Townsend is an 48 y.o., male MRN: [id number] DOB: 11-28-1975 Height: 5' 6 (167.6 cm) Weight: 59.9 kg  Insurance Information HMO:     PPO:      PCP:      IPA:      80/20:     OTHER:  PRIMARY: Medicaid pending      Policy#:       Subscriber:  CM Name:       Phone#:      Fax#:  Pre-Cert#:       Employer:  Benefits:  Phone #:      Name:  Eff. Date:      Deduct:       Out of Pocket Max:       Life Max:  CIR:       SNF:  Outpatient:      Co-Pay:  Home Health:       Co-Pay:  DME:      Co-Pay:  Providers:  SECONDARY:       Policy#:      Phone#:   Artist:       Phone#:    The Data processing manager" for patients in Inpatient Rehabilitation Facilities with attached "Privacy Act Statement-Health Care Records" was provided and verbally reviewed with: Patient and Family  Emergency Contact Information Contact Information     Name Relation Home Work Mobile   Eberlein,James Brother [phone]     Juan Townsend, Juan Townsend   [phone]      Other Contacts   None on File     Current Medical History  Patient Admitting Diagnosis: CVA   History of Present Illness: Pt is a 48 y/o male with 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  In ED BP 261/129, NIHSS 5, Na 129, creatinine 1.61.  CT head showed 6 mm infarct within the mid left frontal lobe white matter, chronic lacunar infarct in the left basal ganglia.  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  Repeat CTs stable.  EF 65-70% with severe LVH.  Neurology recommendations for aspirin  and plavix  x3 weeks then aspirin  alone.  Therapy evaluations completed and pt was recommended for CIR.   Complete NIHSS TOTAL: 10  Patient's medical record from [facility] has been reviewed by the rehabilitation admission coordinator  and physician.  Past Medical History  Past Medical History:  Diagnosis Date   HIV (human immunodeficiency virus infection) (HCC)    Hypercholesteremia    Hypertension    Stroke [facility])     Has the patient had major surgery during 100 days prior to admission? No  Family History   family history is not on file.  Current Medications  Current Facility-Administered Medications:    acetaminophen  (TYLENOL ) tablet 650 mg, 650 mg, Oral, Q4H PRN, Smith, Joshua C, NP, 650 mg at 02/23/24 1650   amLODipine  (NORVASC ) tablet 10 mg, 10 mg, Oral, Daily, Gleason, Laura R, PA-C, 10 mg at 02/24/24 1027   aspirin  EC tablet 81 mg, 81 mg, Oral, Daily, Consuelo Denmark, MD, 81 mg at 02/24/24 2536   atorvastatin  (LIPITOR) tablet 20 mg, 20 mg, Oral, Daily, Lehner, Erin C, NP, 20 mg at 02/24/24 6440   bictegravir-emtricitabine -tenofovir  AF (BIKTARVY ) 50-200-25 MG per tablet 1 tablet, 1 tablet, Oral, Daily, Josiah Nigh, MD, 1 tablet at 02/24/24 [phone]  clopidogrel  (PLAVIX ) tablet 75 mg, 75 mg, Oral, Daily, Pham, Minh Q, RPH-CPP, 75 mg at 02/24/24 0981   docusate sodium  (COLACE) capsule 100 mg, 100 mg, Oral, BID PRN, Smith, Joshua C, NP   enoxaparin  (LOVENOX ) injection 30 mg, 30 mg, Subcutaneous, Q24H, Regalado, Belkys A, MD, 30 mg at 02/23/24 2009   hydrALAZINE  (APRESOLINE ) injection 5 mg, 5 mg, Intravenous, Q2H PRN, Ogan, Okoronkwo U, MD, 5 mg at 02/22/24 0103   hydrALAZINE  (APRESOLINE ) tablet 100 mg, 100 mg, Oral, Q8H, Regalado, Belkys A, MD, 100 mg at 02/24/24 1400   labetalol  (NORMODYNE ) injection 10 mg, 10 mg, Intravenous, Q2H PRN, Lehner, Erin C, NP, 10 mg at 02/21/24 0235   metoprolol  tartrate (LOPRESSOR ) tablet 50 mg, 50 mg, Oral, BID, Alva, Rakesh V, MD, 50 mg at 02/24/24 1914   nicotine  (NICODERM CQ  - dosed in mg/24 hours) patch 21 mg, 21 mg, Transdermal, Daily, Consuelo Denmark, MD, 21 mg at 02/24/24 7829   Oral care mouth rinse, 15 mL, Mouth Rinse, PRN, Josiah Nigh, MD   Oral care mouth rinse, 15 mL,  Mouth Rinse, 4 times per day, Regalado, Belkys A, MD, 15 mL at 02/24/24 1331   pantoprazole  (PROTONIX ) EC tablet 40 mg, 40 mg, Oral, Daily, Alva, Rakesh V, MD, 40 mg at 02/24/24 5621   polyethylene glycol (MIRALAX  / GLYCOLAX ) packet 17 g, 17 g, Oral, Daily PRN, Smith, Joshua C, NP   sodium chloride  flush (NS) 0.9 % injection 3 mL, 3 mL, Intravenous, Once, Scheving, Dozier Genre, MD  Patients Current Diet:  Diet Order             DIET DYS 3 Room service appropriate? Yes with Assist; Fluid consistency: Nectar Thick  Diet effective now                   Precautions / Restrictions Precautions Precautions: Fall Precaution/Restrictions Comments: permissive HTN, high fall risk (fell out of chair 6/11) Restrictions Weight Bearing Restrictions Per Provider Order: No   Has the patient had 2 or more falls or a fall with injury in the past year? 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 Independent  Indoor Mobility: Did the patient need assistance with walking from room to room (with or without device)? Independent  Stairs: Did the patient need assistance with internal or external stairs (with or without device)? Independent  Functional Cognition: Did the patient need help planning regular tasks such as shopping or remembering to take medications? Independent  Patient Information Are you of Hispanic, Latino/a,or Spanish origin?: A. No, not of Hispanic, Latino/a, or Spanish origin What is your race?: B. Black or African American Do you need or want an interpreter to communicate with a doctor or health care staff?: 0. No  Patient's Response To:  Health Literacy and Transportation Is the patient able to respond to health literacy and transportation needs?: Yes Health Literacy - How often do you  need to have someone help you when you read instructions, pamphlets, or other written material from your doctor or pharmacy?: Never In the past 12 months, has lack of transportation kept you from medical appointments or from getting medications?: No In the past 12 months, has lack of transportation kept you from meetings, work, or from getting things needed for daily living?: No  Journalist, newspaper / Equipment  Home Equipment: None  Prior Device Use: Indicate devices/aids used by the patient prior to current illness, exacerbation or injury? None of the above  Current Functional Level Cognition  Arousal/Alertness: Awake/alert Overall Cognitive Status: Impaired/Different from baseline Orientation Level: Oriented to person, Oriented to place, Oriented to situation, Disoriented to time Attention: Sustained Sustained Attention: Appears intact Memory: Impaired Memory Impairment: Storage deficit, Retrieval deficit Awareness: Impaired Awareness Impairment: Intellectual impairment Problem Solving: Impaired Problem Solving Impairment: Verbal basic, Functional basic Executive Function: Reasoning Reasoning: Impaired Reasoning Impairment: Verbal basic    Extremity Assessment (includes Sensation/Coordination)  Upper Extremity Assessment: Generalized weakness LUE Deficits / Details: Pt with significant LUE weakness, needing increased attention to LUE to activate at all; facilitating composite flexion with sweep tap and pt initiating on R and then L with significantly increased time. pt with no active shoulder shrug. pt In gravity eliminated position able to perofrm shoulder adduction, with trace activation elbow flexion. LUE Sensation: decreased light touch LUE Coordination: decreased fine motor, decreased gross motor  Lower Extremity Assessment: Defer to PT evaluation RLE Deficits / Details: grossly 5/5 to MMT, coordiantion vs motor planning deficits RLE Coordination: decreased gross  motor LLE Deficits / Details: no activation to command, 3-5 beats clonus in ankle, increased tone L HS. LLE Sensation: decreased light touch, decreased proprioception LLE Coordination: decreased fine motor, decreased gross motor    ADLs  Overall ADL's : Needs assistance/impaired Eating/Feeding: Set up, Bed level Grooming: Moderate assistance, Sitting Upper Body Bathing: Moderate assistance, Maximal assistance, Sitting Lower Body Bathing: Maximal assistance, +2 for physical assistance, +2 for safety/equipment Upper Body Dressing : Moderate assistance, Sitting Upper Body Dressing Details (indicate cue type and reason): to change gown Lower Body Dressing: Maximal assistance, Sit to/from stand Toilet Transfer: Moderate assistance, +2 for physical assistance, +2 for safety/equipment Toilet Transfer Details (indicate cue type and reason): for STS ; total A for squat pivot    Mobility  Overal bed mobility: Needs Assistance Bed Mobility: Supine to Sit, Sit to Supine Rolling: Min assist Supine to sit: Min assist Sit to supine: Mod assist General bed mobility comments: performed rolling to straighten bed pads. requiring assistance with trunk to get to EOB and assistance with LLE to return to supine    Transfers  Overall transfer level: Needs assistance Equipment used: None Transfers: Sit to/from Stand Sit to Stand: Mod assist Bed to/from chair/wheelchair/BSC transfer type:: Via Lift equipment Squat pivot transfers: Total assist, +2 physical assistance, +2 safety/equipment Transfer via Lift Equipment: Stedy General transfer comment: stood with face to face technique to move towards [facility] with mod assist    Ambulation / Gait / Stairs / Wheelchair Mobility  Ambulation/Gait General Gait Details: unable to safely progress to ambulating yet, poor standing balance    Posture / Balance Dynamic Sitting Balance Sitting balance - Comments: posterior and left lateral leaning on EOB with min to CGA  for balance Balance Overall balance assessment: Needs assistance Sitting-balance support: Single extremity supported, Feet supported Sitting balance-Leahy Scale: Poor Sitting balance - Comments: posterior and left lateral leaning on EOB with min to CGA for balance Postural control: Left lateral lean, Posterior lean Standing balance support: Bilateral upper extremity supported, During functional activity Standing balance-Leahy Scale: Poor Standing balance comment: reliant on external support    Special needs/care consideration N/a   Previous Home Environment (from acute therapy documentation) Living Arrangements: Non-relatives/Friends, Other relatives (brother)  Lives With: Other (Comment) (BIL and unable to identify second person) Type of Home: House Home Layout: One  level Home Access: Stairs to enter Entrance Stairs-Rails: None Entrance Stairs-Number of Steps: 3 Bathroom Shower/Tub: Engineer, manufacturing systems: Standard Home Care Services: No  Discharge Living Setting Plans for Discharge Living Setting: Patient's home, Lives with (comment) (brother and sister) Type of Home at Discharge: House Discharge Home Layout: One level Discharge Home Access: Stairs to enter Entrance Stairs-Rails: None Entrance Stairs-Number of Steps: 3 Discharge Bathroom Shower/Tub: Tub/shower unit Discharge Bathroom Toilet: Standard Discharge Bathroom Accessibility: Yes How Accessible: Accessible via walker Does the patient have any problems obtaining your medications?:  (medicaid pending)  Social/Family/Support Systems Anticipated Caregiver: brother, Royston Cornea Anticipated Industrial/product designer Information: [phone] Ability/Limitations of Caregiver: Royston Cornea and Landa Pine work third shift but can have someone stay with Geoffery over night Caregiver Availability: 24/7 Discharge Plan Discussed with Primary Caregiver: Yes Is Caregiver In Agreement with Plan?: Yes Does Caregiver/Family have Issues with  Lodging/Transportation while Pt is in Rehab?: No  Goals Patient/Family Goal for Rehab: PT/OT/SLP supervision Expected length of stay: 10-14 days  Decrease burden of Care through IP rehab admission: N/A  Possible need for SNF placement upon discharge:  Not anticipated.  Plan for discharge home with brother and sister Royston Cornea and [place]) providing 24/7 supervision.    Patient Condition: I have reviewed medical records from [facility], spoken with [facility] team, and patient and family member. I met with patient at the bedside for inpatient rehabilitation assessment.  Patient will benefit from ongoing PT, OT, and SLP, can actively participate in 3 hours of therapy a day 5 days of the week, and can make measurable gains during the admission.  Patient will also benefit from the coordinated team approach during an Inpatient Acute Rehabilitation admission.  The patient will receive intensive therapy as well as Rehabilitation physician, nursing, social worker, and care management interventions.  Due to safety, skin/wound care, disease management, medication administration, pain management, and patient education the patient requires 24 hour a day rehabilitation nursing.  The patient is currently mod assist with mobility and basic ADLs.  Discharge setting and therapy post discharge at home is anticipated.  Patient has agreed to participate in the Acute Inpatient Rehabilitation Program and will admit ***.  Preadmission Screen Completed By:  Mickey Alar, PT, DPT 02/24/2024 3:40 PM ______________________________________________________________________   Discussed status with Dr. [name] on *** at *** and received approval for admission today.  Admission Coordinator:  Caitlin E Warren, PT, DPT time Aaron AasAlanna Hu ***   Assessment/Plan: Diagnosis: *** Does the need for close, 24 hr/day Medical supervision in concert with the patient's rehab needs make it unreasonable for this patient to be served in a less intensive setting?  {yes_no_potentially:3041433} Co-Morbidities requiring supervision/potential complications: *** Due to {due MV:7846962}, does the patient require 24 hr/day rehab nursing? {yes_no_potentially:3041433} Does the patient require coordinated care of a physician, rehab nurse, PT, OT, and SLP to address physical and functional deficits in the context of the above medical diagnosis(es)? {yes_no_potentially:3041433} Addressing deficits in the following areas: {deficits:3041436} Can the patient actively participate in an intensive therapy program of at least 3 hrs of therapy 5 days a week? {yes_no_potentially:3041433} The potential for patient to make measurable gains while on inpatient rehab is {potential:3041437} Anticipated functional outcomes upon discharge from inpatient rehab: {functional outcomes:304600100} PT, {functional outcomes:304600100} OT, {functional outcomes:304600100} SLP Estimated rehab length of stay to reach the above functional goals is: *** Anticipated discharge destination: {anticipated dc setting:21604} 10. Overall Rehab/Functional Prognosis: {potential:3041437}   MD Signature: ***

## 2024-02-24 NOTE — Progress Notes (Signed)
 Physical Therapy Treatment Patient Details Name: Juan Townsend MRN: [id number] DOB: 1976/05/21 Today's Date: 02/24/2024   History of Present Illness The pt is a 48 yo male presenting 6/10 with left sided weakness, falls, and L eye vision changes. 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 Fall out of chair on 6/11. PMH includes: CVA without residual deficits, HIV, hypercholesteremia, and HTN.    PT Comments  Patient progressing to ambulation using wall rail on R for R lateral weight shift and pt eventually able to move L foot on his own.  Still with heavy hanging arm and not able to elicit distal movement though improved shoulder position with cues for bilateral scapular squeeze.  Feel he remains appropriate for inpatient rehab prior to d/c home.     If plan is discharge home, recommend the following: Two people to help with walking and/or transfers;Assistance with cooking/housework;Assist for transportation;Help with stairs or ramp for entrance;Supervision due to cognitive status   Can travel by private vehicle        Equipment Recommendations  Wheelchair (measurements PT);Wheelchair cushion (measurements PT);BSC/3in1    Recommendations for Other Services       Precautions / Restrictions Precautions Precautions: Fall Recall of Precautions/Restrictions: Impaired     Mobility  Bed Mobility Overal bed mobility: Needs Assistance Bed Mobility: Supine to Sit, Sit to Supine     Supine to sit: Min assist, Used rails, HOB elevated Sit to supine: Mod assist   General bed mobility comments: assist for stabilizing trunk, pt pulling on rail with R hand, to supine assist for L leg onto bed and for positioning    Transfers Overall transfer level: Needs assistance Equipment used: None Transfers: Sit to/from Stand, Bed to chair/wheelchair/BSC Sit to Stand: Mod assist Stand pivot transfers: Mod assist          General transfer comment: A to pivot L with cues for positioning and A for placement L foot; back to R to get back to bed using bed rail with A for L leg blocking    Ambulation/Gait Ambulation/Gait assistance: Mod assist, +2 safety/equipment Gait Distance (Feet): 10 Feet (x 3) Assistive device:  (wall rail in hallway) Gait Pattern/deviations: Step-to pattern, Decreased stride length, Decreased dorsiflexion - left, Knee flexed in stance - left, Shuffle       General Gait Details: R hand on wall rail in hallway pt with A initially for L foot progression and cues and A for L hip & knee extension in stance; able to progress L foot eventually still with A for hip and knee extension in stance and for lateral weight shifts   Stairs             Wheelchair Mobility     Tilt Bed    Modified Rankin (Stroke Patients Only) Modified Rankin (Stroke Patients Only) Pre-Morbid Rankin Score: No symptoms Modified Rankin: Moderately severe disability     Balance Overall balance assessment: Needs assistance   Sitting balance-Leahy Scale: Fair Sitting balance - Comments: on EOB with S for balance   Standing balance support: Single extremity supported Standing balance-Leahy Scale: Poor Standing balance comment: reliant on external support                            Communication Communication Communication: Impaired Factors Affecting Communication: Difficulty expressing self  Cognition Arousal: Alert Behavior During Therapy: Flat affect  PT - Cognitive impairments: Attention, Safety/Judgement                       PT - Cognition Comments: slow processing, does respond verbally when give specific question, but limited verbalizations during session, decreased L side awareness Following commands: Impaired Following commands impaired: Follows one step commands with increased time, Follows multi-step commands inconsistently    Cueing Cueing Techniques: Verbal  cues, Gestural cues, Tactile cues, Visual cues  Exercises      General Comments General comments (skin integrity, edema, etc.): VSS throughout, used urinal evidently after condom cath came off; RN Aware      Pertinent Vitals/Pain Pain Assessment Pain Assessment: No/denies pain    Home Living                          Prior Function            PT Goals (current goals can now be found in the care plan section) Progress towards PT goals: Progressing toward goals    Frequency    Min 3X/week      PT Plan      Co-evaluation              AM-PAC PT 6 Clicks Mobility   Outcome Measure  Help needed turning from your back to your side while in a flat bed without using bedrails?: A Little Help needed moving from lying on your back to sitting on the side of a flat bed without using bedrails?: A Little Help needed moving to and from a bed to a chair (including a wheelchair)?: A Lot Help needed standing up from a chair using your arms (e.g., wheelchair or bedside chair)?: A Lot Help needed to walk in hospital room?: Total Help needed climbing 3-5 steps with a railing? : Total 6 Click Score: 12    End of Session Equipment Utilized During Treatment: Gait belt Activity Tolerance: Patient tolerated treatment well Patient left: in bed;with bed alarm set;with call bell/phone within reach   PT Visit Diagnosis: Other abnormalities of gait and mobility (R26.89);Muscle weakness (generalized) (M62.81);Hemiplegia and hemiparesis;Other symptoms and signs involving the nervous system (R29.898) Hemiplegia - Right/Left: Left Hemiplegia - dominant/non-dominant: Non-dominant Hemiplegia - caused by: Cerebral infarction     Time: 1131-1202 PT Time Calculation (min) (ACUTE ONLY): 31 min  Charges:    $Gait Training: 8-22 mins $Therapeutic Activity: 8-22 mins PT General Charges $$ ACUTE PT VISIT: 1 Visit                     Abigail Hoff, PT Acute Rehabilitation  Services Office:[phone] 02/24/2024    Juan Townsend 02/24/2024, 4:30 PM
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
## 2024-02-24 NOTE — Progress Notes (Signed)
 Inpatient Rehab Admissions Coordinator:   Met with pt and his brother Royston Cornea, and sister Landa Pine, at bedside.  Explained CIR goals/expectations of rehab stay and reviewed expectation of 24/7 supervision at discharge.  We reviewed medicaid pending and cost of rehab.  All are in agreement to proceed.  I will plan for admit tomorrow pending bed availability.    Loye Rumble, PT, DPT Admissions Coordinator [phone] 02/24/24  3:34 PM

## 2024-02-24 NOTE — Progress Notes (Signed)
 Speech Language Pathology Treatment: Dysphagia;Cognitive-Linguistic  Patient Details Name: Juan Townsend MRN: [id number] DOB: [birth year] Today's Date: 02/24/2024 Time: 1050-1107 SLP Time Calculation (min) (ACUTE ONLY): 17 min  Assessment / Plan / Recommendation Clinical Impression  Pt continues to present with dysprosody and impaired initiation. Pt is oriented x3 and recalls working with SLP last week. He needed multiple prompts to respond to questions and to identify his wants/needs. 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   Reviewed MBS images with pt who verbalized understanding of findings. He followed commands to turn his head to the L and sustain this posture during consecutive sips of honey thick liquids. No signs of aspiration were observed during trials, although aspiration on MBS 6/13 was silent in nature. He cleared his throat on command today. He could verbalize recommendations at the end of the session. Recommend continuing Dys 3 solids but upgrading to nectar thick liquids with full supervision to ensure he uses a L head turn with all liquids. Continue to recommend consideration of ENT f/u. Will follow for goals related to dysphagia and cognition.    HPI HPI: Juan Townsend is a 48 yo male presenting to ED 6/10 with L sided weakness, falls, and L eye vision changes. 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 Pt passed the [place]ale swallow screen 6/10 but RN noted increased coughing with liquids and SLP was consulted. FEES 6/12 inconclusive in regards to swallowing function and safety but showed bilateral arytenoid movement, but limited and incongruent in addition to minimal glottic space at rest,  L aryepiglottic fold erythema, and epiglottic excresence on the R side. PMH includes CVA without residual deficits, HIV, hypercholesteremia, HTN      SLP Plan  Continue with current plan of care          Recommendations  Diet recommendations: Dysphagia 3 (mechanical soft);Nectar-thick liquid Liquids provided via: Cup;Straw Medication Administration: Crushed with puree Supervision: Full supervision/cueing for compensatory strategies;Staff to assist with self feeding Compensations: Slow rate;Small sips/bites;Hard cough after swallow Postural Changes and/or Swallow Maneuvers: Head turn left during swallow;Seated upright 90 degrees                  Oral care before and after PO;Oral care BID   Frequent or constant Supervision/Assistance Dysphagia, oropharyngeal phase (R13.12);Cognitive communication deficit (Y78.295)     Continue with current plan of care     Juan Townsend, M.A., CCC-SLP Speech Language Pathology, Acute Rehabilitation Services  Secure Chat preferred [phone]   02/24/2024, 11:34 AM

## 2024-02-24 NOTE — TOC Progression Note (Signed)
 Transition of Care [facility]) - Progression Note    Patient Details  Name: Juan Townsend MRN: [id number] Date of Birth: 10-22-1975  [facility] CM/SW Contact  Jonathan Neighbor, RN Phone Number: 02/24/2024, 3:26 PM  Clinical Narrative:     Plan for CIR when bed available. TOC following.  Expected Discharge Plan: IP Rehab Facility Barriers to Discharge: Continued Medical Work up, Inadequate or no insurance  Expected Discharge Plan and Services       Living arrangements for the past 2 months: Single Family Home                                       Social Determinants of Health (SDOH) Interventions SDOH Screenings   Food Insecurity: Patient Unable To Answer (02/18/2024)  Depression (PHQ2-9): Low Risk  (10/17/2023)  Tobacco Use: High Risk (02/19/2024)    Readmission Risk Interventions     No data to display

## 2024-02-25 ENCOUNTER — Encounter (HOSPITAL_COMMUNITY): Payer: Self-pay | Admitting: Physical Medicine & Rehabilitation

## 2024-02-25 ENCOUNTER — Other Ambulatory Visit: Payer: Self-pay

## 2024-02-25 ENCOUNTER — Encounter (HOSPITAL_COMMUNITY): Payer: Self-pay | Admitting: Internal Medicine

## 2024-02-25 ENCOUNTER — Inpatient Hospital Stay (HOSPITAL_COMMUNITY)
Admission: AD | Admit: 2024-02-25 | Discharge: 2024-03-18 | DRG: 057 | Disposition: A | Discharge status: Home / Self Care | Payer: Self-pay | Source: Intra-hospital | Attending: Physical Medicine & Rehabilitation | Admitting: Physical Medicine & Rehabilitation

## 2024-02-25 DIAGNOSIS — R1310 Dysphagia, unspecified: Secondary | ICD-10-CM | POA: Diagnosis present

## 2024-02-25 DIAGNOSIS — R4701 Aphasia: Secondary | ICD-10-CM | POA: Diagnosis present

## 2024-02-25 DIAGNOSIS — E871 Hypo-osmolality and hyponatremia: Secondary | ICD-10-CM | POA: Diagnosis present

## 2024-02-25 DIAGNOSIS — N189 Chronic kidney disease, unspecified: Secondary | ICD-10-CM | POA: Insufficient documentation

## 2024-02-25 DIAGNOSIS — E7800 Pure hypercholesterolemia, unspecified: Secondary | ICD-10-CM | POA: Diagnosis present

## 2024-02-25 DIAGNOSIS — N1831 Chronic kidney disease, stage 3a: Secondary | ICD-10-CM | POA: Diagnosis present

## 2024-02-25 DIAGNOSIS — B20 Human immunodeficiency virus [HIV] disease: Secondary | ICD-10-CM | POA: Diagnosis present

## 2024-02-25 DIAGNOSIS — I69398 Other sequelae of cerebral infarction: Principal | ICD-10-CM

## 2024-02-25 DIAGNOSIS — I69354 Hemiplegia and hemiparesis following cerebral infarction affecting left non-dominant side: Secondary | ICD-10-CM

## 2024-02-25 DIAGNOSIS — I161 Hypertensive emergency: Secondary | ICD-10-CM | POA: Diagnosis present

## 2024-02-25 DIAGNOSIS — D631 Anemia in chronic kidney disease: Secondary | ICD-10-CM | POA: Diagnosis present

## 2024-02-25 DIAGNOSIS — I10 Essential (primary) hypertension: Secondary | ICD-10-CM | POA: Insufficient documentation

## 2024-02-25 DIAGNOSIS — R29705 NIHSS score 5: Secondary | ICD-10-CM | POA: Diagnosis present

## 2024-02-25 DIAGNOSIS — G8114 Spastic hemiplegia affecting left nondominant side: Secondary | ICD-10-CM | POA: Diagnosis present

## 2024-02-25 DIAGNOSIS — I69320 Aphasia following cerebral infarction: Secondary | ICD-10-CM

## 2024-02-25 DIAGNOSIS — R4189 Other symptoms and signs involving cognitive functions and awareness: Secondary | ICD-10-CM

## 2024-02-25 DIAGNOSIS — I63521 Cerebral infarction due to unspecified occlusion or stenosis of right anterior cerebral artery: Principal | ICD-10-CM | POA: Diagnosis present

## 2024-02-25 DIAGNOSIS — I129 Hypertensive chronic kidney disease with stage 1 through stage 4 chronic kidney disease, or unspecified chronic kidney disease: Secondary | ICD-10-CM | POA: Diagnosis present

## 2024-02-25 DIAGNOSIS — Z7982 Long term (current) use of aspirin: Secondary | ICD-10-CM

## 2024-02-25 DIAGNOSIS — F17210 Nicotine dependence, cigarettes, uncomplicated: Secondary | ICD-10-CM | POA: Diagnosis present

## 2024-02-25 DIAGNOSIS — I69391 Dysphagia following cerebral infarction: Secondary | ICD-10-CM

## 2024-02-25 DIAGNOSIS — Z7409 Other reduced mobility: Secondary | ICD-10-CM | POA: Diagnosis present

## 2024-02-25 DIAGNOSIS — Z8673 Personal history of transient ischemic attack (TIA), and cerebral infarction without residual deficits: Secondary | ICD-10-CM

## 2024-02-25 DIAGNOSIS — R32 Unspecified urinary incontinence: Secondary | ICD-10-CM | POA: Diagnosis present

## 2024-02-25 DIAGNOSIS — D649 Anemia, unspecified: Secondary | ICD-10-CM | POA: Insufficient documentation

## 2024-02-25 DIAGNOSIS — D696 Thrombocytopenia, unspecified: Secondary | ICD-10-CM | POA: Diagnosis present

## 2024-02-25 DIAGNOSIS — N179 Acute kidney failure, unspecified: Secondary | ICD-10-CM | POA: Diagnosis present

## 2024-02-25 DIAGNOSIS — Z79899 Other long term (current) drug therapy: Secondary | ICD-10-CM

## 2024-02-25 DIAGNOSIS — D638 Anemia in other chronic diseases classified elsewhere: Secondary | ICD-10-CM | POA: Diagnosis present

## 2024-02-25 LAB — BASIC METABOLIC PANEL WITH GFR
Anion gap: 7 (ref 5–15)
BUN: 24 mg/dL — ABNORMAL HIGH (ref 6–20)
CO2: 24 mmol/L (ref 22–32)
Calcium: 8.5 mg/dL — ABNORMAL LOW (ref 8.9–10.3)
Chloride: 101 mmol/L (ref 98–111)
Creatinine, Ser: 2.76 mg/dL — ABNORMAL HIGH (ref 0.61–1.24)
GFR, Estimated: 28 mL/min — ABNORMAL LOW (ref >60)
Glucose, Bld: 105 mg/dL — ABNORMAL HIGH (ref 70–99)
Potassium: 3.6 mmol/L (ref 3.5–5.1)
Sodium: 132 mmol/L — ABNORMAL LOW (ref 135–145)

## 2024-02-25 LAB — GLUCOSE, CAPILLARY
Glucose-Capillary: 104 mg/dL — ABNORMAL HIGH (ref 70–99)
Glucose-Capillary: 106 mg/dL — ABNORMAL HIGH (ref 70–99)
Glucose-Capillary: 108 mg/dL — ABNORMAL HIGH (ref 70–99)
Glucose-Capillary: 116 mg/dL — ABNORMAL HIGH (ref 70–99)

## 2024-02-25 LAB — CBC
HCT: 27.9 % — ABNORMAL LOW (ref 39.0–52.0)
Hemoglobin: 9.5 g/dL — ABNORMAL LOW (ref 13.0–17.0)
MCH: 30.4 pg (ref 26.0–34.0)
MCHC: 34.1 g/dL (ref 30.0–36.0)
MCV: 89.4 fL (ref 80.0–100.0)
Platelets: 147 10*3/uL — ABNORMAL LOW (ref 150–400)
RBC: 3.12 MIL/uL — ABNORMAL LOW (ref 4.22–5.81)
RDW: 13.2 % (ref 11.5–15.5)
WBC: 5 10*3/uL (ref 4.0–10.5)
nRBC: 0 % (ref 0.0–0.2)

## 2024-02-25 MED ORDER — HYDRALAZINE HCL 50 MG PO TABS
100.0000 mg | ORAL_TABLET | Freq: Three times a day (TID) | ORAL | Status: DC
  Administered 2024-02-25 – 2024-03-18 (×65): 100 mg via ORAL
  Filled 2024-02-25 (×65): qty 2

## 2024-02-25 MED ORDER — GUAIFENESIN-DM 100-10 MG/5ML PO SYRP: 5.0000 mL | ORAL_SOLUTION | Freq: Four times a day (QID) | ORAL | Status: DC | PRN

## 2024-02-25 MED ORDER — ENOXAPARIN SODIUM 30 MG/0.3ML IJ SOSY
30.0000 mg | PREFILLED_SYRINGE | INTRAMUSCULAR | Status: DC
  Administered 2024-02-25 – 2024-03-03 (×8): 30 mg via SUBCUTANEOUS
  Filled 2024-02-25 (×8): qty 0.3

## 2024-02-25 MED ORDER — PROCHLORPERAZINE MALEATE 5 MG PO TABS: 5.0000 mg | ORAL_TABLET | Freq: Four times a day (QID) | ORAL | Status: DC | PRN

## 2024-02-25 MED ORDER — CLOPIDOGREL BISULFATE 75 MG PO TABS
75.0000 mg | ORAL_TABLET | Freq: Every day | ORAL | Status: AC
  Administered 2024-02-26 – 2024-03-10 (×14): 75 mg via ORAL
  Filled 2024-02-25 (×14): qty 1

## 2024-02-25 MED ORDER — METOPROLOL TARTRATE 50 MG PO TABS: 50.0000 mg | ORAL_TABLET | Freq: Two times a day (BID) | ORAL | 0 refills | Status: DC

## 2024-02-25 MED ORDER — BICTEGRAVIR-EMTRICITAB-TENOFOV 50-200-25 MG PO TABS
1.0000 | ORAL_TABLET | Freq: Every day | ORAL | Status: DC
  Administered 2024-02-26 – 2024-03-18 (×22): 1 via ORAL
  Filled 2024-02-25 (×22): qty 1

## 2024-02-25 MED ORDER — METOPROLOL TARTRATE 50 MG PO TABS
50.0000 mg | ORAL_TABLET | Freq: Two times a day (BID) | ORAL | Status: DC
  Administered 2024-02-25 – 2024-03-01 (×10): 50 mg via ORAL
  Filled 2024-02-25 (×10): qty 1

## 2024-02-25 MED ORDER — TRAZODONE HCL 50 MG PO TABS: 25.0000 mg | ORAL_TABLET | Freq: Every evening | ORAL | Status: DC | PRN

## 2024-02-25 MED ORDER — DIPHENHYDRAMINE HCL 25 MG PO CAPS
25.0000 mg | ORAL_CAPSULE | Freq: Four times a day (QID) | ORAL | Status: DC | PRN
  Administered 2024-03-03 – 2024-03-05 (×2): 25 mg via ORAL
  Filled 2024-02-25 (×2): qty 1

## 2024-02-25 MED ORDER — ACETAMINOPHEN 325 MG PO TABS
325.0000 mg | ORAL_TABLET | ORAL | Status: DC | PRN
  Administered 2024-02-27 – 2024-03-03 (×3): 650 mg via ORAL
  Filled 2024-02-25 (×3): qty 2

## 2024-02-25 MED ORDER — DOCUSATE SODIUM 100 MG PO CAPS
100.0000 mg | ORAL_CAPSULE | Freq: Two times a day (BID) | ORAL | Status: DC | PRN
  Administered 2024-02-28: 100 mg via ORAL
  Filled 2024-02-25 (×3): qty 1

## 2024-02-25 MED ORDER — CLOPIDOGREL BISULFATE 75 MG PO TABS: 75.0000 mg | ORAL_TABLET | Freq: Every day | ORAL | 0 refills | Status: DC

## 2024-02-25 MED ORDER — PANTOPRAZOLE SODIUM 40 MG PO TBEC: 40.0000 mg | DELAYED_RELEASE_TABLET | Freq: Every day | ORAL | 0 refills | Status: DC

## 2024-02-25 MED ORDER — FLEET ENEMA RE ENEM
1.0000 | ENEMA | Freq: Once | RECTAL | Status: DC | PRN
Start: 2024-02-25 — End: 2024-03-18

## 2024-02-25 MED ORDER — AMLODIPINE BESYLATE 10 MG PO TABS
10.0000 mg | ORAL_TABLET | Freq: Every day | ORAL | Status: DC
  Administered 2024-02-26 – 2024-03-18 (×22): 10 mg via ORAL
  Filled 2024-02-25 (×22): qty 1

## 2024-02-25 MED ORDER — AMLODIPINE BESYLATE 10 MG PO TABS: 10.0000 mg | ORAL_TABLET | Freq: Every day | ORAL | 3 refills | Status: DC

## 2024-02-25 MED ORDER — NICOTINE 21 MG/24HR TD PT24
21.0000 mg | MEDICATED_PATCH | Freq: Every day | TRANSDERMAL | Status: DC
  Administered 2024-02-26 – 2024-03-18 (×22): 21 mg via TRANSDERMAL
  Filled 2024-02-25 (×22): qty 1

## 2024-02-25 MED ORDER — ATORVASTATIN CALCIUM 20 MG PO TABS: 20.0000 mg | ORAL_TABLET | Freq: Every day | ORAL | 2 refills | Status: DC

## 2024-02-25 MED ORDER — PANTOPRAZOLE SODIUM 40 MG PO TBEC
40.0000 mg | DELAYED_RELEASE_TABLET | Freq: Every day | ORAL | Status: DC
  Administered 2024-02-26 – 2024-03-18 (×22): 40 mg via ORAL
  Filled 2024-02-25 (×22): qty 1

## 2024-02-25 MED ORDER — ORAL CARE MOUTH RINSE
15.0000 mL | OROMUCOSAL | Status: DC
  Administered 2024-02-26 – 2024-03-18 (×53): 15 mL via OROMUCOSAL

## 2024-02-25 MED ORDER — CALCIUM CARBONATE 1250 (500 CA) MG PO TABS
1.0000 | ORAL_TABLET | Freq: Every day | ORAL | Status: DC
  Administered 2024-02-26 – 2024-03-18 (×22): 1250 mg via ORAL
  Filled 2024-02-25 (×22): qty 1

## 2024-02-25 MED ORDER — BISACODYL 10 MG RE SUPP: 10.0000 mg | Freq: Every day | RECTAL | Status: DC | PRN

## 2024-02-25 MED ORDER — PROCHLORPERAZINE EDISYLATE 10 MG/2ML IJ SOLN: 5.0000 mg | Freq: Four times a day (QID) | INTRAMUSCULAR | Status: DC | PRN

## 2024-02-25 MED ORDER — ASPIRIN 81 MG PO TBEC: 81.0000 mg | DELAYED_RELEASE_TABLET | Freq: Every day | ORAL | 12 refills | Status: DC

## 2024-02-25 MED ORDER — LIDOCAINE HCL URETHRAL/MUCOSAL 2 % EX GEL: CUTANEOUS | Status: DC | PRN

## 2024-02-25 MED ORDER — HYDRALAZINE HCL 100 MG PO TABS: 100.0000 mg | ORAL_TABLET | Freq: Three times a day (TID) | ORAL | 2 refills | Status: DC

## 2024-02-25 MED ORDER — NICOTINE 21 MG/24HR TD PT24: 21.0000 mg | MEDICATED_PATCH | Freq: Every day | TRANSDERMAL | 0 refills | Status: DC

## 2024-02-25 MED ORDER — ASPIRIN 81 MG PO TBEC
81.0000 mg | DELAYED_RELEASE_TABLET | Freq: Every day | ORAL | Status: DC
  Administered 2024-02-26 – 2024-03-18 (×22): 81 mg via ORAL
  Filled 2024-02-25 (×22): qty 1

## 2024-02-25 MED ORDER — ATORVASTATIN CALCIUM 10 MG PO TABS
20.0000 mg | ORAL_TABLET | Freq: Every day | ORAL | Status: DC
  Administered 2024-02-26 – 2024-03-17 (×20): 20 mg via ORAL
  Filled 2024-02-25 (×21): qty 2

## 2024-02-25 MED ORDER — POLYETHYLENE GLYCOL 3350 17 G PO PACK
17.0000 g | PACK | Freq: Every day | ORAL | Status: DC | PRN
  Administered 2024-02-28 – 2024-03-07 (×2): 17 g via ORAL
  Filled 2024-02-25 (×4): qty 1

## 2024-02-25 MED ORDER — ALUM & MAG HYDROXIDE-SIMETH 200-200-20 MG/5ML PO SUSP: 30.0000 mL | ORAL | Status: DC | PRN

## 2024-02-25 MED ORDER — PROCHLORPERAZINE 25 MG RE SUPP: 12.5000 mg | Freq: Four times a day (QID) | RECTAL | Status: DC | PRN

## 2024-02-25 NOTE — Progress Notes (Signed)
 Admit: 02/18/2024 LOS: 7  Juan Townsend is a 48 y.o. male. Admitted for CVA requiring ICU admission for Cleviprex  gtt for SBP >220.  Past medical history significant for uncontrolled hypertension, HIV, HLD, Hx of CVA.   Subjective:  A&O on exam. Denies pain. Working with PT    06/16 0701 - 06/17 0700 In: 180 [P.O.:180] Out: 200 [Urine:200]  Filed Weights   02/23/24 0409 02/24/24 0500 02/25/24 0500  Weight: 57 kg 59.9 kg 59.9 kg    Scheduled Meds:  amLODipine   10 mg Oral Daily   aspirin  EC  81 mg Oral Daily   atorvastatin   20 mg Oral Daily   bictegravir-emtricitabine -tenofovir  AF  1 tablet Oral Daily   clopidogrel   75 mg Oral Daily   enoxaparin  (LOVENOX ) injection  30 mg Subcutaneous Q24H   hydrALAZINE   100 mg Oral Q8H   metoprolol  tartrate  50 mg Oral BID   nicotine   21 mg Transdermal Daily   mouth rinse  15 mL Mouth Rinse 4 times per day   pantoprazole   40 mg Oral Daily   sodium chloride  flush  3 mL Intravenous Once   Continuous Infusions: PRN Meds:.acetaminophen , docusate sodium , hydrALAZINE , labetalol , mouth rinse, polyethylene glycol  Current Labs: reviewed    Physical Exam:  Blood pressure (!) 147/95, pulse 82, temperature 98.8 F (37.1 C), temperature source Oral, resp. rate 19, height 5' 6 (1.676 m), weight 59.9 kg, SpO2 98%. General: Chronically ill-appearing, no acute distress Cardio: RRR, no murmur on exam Pulm: clear, No increased work of breathing Abdomen: Soft, bowel sounds present, nontender Extremity: No peripheral edema   A/P AKI on CKD 3A: Cr uptrending but stable from prior.  Bladder scan from yesterday WNL. Continue trending BMP. Goal for Cr to stabilize/improve before starting new medications for BP control.  Thrombocytopenia: Improving.  Platelets now 147. Outpatient workup for TMA.  HTN: Blood pressure stable with SBP 150-170. Continue current medications as scheduled. Once Cr improving can add ARB.    Clem Currier, DO  02/25/2024, 8:49  AM  Recent Labs  Lab 02/18/24 1610 02/18/24 2335 02/19/24 0515 02/23/24 0723 02/24/24 0355 02/25/24 0440  NA 133*  --    < > 137 133* 132*  K 3.6  --    < > 3.6 3.8 3.6  CL 96*  --    < > 103 100 101  CO2 24  --    < > 21* 22 24  GLUCOSE 117*  --    < > 107* 104* 105*  BUN 45*  --    < > 24* 23* 24*  CREATININE 2.51*  --    < > 2.61* 2.70* 2.76*  CALCIUM  9.3  --    < > 9.2 8.7* 8.5*  PHOS 3.9 4.4  --   --   --   --    < > = values in this interval not displayed.   Recent Labs  Lab 02/18/24 1310 02/18/24 1314 02/20/24 0606 02/21/24 0756 02/25/24 0440  WBC 7.5   < > 5.4 4.4 5.0  NEUTROABS 5.7  --   --   --   --   HGB 12.3*   < > 11.0* 9.9* 9.5*  HCT 34.8*   < > 30.9* 28.3* 27.9*  MCV 85.7   < > 87.3 88.4 89.4  PLT 51*   < > 109* 122* 147*   < > = values in this interval not displayed.

## 2024-02-25 NOTE — TOC Transition Note (Signed)
 Transition of Care [facility]) - Discharge Note   Patient Details  Name: Juan Townsend MRN: [id number] Date of Birth: 1976-06-14  [facility]) CM/SW Contact:  Jonathan Neighbor, RN Phone Number: 02/25/2024, 9:44 AM   Clinical Narrative:     Pt is discharging to CIR today. TOC signing off.  Final next level of care: IP Rehab Facility Barriers to Discharge: Inadequate or no insurance, Barriers Unresolved (comment)   Patient Goals and CMS Choice            Discharge Placement                       Discharge Plan and Services Additional resources added to the After Visit Summary for                                       Social Drivers of Health (SDOH) Interventions SDOH Screenings   Food Insecurity: Patient Unable To Answer (02/18/2024)  Depression (PHQ2-9): Low Risk  (10/17/2023)  Tobacco Use: High Risk (02/19/2024)     Readmission Risk Interventions     No data to display

## 2024-02-25 NOTE — Progress Notes (Addendum)
 Inpatient Rehabilitation Admission Medication Review by a Pharmacist  A complete drug regimen review was completed for this patient to identify any potential clinically significant medication issues.  High Risk Drug Classes Is patient taking? Indication by Medication  Antipsychotic Yes PRN Prochlorperazine (PO, PR or IV) - nausea  Anticoagulant Yes Enoxaparin  - VTE prophylaxis  Antibiotic No   Opioid No   Antiplatelet Yes Asiprin 81 mg, Clopidogrel  - CVA prophylaxis - planned for 3 weeks then Aspirin  alone (begun 6/11)  Hypoglycemics/insulin No   Vasoactive Medication Yes Amlodipine , hydralazine , metoprolol  - hypertension  Chemotherapy No   Other Yes Biktarvy  - antiviral for HIV Atorvastatin  - hyperlipidemia Nicotine  patch - smoking cessation Pantoprazole  - GI prophylaxis  PRNs: Acetaminophen  - mild pain Maalox - indigestion Diphenhydramine - itching Guaifenesin-dextromethorphan - cough Lidocaine jelly - in and out cath Trazodone - sleep Bisacodyl, docusate, Miralax , Fleets enema - constipation     Type of Medication Issue Identified Description of Issue Recommendation(s)  Drug Interaction(s) (clinically significant)     Duplicate Therapy     Allergy     No Medication Administration End Date     Incorrect Dose     Additional Drug Therapy Needed     Significant med changes from prior encounter (inform family/care partners about these prior to discharge). Metoprolol  dose increased. Prior Aspirin  325 mg > reduced to 81 mg.  New: clopidogrel  x 21 days total, amlodipine , hydralazine , atrovastatin, pantoprazole , nicotine  patch. Communicate changes with patient/family prior to discharge.  Other       Clinically significant medication issues were identified that warrant physician communication and completion of prescribed/recommended actions by midnight of the next day:  No  Pharmacist comments:   - Clopidogrel  stop time is in place for 14 more days.  Begun 02/19/24  during inpatient stay.  Time spent performing this drug regimen review (minutes):  20   Juan Townsend, [place] 02/25/2024 4:11 PM
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
## 2024-02-25 NOTE — Progress Notes (Signed)
 Physical Therapy Treatment Patient Details Name: Juan Townsend MRN: [id number] DOB: 03/03/1976 Today's Date: 02/25/2024   History of Present Illness The pt is a 48 yo male presenting 6/10 with left sided weakness, falls, and L eye vision changes. 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 Fall out of chair on 6/11. PMH includes: CVA without residual deficits, HIV, hypercholesteremia, and HTN.    PT Comments  Pt alert and agreeable to participate. In supine, provided LLE stretching/PROM and worked on closed chain exercises prior to transitioning to edge of bed. Pt requiring two person mod-maxA +2 for transfers and gait training. Continued efforts at ambulation using wall rail on R with manual assist provided for LLE progression and knee block required. No L quad activation noted during stance phase. Pt is young and able to tolerate intensive therapy to promote neuro-recovery and neuro-plasticity. Plan for d/c to AIR today.      If plan is discharge home, recommend the following: Two people to help with walking and/or transfers;Assistance with cooking/housework;Assist for transportation;Help with stairs or ramp for entrance;Supervision due to cognitive status   Can travel by private vehicle        Equipment Recommendations  Wheelchair (measurements PT);Wheelchair cushion (measurements PT);BSC/3in1    Recommendations for Other Services       Precautions / Restrictions Precautions Precautions: Fall Recall of Precautions/Restrictions: Impaired Precaution/Restrictions Comments: permissive HTN, high fall risk (fell out of chair 6/11) Restrictions Weight Bearing Restrictions Per Provider Order: No     Mobility  Bed Mobility Overal bed mobility: Needs Assistance Bed Mobility: Rolling, Sidelying to Sit Rolling: Contact guard assist Sidelying to sit: Mod assist       General bed mobility comments: Rolling towards  L, assist for LLE, and trunk to elevate completely to upright position    Transfers Overall transfer level: Needs assistance Equipment used: 2 person hand held assist Transfers: Sit to/from Stand, Bed to chair/wheelchair/BSC Sit to Stand: Mod assist Stand pivot transfers: Mod assist, +2 safety/equipment         General transfer comment: Tactile facilitation given at L quad to power up to standing, L knee block provided. Pivoting to R to chair with modA + 2    Ambulation/Gait Ambulation/Gait assistance: Max assist, +2 safety/equipment Gait Distance (Feet): 10 Feet (10 ft x 3) Assistive device:  (R hall railing) Gait Pattern/deviations: Step-to pattern, Decreased stride length, Decreased dorsiflexion - left, Knee flexed in stance - left, Shuffle Gait velocity: decreased Gait velocity interpretation: <1.31 ft/sec, indicative of household ambulator   General Gait Details: Tactile and verbal cueing for upright and midline posture, neutral pelvic and shoulder alignment. Worked on weight shifting and totalA for progression of LLE forwards with knee block provided (PT on stool). Pt with no quad activation noted. Close chair follow.   Stairs             Wheelchair Mobility     Tilt Bed    Modified Rankin (Stroke Patients Only) Modified Rankin (Stroke Patients Only) Pre-Morbid Rankin Score: No symptoms Modified Rankin: Moderately severe disability     Balance Overall balance assessment: Needs assistance Sitting-balance support: Feet supported Sitting balance-Leahy Scale: Fair     Standing balance support: Single extremity supported Standing balance-Leahy Scale: Poor                              Communication Communication  Communication: Impaired  Cognition Arousal: Alert Behavior During Therapy: Flat affect   PT - Cognitive impairments: Attention, Safety/Judgement                       PT - Cognition Comments: Will give short verbal  responses when asked a specific question, but overall limited verbalizations during session. good command following for motor tasks Following commands: Intact      Cueing Cueing Techniques: Verbal cues  Exercises General Exercises - Lower Extremity Ankle Circles/Pumps: PROM, Left, Supine (stretch x 30 s) Heel Slides: PROM, Left, 5 reps, Supine Hip ABduction/ADduction: PROM, Left, 5 reps, Supine Other Exercises Other Exercises: Supine: LLE hamstring stretch x 1 minute Other Exercises: Bridging x 5 with LLE support    General Comments        Pertinent Vitals/Pain Pain Assessment Pain Assessment: No/denies pain    Home Living                          Prior Function            PT Goals (current goals can now be found in the care plan section) Acute Rehab PT Goals Patient Stated Goal: return to independence Potential to Achieve Goals: Good Progress towards PT goals: Progressing toward goals    Frequency    Min 3X/week      PT Plan      Co-evaluation              AM-PAC PT 6 Clicks Mobility   Outcome Measure  Help needed turning from your back to your side while in a flat bed without using bedrails?: A Little Help needed moving from lying on your back to sitting on the side of a flat bed without using bedrails?: A Lot Help needed moving to and from a bed to a chair (including a wheelchair)?: A Lot Help needed standing up from a chair using your arms (e.g., wheelchair or bedside chair)?: A Lot Help needed to walk in hospital room?: Total Help needed climbing 3-5 steps with a railing? : Total 6 Click Score: 11    End of Session Equipment Utilized During Treatment: Gait belt Activity Tolerance: Patient tolerated treatment well Patient left: in bed;with call bell/phone within reach;with bed alarm set Nurse Communication: Mobility status;Other (comment) (pt working on breakfast) PT Visit Diagnosis: Other abnormalities of gait and mobility  (R26.89);Muscle weakness (generalized) (M62.81);Hemiplegia and hemiparesis;Other symptoms and signs involving the nervous system (R29.898) Hemiplegia - Right/Left: Left Hemiplegia - dominant/non-dominant: Non-dominant Hemiplegia - caused by: Cerebral infarction     Time: 8657-8469 PT Time Calculation (min) (ACUTE ONLY): 39 min  Charges:    $Gait Training: 23-37 mins $Therapeutic Activity: 8-22 mins PT General Charges $$ ACUTE PT VISIT: 1 Visit                     Verdia Glad, PT, DPT Acute Rehabilitation Services Office [phone]    Claria Crofts 02/25/2024, 10:43 AM

## 2024-02-25 NOTE — Progress Notes (Signed)
 Rawland Caddy, MD  Physician Physical Medicine and Rehabilitation   PMR Pre-admission    Signed   Date of Service: 02/25/2024  9:07 AM  Related encounter: ED to Hosp-Admission (Discharged) from 02/18/2024 in [facility]   Signed     Expand All Collapse All  PMR Admission Coordinator Pre-Admission Assessment   Patient: Juan Townsend is an 48 y.o., male MRN: [id number] DOB: 03-12-1976 Height: 5' 6 (167.6 cm) Weight: 59.9 kg   Insurance Information HMO:     PPO:      PCP:      IPA:      80/20:     OTHER:  PRIMARY: Medicaid pending      Policy#:       Subscriber:  CM Name:       Phone#:      Fax#:  Pre-Cert#:       Employer:  Benefits:  Phone #:      Name:  Eff. Date:      Deduct:       Out of Pocket Max:       Life Max:  CIR:       SNF:  Outpatient:      Co-Pay:  Home Health:       Co-Pay:  DME:      Co-Pay:  Providers:  SECONDARY:       Policy#:      Phone#:    Artist:       Phone#:     The Data processing manager" for patients in Inpatient Rehabilitation Facilities with attached "Privacy Act Statement-Health Care Records" was provided and verbally reviewed with: Patient and Family   Emergency Contact Information Contact Information       Name Relation Home Work Mobile    Laprise,James Brother [phone]        ROMANO, STIGGER     [phone]         Other Contacts   None on File        Current Medical History  Patient Admitting Diagnosis: CVA    History of Present Illness: Pt is a 48 y/o male with 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  In ED BP 261/129, NIHSS 5, Na 129, creatinine 1.61.  CT head showed 6 mm infarct within the mid left frontal lobe white matter, chronic lacunar infarct in the left basal ganglia.  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   Repeat CTs stable.  EF 65-70% with severe LVH.  Neurology recommendations for aspirin  and plavix  x3 weeks then aspirin  alone.  Hospital course complicated by AKI, nephrology consulted and felt likely related to volume/BP management and will likely continue to improve over time.  Therapy evaluations completed and pt was recommended for CIR.    Complete NIHSS TOTAL: 10   Patient's medical record from [facility] has been reviewed by the rehabilitation admission coordinator and physician.   Past Medical History      Past Medical History:  Diagnosis Date   HIV (human immunodeficiency virus infection) (HCC)     Hypercholesteremia     Hypertension     Stroke [facility])            Has the patient had major surgery during 100 days prior to admission? No   Family History   family history  is not on file.   Current Medications  Current Medications    Current Facility-Administered Medications:    acetaminophen  (TYLENOL ) tablet 650 mg, 650 mg, Oral, Q4H PRN, Smith, Joshua C, NP, 650 mg at 02/23/24 1650   amLODipine  (NORVASC ) tablet 10 mg, 10 mg, Oral, Daily, Gleason, Laura R, PA-C, 10 mg at 02/24/24 1610   aspirin  EC tablet 81 mg, 81 mg, Oral, Daily, Consuelo Denmark, MD, 81 mg at 02/24/24 9604   atorvastatin  (LIPITOR) tablet 20 mg, 20 mg, Oral, Daily, Lehner, Erin C, NP, 20 mg at 02/24/24 5409   bictegravir-emtricitabine -tenofovir  AF (BIKTARVY ) 50-200-25 MG per tablet 1 tablet, 1 tablet, Oral, Daily, Josiah Nigh, MD, 1 tablet at 02/24/24 8119   clopidogrel  (PLAVIX ) tablet 75 mg, 75 mg, Oral, Daily, Pham, Minh Q, RPH-CPP, 75 mg at 02/24/24 1478   docusate sodium  (COLACE) capsule 100 mg, 100 mg, Oral, BID PRN, Smith, Joshua C, NP   enoxaparin  (LOVENOX ) injection 30 mg, 30 mg, Subcutaneous, Q24H, Regalado, Belkys A, MD, 30 mg at 02/23/24 2009   hydrALAZINE  (APRESOLINE ) injection 5 mg, 5 mg, Intravenous, Q2H PRN, Ogan, Okoronkwo U, MD, 5 mg at 02/22/24 0103   hydrALAZINE  (APRESOLINE ) tablet 100 mg, 100  mg, Oral, Q8H, Regalado, Belkys A, MD, 100 mg at 02/24/24 1400   labetalol  (NORMODYNE ) injection 10 mg, 10 mg, Intravenous, Q2H PRN, Lehner, Erin C, NP, 10 mg at 02/21/24 0235   metoprolol  tartrate (LOPRESSOR ) tablet 50 mg, 50 mg, Oral, BID, Alva, Rakesh V, MD, 50 mg at 02/24/24 2956   nicotine  (NICODERM CQ  - dosed in mg/24 hours) patch 21 mg, 21 mg, Transdermal, Daily, Consuelo Denmark, MD, 21 mg at 02/24/24 2130   Oral care mouth rinse, 15 mL, Mouth Rinse, PRN, Josiah Nigh, MD   Oral care mouth rinse, 15 mL, Mouth Rinse, 4 times per day, Regalado, Belkys A, MD, 15 mL at 02/24/24 1331   pantoprazole  (PROTONIX ) EC tablet 40 mg, 40 mg, Oral, Daily, Alva, Rakesh V, MD, 40 mg at 02/24/24 8657   polyethylene glycol (MIRALAX  / GLYCOLAX ) packet 17 g, 17 g, Oral, Daily PRN, Smith, Joshua C, NP   sodium chloride  flush (NS) 0.9 % injection 3 mL, 3 mL, Intravenous, Once, Scheving, Dozier Genre, MD     Patients Current Diet:  Diet Order                  DIET DYS 3 Room service appropriate? Yes with Assist; Fluid consistency: Nectar Thick  Diet effective now                         Precautions / Restrictions Precautions Precautions: Fall Precaution/Restrictions Comments: permissive HTN, high fall risk (fell out of chair 6/11) Restrictions Weight Bearing Restrictions Per Provider Order: No    Has the patient had 2 or more falls or a fall with injury in the past year? 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 Independent   Indoor Mobility: Did the patient need assistance with walking from room to room (with or without device)? Independent   Stairs: Did the patient need assistance with internal or external stairs (with or without device)? Independent   Functional Cognition: Did the patient need help  planning regular tasks such as shopping or remembering to take medications? Independent  Patient Information Are you of Hispanic, Latino/a,or Spanish origin?: A. No, not of Hispanic, Latino/a, or Spanish origin What is your race?: B. Black or African American Do you need or want an interpreter to communicate with a doctor or health care staff?: 0. No   Patient's Response To:  Health Literacy and Transportation Is the patient able to respond to health literacy and transportation needs?: Yes Health Literacy - How often do you need to have someone help you when you read instructions, pamphlets, or other written material from your doctor or pharmacy?: Never In the past 12 months, has lack of transportation kept you from medical appointments or from getting medications?: No In the past 12 months, has lack of transportation kept you from meetings, work, or from getting things needed for daily living?: No   Home Assistive Devices / Equipment Home Equipment: None   Prior Device Use: Indicate devices/aids used by the patient prior to current illness, exacerbation or injury? None of the above   Current Functional Level Cognition   Arousal/Alertness: Awake/alert Overall Cognitive Status: Impaired/Different from baseline Orientation Level: Oriented to person, Oriented to place, Oriented to situation, Disoriented to time Attention: Sustained Sustained Attention: Appears intact Memory: Impaired Memory Impairment: Storage deficit, Retrieval deficit Awareness: Impaired Awareness Impairment: Intellectual impairment Problem Solving: Impaired Problem Solving Impairment: Verbal basic, Functional basic Executive Function: Reasoning Reasoning: Impaired Reasoning Impairment: Verbal basic    Extremity Assessment (includes Sensation/Coordination)   Upper Extremity Assessment: Generalized weakness LUE Deficits / Details: Pt with significant LUE weakness, needing increased attention to LUE to activate  at all; facilitating composite flexion with sweep tap and pt initiating on R and then L with significantly increased time. pt with no active shoulder shrug. pt In gravity eliminated position able to perofrm shoulder adduction, with trace activation elbow flexion. LUE Sensation: decreased light touch LUE Coordination: decreased fine motor, decreased gross motor  Lower Extremity Assessment: Defer to PT evaluation RLE Deficits / Details: grossly 5/5 to MMT, coordiantion vs motor planning deficits RLE Coordination: decreased gross motor LLE Deficits / Details: no activation to command, 3-5 beats clonus in ankle, increased tone L HS. LLE Sensation: decreased light touch, decreased proprioception LLE Coordination: decreased fine motor, decreased gross motor     ADLs   Overall ADL's : Needs assistance/impaired Eating/Feeding: Set up, Bed level Grooming: Moderate assistance, Sitting Upper Body Bathing: Moderate assistance, Maximal assistance, Sitting Lower Body Bathing: Maximal assistance, +2 for physical assistance, +2 for safety/equipment Upper Body Dressing : Moderate assistance, Sitting Upper Body Dressing Details (indicate cue type and reason): to change gown Lower Body Dressing: Maximal assistance, Sit to/from stand Toilet Transfer: Moderate assistance, +2 for physical assistance, +2 for safety/equipment Toilet Transfer Details (indicate cue type and reason): for STS ; total A for squat pivot     Mobility   Overal bed mobility: Needs Assistance Bed Mobility: Supine to Sit, Sit to Supine Rolling: Min assist Supine to sit: Min assist Sit to supine: Mod assist General bed mobility comments: performed rolling to straighten bed pads. requiring assistance with trunk to get to EOB and assistance with LLE to return to supine     Transfers   Overall transfer level: Needs assistance Equipment used: None Transfers: Sit to/from Stand Sit to Stand: Mod assist Bed to/from chair/wheelchair/BSC  transfer type:: Via Lift equipment Squat pivot transfers: Total assist, +2 physical assistance, +2 safety/equipment Transfer via Lift Equipment: Stedy General transfer comment: stood with face to face technique to move towards [facility] with mod assist  Ambulation / Gait / Stairs / Wheelchair Mobility   Ambulation/Gait General Gait Details: unable to safely progress to ambulating yet, poor standing balance     Posture / Balance Dynamic Sitting Balance Sitting balance - Comments: posterior and left lateral leaning on EOB with min to CGA for balance Balance Overall balance assessment: Needs assistance Sitting-balance support: Single extremity supported, Feet supported Sitting balance-Leahy Scale: Poor Sitting balance - Comments: posterior and left lateral leaning on EOB with min to CGA for balance Postural control: Left lateral lean, Posterior lean Standing balance support: Bilateral upper extremity supported, During functional activity Standing balance-Leahy Scale: Poor Standing balance comment: reliant on external support     Special needs/care consideration N/a    Previous Home Environment (from acute therapy documentation) Living Arrangements: Non-relatives/Friends, Other relatives (brother)  Lives With: Other (Comment) (BIL and unable to identify second person) Type of Home: House Home Layout: One level Home Access: Stairs to enter Entrance Stairs-Rails: None Entrance Stairs-Number of Steps: 3 Bathroom Shower/Tub: Engineer, manufacturing systems: Standard Home Care Services: No   Discharge Living Setting Plans for Discharge Living Setting: Patient's home, Lives with (comment) (brother and sister) Type of Home at Discharge: House Discharge Home Layout: One level Discharge Home Access: Stairs to enter Entrance Stairs-Rails: None Entrance Stairs-Number of Steps: 3 Discharge Bathroom Shower/Tub: Tub/shower unit Discharge Bathroom Toilet: Standard Discharge Bathroom  Accessibility: Yes How Accessible: Accessible via walker Does the patient have any problems obtaining your medications?:  (medicaid pending)   Social/Family/Support Systems Anticipated Caregiver: brother, Royston Cornea Anticipated Industrial/product designer Information: [phone] Ability/Limitations of Caregiver: Royston Cornea and Landa Pine work third shift but can have someone stay with [facility] over night Caregiver Availability: 24/7 Discharge Plan Discussed with Primary Caregiver: Yes Is Caregiver In Agreement with Plan?: Yes Does Caregiver/Family have Issues with Lodging/Transportation while Pt is in Rehab?: No   Goals Patient/Family Goal for Rehab: PT/OT/SLP supervision Expected length of stay: 10-14 days   Decrease burden of Care through IP rehab admission: N/A   Possible need for SNF placement upon discharge:  Not anticipated.  Plan for discharge home with brother and sister Royston Cornea and [place]) providing 24/7 supervision.     Patient Condition: I have reviewed medical records from [facility], spoken with [facility] team, and patient and family member. I met with patient at the bedside for inpatient rehabilitation assessment.  Patient will benefit from ongoing PT, OT, and SLP, can actively participate in 3 hours of therapy a day 5 days of the week, and can make measurable gains during the admission.  Patient will also benefit from the coordinated team approach during an Inpatient Acute Rehabilitation admission.  The patient will receive intensive therapy as well as Rehabilitation physician, nursing, social worker, and care management interventions.  Due to safety, skin/wound care, disease management, medication administration, pain management, and patient education the patient requires 24 hour a day rehabilitation nursing.  The patient is currently mod assist with mobility and basic ADLs.  Discharge setting and therapy post discharge at home is anticipated.  Patient has agreed to participate in the Acute Inpatient  Rehabilitation Program and will admit today.   Preadmission Screen Completed By:  Mickey Alar, PT, DPT 02/24/2024 3:40 PM ______________________________________________________________________   Discussed status with Dr. Rayleen Cal on 02/25/24  at 9:07 AM  and received approval for admission today.   Admission Coordinator:  Keya Wynes E Kawanda Drumheller, PT, DPT time 9:07 AM Alanna Hu 02/25/24     Assessment/Plan: Diagnosis: left frontal and basal ganglia infarcts Does the need  for close, 24 hr/day Medical supervision in concert with the patient's rehab needs make it unreasonable for this patient to be served in a less intensive setting? Yes Co-Morbidities requiring supervision/potential complications: prior CVA, HTN, HIV Due to bladder management, bowel management, safety, skin/wound care, disease management, medication administration, pain management, and patient education, does the patient require 24 hr/day rehab nursing? Yes Does the patient require coordinated care of a physician, rehab nurse, PT, OT, and SLP to address physical and functional deficits in the context of the above medical diagnosis(es)? Yes Addressing deficits in the following areas: balance, endurance, locomotion, strength, transferring, bowel/bladder control, bathing, dressing, feeding, grooming, toileting, cognition, and psychosocial support Can the patient actively participate in an intensive therapy program of at least 3 hrs of therapy 5 days a week? Yes The potential for patient to make measurable gains while on inpatient rehab is excellent Anticipated functional outcomes upon discharge from inpatient rehab: supervision PT, supervision OT, supervision SLP Estimated rehab length of stay to reach the above functional goals is: 10-14 days Anticipated discharge destination: Home 10. Overall Rehab/Functional Prognosis: excellent     MD Signature: Rawland Caddy, MD, [facility] Physical Medicine & Rehabilitation Medical  Director Rehabilitation Services 02/25/2024           Revision History

## 2024-02-25 NOTE — Discharge Summary (Signed)
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 positioning of the left vocal folds with prominence of the laryngeal ventricle. Upper chest: Paraseptal and centrilobular emphysema. Review of the MIP images confirms the above findings CTA HEAD FINDINGS ANTERIOR CIRCULATION: The intracranial ICAs are patent bilaterally. Mild atherosclerosis of the carotid siphons without significant stenosis. No high-grade stenosis, proximal occlusion, aneurysm, or vascular malformation. MCAs: Patent bilaterally. There is mild narrowing of an M2 inferior division branch of the right MCA. Additional mild irregularity and narrowing of multiple M2 inferior division branches of the left MCA. Few areas of mild outpouching of left M2 branches noted. There is focal severe stenosis of a proximal M3 branch of the left MCA. ACAs: The A1 segments are patent bilaterally. Mild narrowing of the left A2 segment. There is multifocal severe stenosis of the left A3 and A4 segments. There is focal occlusion of the proximal A3 segment of the left ACA with reconstitution of the A4 segment just above the genu of the corpus callosum. POSTERIOR CIRCULATION: No significant stenosis, proximal occlusion, aneurysm, or vascular malformation.  PCAs: The posterior cerebral arteries are patent bilaterally. Pcomm: Visualized on the right. SCAs: The superior cerebellar arteries are patent bilaterally. Basilar artery: Patent AICAs: Patent PICAs: Patent Vertebral arteries: The intracranial vertebral arteries are patent. Venous sinuses: As permitted by contrast timing, patent. Anatomic variants: None Review of the MIP images confirms the above findings CT Brain Perfusion Findings: ASPECTS: 10 CBF (<30%) Volume: 7mL Perfusion (Tmax>6.0s) volume: 14mL Mismatch Volume: 7mL Infarction Location:Focus of core infarct in the anterior parasagittal left frontal lobe involving the left ACA territory with corresponding region of elevated T-max. Additional region of elevated T-max more posteriorly within the left frontoparietal lobes within the distal left ACA territory. IMPRESSION: 1.Occlusion of the proximal A3 segment of the left ACA with reconstitution of the vessel at the A4 segment. 2. 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 3. Focus of core infarct in the anterior parasagittal left frontal lobe involving the left ACA territory with matched region of elevated T-max. Additional region of elevated T-max more posteriorly in the frontoparietal lobes involving the distal left ACA territory concerning for small region of hypoperfusion (volume likely less than 10 mL). 4. Additional multifocal stenosis involving bilateral M2 branches and additional focal severe stenosis of a proximal left M3 branch. There are a few subtle areas of outpouching of left M2 branches. Vascular irregularity and multiple intracranial stenoses with relatively mild atherosclerosis raising possibility of CNS vasculitis. 5. No acute intracranial hemorrhage. Similar age-indeterminate insult in the left frontal periventricular white matter. 6. 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 Recommend nonemergent correlation with direct visualization. Impression #1-4  results were called by telephone at the time of interpretation on 02/18/2024 at 7:29 pm to provider Dr. Bonnita Buttner, Who verbally acknowledged these results. Electronically Signed: By: Denny Flack M.D. On: 02/18/2024 19:34   CT C-SPINE NO CHARGE Result Date: 02/18/2024 CLINICAL DATA:  Neck trauma. Midline tenderness. Blurred vision and weakness. EXAM: CT CERVICAL SPINE WITHOUT CONTRAST TECHNIQUE: Multidetector CT imaging of the cervical spine was performed without intravenous contrast. Multiplanar CT image reconstructions were also generated. RADIATION DOSE REDUCTION: This exam was performed according to the departmental dose-optimization program which includes automated exposure control, adjustment of the mA and/or kV according to patient size and/or use of iterative reconstruction technique. COMPARISON:  CT cervical spine 06/25/2019 FINDINGS: Alignment: The cervical vertebral bodies are normally aligned. Straightening of the normal cervical lordosis. Skull base and vertebrae: No acute fracture. No primary bone lesion or focal pathologic process. Soft tissues and spinal canal: No prevertebral fluid or swelling. No visible canal hematoma. Disc levels: Age advanced degenerative cervical spondylosis with multilevel disc disease. Disc space narrowing and osteophytic spurring but no obvious large disc protrusions or significant canal stenosis. Uncinate spurring changes at multiple levels contributing to bony foraminal stenosis. This is most significant bilaterally at C3-4, on the left at C4-5 and on the right at C5-6. Upper chest: No significant findings. Other: No neck mass or adenopathy.  The thyroid gland is normal. IMPRESSION: 1. No acute bony findings. 2. Age advanced degenerative cervical spondylosis with multilevel disc disease and uncinate spurring changes contributing to bony foraminal stenosis as detailed above. Electronically Signed   By: Marrian Siva M.D.   On: 02/18/2024 18:57   ECHOCARDIOGRAM  COMPLETE Result Date: 02/18/2024    ECHOCARDIOGRAM REPORT   Patient Name:   Juan Townsend Date of Exam: 02/18/2024 Medical Rec #:  [id number]     Height:       69.0 in Accession #:    [id number]    Weight:       129.6 lb Date of Birth:  [birth year]     BSA:          1.718 m Patient Age:    48 years      BP:           189/123 mmHg Patient Gender: M             HR:           102 bpm. Exam Location:  Inpatient Procedure: 2D Echo (Both Spectral and Color Flow Doppler were utilized during            procedure). STAT ECHO Indications:    stroke  History:        Patient has no prior history of Echocardiogram examinations.                 HIV; Risk Factors:Hypertension, Dyslipidemia and Current Smoker.  Sonographer:    Dione Franks RDCS Referring Phys: [id number] Kendra Pavy SMITH IMPRESSIONS  1. Limited study due to patient needing emergent non-cardiac imaging for stroke. Intracavitary gradient related to dynamic function. Left ventricular ejection fraction, by estimation, is 65 to 70%. The left ventricle has normal function. The left ventricle has no regional wall motion abnormalities. There is severe concentric left ventricular hypertrophy. Left ventricular diastolic parameters are consistent with Grade I diastolic dysfunction (impaired relaxation).  2. Right ventricular systolic function is hyperdynamic. The right ventricular size is normal.  3. The mitral valve is normal in structure. No evidence of mitral valve regurgitation. No evidence of mitral stenosis.  4. The aortic valve is tricuspid. Aortic valve regurgitation is not visualized. No aortic stenosis is present. Comparison(s): No prior Echocardiogram. Conclusion(s)/Recommendation(s): Severe left ventricular hypertrophy could be related to long standing uncontroled hypertension. If this is not the clinical scenario, consider additional future cardiac imaging. FINDINGS  Left Ventricle: Limited study due to patient needing emergent non-cardiac imaging for stroke.  Intracavitary gradient related to dynamic function. Left ventricular ejection fraction, by estimation, is 65 to 70%. The left ventricle has normal function. The left ventricle has no regional wall motion abnormalities. The left ventricular internal cavity size was normal in size. There is severe concentric left ventricular hypertrophy. Left ventricular diastolic  parameters are consistent with Grade I diastolic dysfunction (impaired relaxation). Right Ventricle: The right ventricular size is normal. No increase in right ventricular wall thickness. Right ventricular systolic function is hyperdynamic. Left Atrium: Left atrial size was normal in size. Right Atrium: Right atrial size was normal in size. Pericardium: Trivial pericardial effusion is present. Mitral Valve: The mitral valve is normal in structure. No evidence of mitral valve regurgitation. No evidence of mitral valve stenosis. Tricuspid Valve: The tricuspid valve is normal in structure. Tricuspid valve regurgitation is not demonstrated. No evidence of tricuspid stenosis. Aortic Valve: The aortic valve is tricuspid. Aortic valve regurgitation is not visualized. No aortic stenosis is present. Pulmonic Valve: The pulmonic valve was normal in structure. Pulmonic valve regurgitation is not visualized. No evidence of pulmonic stenosis. Aorta: The aortic root is normal in size and structure. IAS/Shunts: The atrial septum is grossly normal.  LEFT VENTRICLE PLAX 2D LVIDd:         3.60 cm   Diastology LVIDs:         2.20 cm   LV e' medial:    5.00 cm/s LV PW:         1.70 cm   LV E/e' medial:  12.4 LV IVS:        1.20 cm   LV e' lateral:   7.83 cm/s LVOT diam:     2.20 cm   LV E/e' lateral: 7.9 LV SV:         86 LV SV Index:   50 LVOT Area:     3.80 cm  RIGHT VENTRICLE RV Basal diam:  2.60 cm RV S prime:     18.60 cm/s TAPSE (M-mode): 2.5 cm LEFT ATRIUM             Index        RIGHT ATRIUM           Index LA diam:        3.00 cm 1.75 cm/m   RA Area:     12.50 cm  LA Vol (A2C):   53.8 ml 31.31 ml/m  RA Volume:   29.40 ml  17.11 ml/m LA Vol (A4C):   31.4 ml 18.27 ml/m LA Biplane Vol: 41.7 ml 24.27 ml/m  AORTIC VALVE LVOT Vmax:   143.00 cm/s LVOT Vmean:  93.400 cm/s LVOT VTI:    0.225 m  AORTA Ao Root diam: 3.30 cm MITRAL VALVE MV Area (PHT): 3.72 cm    SHUNTS MV Decel Time: 204 msec    Systemic VTI:  0.22 m MV E velocity: 62.20 cm/s  Systemic Diam: 2.20 cm MV A velocity: 89.30 cm/s MV E/A ratio:  0.70 Gloriann Larger MD Electronically signed by Gloriann Larger MD Signature Date/Time: 02/18/2024/6:53:08 PM    Final    CT HEAD CODE STROKE WO CONTRAST Result Date: 02/18/2024 CLINICAL DATA:  Code stroke. Provided history: Neuro deficit, acute, stroke suspected. EXAM: CT HEAD WITHOUT CONTRAST TECHNIQUE: Contiguous axial images were obtained from the base of the skull through the vertex without intravenous contrast. RADIATION DOSE REDUCTION: This exam was performed according to the departmental dose-optimization program which includes automated exposure control, adjustment of the mA and/or kV according to patient size and/or use of iterative reconstruction technique. COMPARISON:  Head CT 06/25/2019. FINDINGS: Brain: No age-advanced or lobar predominant cerebral atrophy. Chronic lacunar infarct versus prominent perivascular space within the left lentiform nucleus, new from the prior head CT of 06/25/2019 (series 2, image 14) (series 5, image 37). 6 mm focus  of hypodensity within the mid left frontal lobe white matter, new from the prior CT (series 2, image 21). Background patchy and ill-defined hypoattenuation within the cerebral white matter, overall moderate severity and greater than expected for age. There is no acute intracranial hemorrhage. No demarcated cortical infarct. No extra-axial fluid collection. No evidence of an intracranial mass. No midline shift. Vascular: No hyperdense vessel.  Atherosclerotic calcifications. Skull: No calvarial fracture or  aggressive osseous lesion. Sinuses/Orbits: No mass or acute finding within the imaged orbits. No significant paranasal sinus disease at the imaged levels. ASPECTS ([place] Stroke Program Early CT Score) - Ganglionic level infarction (caudate, lentiform nuclei, internal capsule, insula, M1-M3 cortex): 7 - Supraganglionic infarction (M4-M6 cortex): 3 Total score (0-10 with 10 being normal): 10 (when discounting a potential chronic lacunar infarct within the left basal ganglia). Impressions #1 and #2 communicated to Dr. Renaee Caro at 1:42 pmon 6/10/2025by text page via the [facility] messaging system. IMPRESSION: 1. No acute intracranial hemorrhage or acute demarcated cortical infarction. 2. 6 mm insult within the mid left frontal lobe white matter, nonspecific but potentially reflecting an age-indeterminate infarct. 3. Chronic lacunar infarct versus prominent perivascular space within the left basal ganglia, also new from the prior CT. 4. Background age-advanced nonspecific cerebral white matter disease, progressed. Electronically Signed   By: Bascom Lily D.O.   On: 02/18/2024 13:44    Microbiology: Results for orders placed or performed during the hospital encounter of 07/18/20  Gastrointestinal Panel by PCR , Stool     Status: Abnormal   Collection Time: 07/18/20  7:31 AM   Specimen: Stool  Result Value Ref Range Status   Campylobacter species NOT DETECTED NOT DETECTED Final   Plesimonas shigelloides NOT DETECTED NOT DETECTED Final   Salmonella species NOT DETECTED NOT DETECTED Final   Yersinia enterocolitica NOT DETECTED NOT DETECTED Final   Vibrio species NOT DETECTED NOT DETECTED Final   Vibrio cholerae NOT DETECTED NOT DETECTED Final   Enteroaggregative E coli (EAEC) NOT DETECTED NOT DETECTED Final   Enteropathogenic E coli (EPEC) NOT DETECTED NOT DETECTED Final   Enterotoxigenic E coli (ETEC) NOT DETECTED NOT DETECTED Final   Shiga like toxin producing E coli (STEC) NOT DETECTED NOT DETECTED Final    Shigella/Enteroinvasive E coli (EIEC) DETECTED (A) NOT DETECTED Final    Comment: RESULT CALLED TO, READ BACK BY AND VERIFIED WITH:  LORI BERDIK AT 1357 07/18/20 SDR    Cryptosporidium NOT DETECTED NOT DETECTED Final   Cyclospora cayetanensis NOT DETECTED NOT DETECTED Final   Entamoeba histolytica NOT DETECTED NOT DETECTED Final   Giardia lamblia NOT DETECTED NOT DETECTED Final   Adenovirus F40/41 NOT DETECTED NOT DETECTED Final   Astrovirus NOT DETECTED NOT DETECTED Final   Norovirus GI/GII NOT DETECTED NOT DETECTED Final   Rotavirus A NOT DETECTED NOT DETECTED Final   Sapovirus (I, II, IV, and V) NOT DETECTED NOT DETECTED Final    Comment: Performed at [facility], [address] [place], [place] [address]  C Difficile Quick Screen w PCR reflex     Status: None   Collection Time: 07/18/20  8:31 AM   Specimen: STOOL  Result Value Ref Range Status   C Diff antigen NEGATIVE NEGATIVE Final   C Diff toxin NEGATIVE NEGATIVE Final   C Diff interpretation No C. difficile detected.  Final    Comment: Performed at [facility], [address], [place], [place] [address]    Labs: CBC: Recent Labs  Lab 02/18/24 1310  02/18/24 1314 02/18/24 2335 02/19/24 0515 02/20/24 0606 02/21/24 0756 02/25/24 0440  WBC 7.5  --  9.6 9.3 5.4 4.4 5.0  NEUTROABS 5.7  --   --   --   --   --   --   HGB 12.3*   < > 12.0* 12.0* 11.0* 9.9* 9.5*  HCT 34.8*   < > 32.5* 33.8* 30.9* 28.3* 27.9*  MCV 85.7  --  86.2 86.0 87.3 88.4 89.4  PLT 51*  --  81* 84* 109* 122* 147*   < > = values in this interval not displayed.   Basic Metabolic Panel: Recent Labs  Lab 02/18/24 1610 02/18/24 2335 02/19/24 0515 02/20/24 0606 02/21/24 0756 02/22/24 0506 02/23/24 0723 02/24/24 0355 02/25/24 0440  NA 133*  --  131*   < > 136 137 137 133* 132*  K 3.6  --  3.9   < > 3.5 3.4* 3.6 3.8 3.6  CL 96*  --  95*   < > 105 104 103 100 101  CO2 24  --  22   < > 21* 24 21* 22 24  GLUCOSE 117*  --  105*   < >  88 104* 107* 104* 105*  BUN 45*  --  35*   < > 20 21* 24* 23* 24*  CREATININE 2.51*  --  2.20*   < > 2.18* 2.51* 2.61* 2.70* 2.76*  CALCIUM  9.3  --  8.9   < > 8.7* 8.7* 9.2 8.7* 8.5*  MG 2.1 2.1 2.0  --   --   --   --   --   --   PHOS 3.9 4.4  --   --   --   --   --   --   --    < > = values in this interval not displayed.   Liver Function Tests: Recent Labs  Lab 02/18/24 1310 02/18/24 1610  AST 39 37  ALT 16 16  ALKPHOS 60 54  BILITOT 2.0* 1.8*  PROT 9.1* 8.9*  ALBUMIN 4.1 4.0   CBG: Recent Labs  Lab 02/24/24 1630 02/24/24 2022 02/25/24 0036 02/25/24 0418 02/25/24 0744  GLUCAP 150* 104* 104* 116* 106*    Discharge time spent: greater than 30 minutes.  Signed: Danette Duos, MD [facility] 02/25/2024
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
## 2024-02-25 NOTE — Progress Notes (Signed)
 Inpatient Rehab Admissions Coordinator:   I have a bed available for this patient to admit to CIR today.  Dr. Landrum Pink in agreement and [facility] aware.  I will notify pt/family and make arrangements.   Loye Rumble, PT, DPT Admissions Coordinator [phone] 02/25/24  10:38 AM

## 2024-02-25 NOTE — Progress Notes (Signed)
 Orthopedic Tech Progress Note Patient Details:  Juan Townsend [birth year] [id number] Pt ignored me multiple times when I asked to apply Prafo boot. Therefore, I left it at bedside.  Ortho Devices Type of Ortho Device: Prafo boot/shoe Ortho Device/Splint Location: LLE Ortho Device/Splint Interventions: Ordered   Post Interventions Patient Tolerated: Refused intervention  Rayna Calkin 02/25/2024, 8:35 PM

## 2024-02-26 DIAGNOSIS — N179 Acute kidney failure, unspecified: Secondary | ICD-10-CM

## 2024-02-26 DIAGNOSIS — D638 Anemia in other chronic diseases classified elsewhere: Secondary | ICD-10-CM

## 2024-02-26 LAB — CBC WITH DIFFERENTIAL/PLATELET
Abs Immature Granulocytes: 0.02 10*3/uL (ref 0.00–0.07)
Basophils Absolute: 0 10*3/uL (ref 0.0–0.1)
Basophils Relative: 1 %
Eosinophils Absolute: 0.1 10*3/uL (ref 0.0–0.5)
Eosinophils Relative: 2 %
HCT: 28.5 % — ABNORMAL LOW (ref 39.0–52.0)
Hemoglobin: 9.7 g/dL — ABNORMAL LOW (ref 13.0–17.0)
Immature Granulocytes: 0 %
Lymphocytes Relative: 25 %
Lymphs Abs: 1.3 10*3/uL (ref 0.7–4.0)
MCH: 30.6 pg (ref 26.0–34.0)
MCHC: 34 g/dL (ref 30.0–36.0)
MCV: 89.9 fL (ref 80.0–100.0)
Monocytes Absolute: 0.7 10*3/uL (ref 0.1–1.0)
Monocytes Relative: 13 %
Neutro Abs: 3.1 10*3/uL (ref 1.7–7.7)
Neutrophils Relative %: 59 %
Platelets: 224 10*3/uL (ref 150–400)
RBC: 3.17 MIL/uL — ABNORMAL LOW (ref 4.22–5.81)
RDW: 13.2 % (ref 11.5–15.5)
WBC: 5.3 10*3/uL (ref 4.0–10.5)
nRBC: 0 % (ref 0.0–0.2)

## 2024-02-26 LAB — COMPREHENSIVE METABOLIC PANEL WITH GFR
ALT: 21 U/L (ref 0–44)
AST: 22 U/L (ref 15–41)
Albumin: 3.3 g/dL — ABNORMAL LOW (ref 3.5–5.0)
Alkaline Phosphatase: 39 U/L (ref 38–126)
Anion gap: 8 (ref 5–15)
BUN: 26 mg/dL — ABNORMAL HIGH (ref 6–20)
CO2: 25 mmol/L (ref 22–32)
Calcium: 8.9 mg/dL (ref 8.9–10.3)
Chloride: 101 mmol/L (ref 98–111)
Creatinine, Ser: 2.86 mg/dL — ABNORMAL HIGH (ref 0.61–1.24)
GFR, Estimated: 27 mL/min — ABNORMAL LOW (ref >60)
Glucose, Bld: 94 mg/dL (ref 70–99)
Potassium: 3.7 mmol/L (ref 3.5–5.1)
Sodium: 134 mmol/L — ABNORMAL LOW (ref 135–145)
Total Bilirubin: 1.1 mg/dL (ref 0.0–1.2)
Total Protein: 7.7 g/dL (ref 6.5–8.1)

## 2024-02-26 NOTE — Evaluation (Addendum)
 Speech Language Pathology Assessment and Plan  Patient Details  Name: Juan Townsend MRN: [id number] Date of Birth: 11/02/1975  SLP Diagnosis: Cognitive Impairments;Dysphagia  Rehab Potential: Excellent ELOS: 10-14 days    Today's Date: 02/26/2024 SLP Individual Time: 1300-1400 SLP Individual Time Calculation (min): 60 min   Hospital Problem: Principal Problem:   Acute right ACA stroke [facility])  Past Medical History:  Past Medical History:  Diagnosis Date   HIV (human immunodeficiency virus infection) (HCC)    Hypercholesteremia    Hypertension    Stroke [facility])    Past Surgical History: History reviewed. No pertinent surgical history.  Assessment / Plan / Recommendation Clinical Impression HPI:  Pt is a 48 y/o male with PMH of CVA without residual deficits, HIV, HLD, and HTN who was admitted to [facility] on 6/10 with L hemiparesis, visual changes, and severe HTN.  In ED BP 261/129, NIHSS 5, Na 129, creatinine 1.61.  CT head showed 6 mm infarct within the mid left frontal lobe white matter, chronic lacunar infarct in the left basal ganglia.  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  Repeat CTs stable.  EF 65-70% with severe LVH.  Neurology recommendations for aspirin  and plavix  x3 weeks then aspirin  alone.  Hospital course complicated by AKI, nephrology consulted and felt likely related to volume/BP management and will likely continue to improve over time.  Therapy evaluations completed and pt was recommended for CIR   Clinical Impression:  Bedside Swallow Evaluation: A bedside swallow evaluation was completed to assess for s/sx of oropharyngeal dysphagia. Oral mechanism exam revealed L facial asymmetry and lingual deviation, though functional for oral clearance and mastication. POs administered included NTL, puree and solids. Patient with timely mastication and complete oral clearance. With use of L head turn, patient with x1 cough after NTL which may  indicate bolus misdirection. Recommend D3/NTL diet with use of standardized precautions including sitting upright during PO, taking small bites/sips at a slow rate and L head turn per MBS recommendations. Recommend full supervision during mealtimes.  Cognitive-Linguistic: Patient was evaluated via the Cognistat to assess cognitive linguistic functioning. Patient with moderate-severe cognitive deficits in the subtests of orientation to time, attention, calculations, executive functioning, registration and delayed recall. Initially, patient unaware of cognitive deficits, however reports he preformed terrible on standardized assessment upon completion. Sustained attention intact, though with flat affect. Patient with occasional need for increased processing time. Recommend targeting problem solving and short term memory during inpatient stay.  Patient with functional expressive/receptive language according to the standardized assessment, though patient reports occasional word finding difficulty and SLP observed occasional perseveration during cognitive portion of the evaluation. Recommend continuing to evaluate language.  Dysarthria: Patient with hoarse vocal quality and occasional imprecise articulation in which he is aware. Patient is 95% intelligible at the conversational level.  Pt would benefit from skilled ST services to maximize cognition and dysphagia in order to maximize functional independence at d/c. Anticipate patient will require 24 hour supervision at d/c and f/u SLP services.    Skilled Therapeutic Interventions          Patient evaluated using a standardized cognitive linguistic assessment and bedside swallow evaluation to assess current cognitive, communicative and swallowing function. See above for details.    SLP Assessment  Patient will need skilled Speech Lanaguage Pathology Services during CIR admission    Recommendations  SLP Diet Recommendations: Dysphagia 3 (Mech  soft);Nectar Liquid Administration via: Cup Medication Administration: Whole meds with puree  Supervision: Full supervision/cueing for compensatory strategies;Staff to assist with self feeding Compensations: Slow rate;Small sips/bites;Hard cough after swallow Postural Changes and/or Swallow Maneuvers: Head turn left during swallow;Seated upright 90 degrees Oral Care Recommendations: Oral care before and after PO;Oral care BID Recommendations for Other Services: Neuropsych consult Patient destination: Home Follow up Recommendations: Home Health SLP;Outpatient SLP;24 hour supervision/assistance Equipment Recommended: None recommended by SLP    SLP Frequency 3 to 5 out of 7 days   SLP Duration  SLP Intensity  SLP Treatment/Interventions 10-14 days  Minumum of 1-2 x/day, 30 to 90 minutes  Cognitive remediation/compensation;Cueing hierarchy;Dysphagia/aspiration precaution training;Functional tasks;Internal/external aids;Oral motor exercises;Patient/family education;Therapeutic Activities    Pain Denies  SLP Evaluation Cognition Overall Cognitive Status: Impaired/Different from baseline Arousal/Alertness: Awake/alert Orientation Level: Oriented to person;Oriented to place;Oriented to situation;Disoriented to time Attention: Sustained Sustained Attention: Appears intact Memory: Impaired Memory Impairment: Decreased recall of new information;Decreased short term memory Decreased Short Term Memory: Verbal basic;Functional basic Awareness: Impaired Awareness Impairment: Intellectual impairment Problem Solving: Impaired Problem Solving Impairment: Verbal basic;Functional basic  Comprehension Auditory Comprehension Overall Auditory Comprehension: Appears within functional limits for tasks assessed Expression Expression Primary Mode of Expression: Verbal Verbal Expression Overall Verbal Expression: Impaired Initiation: Impaired Repetition: No impairment Naming: No  impairment Pragmatics: Impairment Impairments: Abnormal affect;Eye contact;Monotone Oral Motor Oral Motor/Sensory Function Overall Oral Motor/Sensory Function: Mild impairment Facial ROM: Reduced left;Suspected CN VII (facial) dysfunction Facial Symmetry: Abnormal symmetry left;Suspected CN VII (facial) dysfunction Lingual ROM: Within Functional Limits Lingual Symmetry: Abnormal symmetry left;Suspected CN XII (hypoglossal) dysfunction Lingual Strength: Within Functional Limits Velum: Within Functional Limits Mandible: Within Functional Limits Motor Speech Overall Motor Speech: Impaired Respiration: Within functional limits Phonation: Breathy;Hoarse Resonance: Within functional limits Articulation: Impaired Intelligibility: Intelligibility reduced Conversation: 75-100% accurate Motor Planning: Within functional limits  Care Tool Care Tool Cognition Ability to hear (with hearing aid or hearing appliances if normally used Ability to hear (with hearing aid or hearing appliances if normally used): 0. Adequate - no difficulty in normal conservation, social interaction, listening to TV   Expression of Ideas and Wants Expression of Ideas and Wants: 3. Some difficulty - exhibits some difficulty with expressing needs and ideas (e.g, some words or finishing thoughts) or speech is not clear   Understanding Verbal and Non-Verbal Content Understanding Verbal and Non-Verbal Content: 3. Usually understands - understands most conversations, but misses some part/intent of message. Requires cues at times to understand  Memory/Recall Ability Memory/Recall Ability : Current season;That he or she is in a hospital/hospital unit    Bedside Swallowing Assessment General Previous Swallow Assessment: 6/12 Diet Prior to this Study: Dysphagia 3 (mechanical soft);Mildly thick liquids (Level 2, nectar thick) Respiratory Status: Room air Behavior/Cognition: Alert;Cooperative;Requires cueing Oral Cavity -  Dentition: Poor condition Self-Feeding Abilities: Needs assist Vision: Functional for self-feeding Patient Positioning: Upright in bed Volitional Cough: Strong Volitional Swallow: Able to elicit  Ice Chips Ice chips: Not tested Thin Liquid Thin Liquid: Not tested Nectar Thick Nectar Thick Liquid: Impaired Presentation: Cup Pharyngeal Phase Impairments: Cough - Immediate Honey Thick Honey Thick Liquid: Not tested Puree Puree: Within functional limits Presentation: Self Fed;Spoon Solid Solid: Within functional limits Presentation: Self Fed BSE Assessment Risk for Aspiration Impact on safety and function: Moderate aspiration risk Other Related Risk Factors: Previous CVA;Cognitive impairment  Short Term Goals: Week 1: SLP Short Term Goal 1 (Week 1): Patient will utilize swallowing compensatory strategies during consumption of D3/NTL diet given supervision multimodal A SLP Short Term Goal 2 (Week 1): Patient will demonstrate orientation  to self, time, place, and situation given mod multimodal A SLP Short Term Goal 3 (Week 1): Patient will demonstrate basic problem solving skills given mod multimodal a SLP Short Term Goal 4 (Week 1): Patient will recall cognitive and physical changes since admission given mod multimodal A SLP Short Term Goal 5 (Week 1): Patient will recall daily activities given mod multimodal A  Refer to Care Plan for Long Term Goals  Recommendations for other services: Neuropsych  Discharge Criteria: Patient will be discharged from SLP if patient refuses treatment 3 consecutive times without medical reason, if treatment goals not met, if there is a change in medical status, if patient makes no progress towards goals or if patient is discharged from hospital.  The above assessment, treatment plan, treatment alternatives and goals were discussed and mutually agreed upon: by patient  Aanya Haynes M.A., CCC-SLP 02/26/2024, 3:11 PM

## 2024-02-26 NOTE — Plan of Care (Signed)
 Problem: RH Balance Goal: LTG: Patient will maintain dynamic sitting balance (OT) Description: LTG:  Patient will maintain dynamic sitting balance with assistance during activities of daily living (OT) Flowsheets (Taken 02/26/2024 1540) LTG: Pt will maintain dynamic sitting balance during ADLs with: Independent with assistive device Goal: LTG Patient will maintain dynamic standing with ADLs (OT) Description: LTG:  Patient will maintain dynamic standing balance with assist during activities of daily living (OT)  Flowsheets (Taken 02/26/2024 1540) LTG: Pt will maintain dynamic standing balance during ADLs with: Supervision/Verbal cueing   Problem: Sit to Stand Goal: LTG:  Patient will perform sit to stand in prep for activites of daily living with assistance level (OT) Description: LTG:  Patient will perform sit to stand in prep for activites of daily living with assistance level (OT) Flowsheets (Taken 02/26/2024 1540) LTG: PT will perform sit to stand in prep for activites of daily living with assistance level: Supervision/Verbal cueing   Problem: RH Eating Goal: LTG Patient will perform eating w/assist, cues/equip (OT) Description: LTG: Patient will perform eating with assist, with/without cues using equipment (OT) Flowsheets (Taken 02/26/2024 1540) LTG: Pt will perform eating with assistance level of: Independent   Problem: RH Grooming Goal: LTG Patient will perform grooming w/assist,cues/equip (OT) Description: LTG: Patient will perform grooming with assist, with/without cues using equipment (OT) Flowsheets (Taken 02/26/2024 1540) LTG: Pt will perform grooming with assistance level of: Set up assist    Problem: RH Bathing Goal: LTG Patient will bathe all body parts with assist levels (OT) Description: LTG: Patient will bathe all body parts with assist levels (OT) Flowsheets (Taken 02/26/2024 1540) LTG: Pt will perform bathing with assistance level/cueing: Supervision/Verbal cueing LTG:  Position pt will perform bathing: Shower   Problem: RH Dressing Goal: LTG Patient will perform upper body dressing (OT) Description: LTG Patient will perform upper body dressing with assist, with/without cues (OT). Flowsheets (Taken 02/26/2024 1540) LTG: Pt will perform upper body dressing with assistance level of: Set up assist Goal: LTG Patient will perform lower body dressing w/assist (OT) Description: LTG: Patient will perform lower body dressing with assist, with/without cues in positioning using equipment (OT) Flowsheets (Taken 02/26/2024 1540) LTG: Pt will perform lower body dressing with assistance level of: Supervision/Verbal cueing   Problem: RH Toileting Goal: LTG Patient will perform toileting task (3/3 steps) with assistance level (OT) Description: LTG: Patient will perform toileting task (3/3 steps) with assistance level (OT)  Flowsheets (Taken 02/26/2024 1540) LTG: Pt will perform toileting task (3/3 steps) with assistance level: Supervision/Verbal cueing   Problem: RH Functional Use of Upper Extremity Goal: LTG Patient will use RT/LT upper extremity as a (OT) Description: LTG: Patient will use right/left upper extremity as a stabilizer/gross assist/diminished/nondominant/dominant level with assist, with/without cues during functional activity (OT) Flowsheets (Taken 02/26/2024 1540) LTG: Use of upper extremity in functional activities: LUE as gross assist level LTG: Pt will use upper extremity in functional activity with assistance level of: Minimal Assistance - Patient > 75%   Problem: RH Toilet Transfers Goal: LTG Patient will perform toilet transfers w/assist (OT) Description: LTG: Patient will perform toilet transfers with assist, with/without cues using equipment (OT) Flowsheets (Taken 02/26/2024 1540) LTG: Pt will perform toilet transfers with assistance level of: Supervision/Verbal cueing   Problem: RH Tub/Shower Transfers Goal: LTG Patient will perform tub/shower  transfers w/assist (OT) Description: LTG: Patient will perform tub/shower transfers with assist, with/without cues using equipment (OT) Flowsheets (Taken 02/26/2024 1540) LTG: Pt will perform tub/shower stall transfers with assistance level of:  Supervision/Verbal cueing   Problem: RH Memory Goal: LTG Patient will demonstrate ability for day to day recall/carry over during activities of daily living with assistance level (OT) Description: LTG:  Patient will demonstrate ability for day to day recall/carry over during activities of daily living with assistance level (OT). Flowsheets (Taken 02/26/2024 1540) LTG:  Patient will demonstrate ability for day to day recall/carry over during activities of daily living with assistance level (OT): Supervision   Problem: RH Attention Goal: LTG Patient will demonstrate this level of attention during functional activites (OT) Description: LTG:  Patient will demonstrate this level of attention during functional activites  (OT) Flowsheets (Taken 02/26/2024 1540) Patient will demonstrate this level of attention during functional activites: Selective Patient will demonstrate above attention level in the following environment: Home LTG: Patient will demonstrate this level of attention during functional activites (OT): Supervision   Problem: RH Awareness Goal: LTG: Patient will demonstrate awareness during functional activites type of (OT) Description: LTG: Patient will demonstrate awareness during functional activites type of (OT) Flowsheets (Taken 02/26/2024 1540) Patient will demonstrate awareness during functional activites type of: Emergent LTG: Patient will demonstrate awareness during functional activites type of (OT): Supervision

## 2024-02-26 NOTE — Progress Notes (Signed)
 Patient ID: Juan Townsend, male   DOB: 04/01/1976, 48 y.o.   MRN: [id number] Met with the patient to review the current medical situation, rehab process, team conference and plan of care. Patient with delayed response, little verbalization and simple responses, nods to questions. Discussed secondary risk management including, previous CVA, HTN, HLD (LDL 99/Trig 201) on Lipitor.  Patient reports no PCP or insurance and was not taking meds PTA. Reports he was smoking PTA.  Reviewed rationale for for taking medications and smoking cessation. Continue to follow along to address educational needs to facilitate preparation for discharge.  Reviewed with staff full supervision for meals and need for assistance to self feed (frontal lobe CVA). Naoma Bacca

## 2024-02-26 NOTE — Progress Notes (Signed)
 Admit: 02/25/2024 LOS: 1  Juan Townsend is a 48 y.o. male admitted for CVA now transitioned to CIR. Nephrology consulted for rising creatinine in the setting now resolved hypertensive emergency.  Subjective:  Doing well and working with OT this morning.  Reports he feels thirsty but has not had much to drink.  06/17 0701 - 06/18 0700 In: 240 [P.O.:240] Out: 500 [Urine:500]  Filed Weights   02/25/24 1450  Weight: 59 kg    Scheduled Meds:  amLODipine   10 mg Oral Daily   aspirin  EC  81 mg Oral Daily   atorvastatin   20 mg Oral Q supper   bictegravir-emtricitabine -tenofovir  AF  1 tablet Oral Daily   calcium  carbonate  1 tablet Oral Q lunch   clopidogrel   75 mg Oral Daily   enoxaparin  (LOVENOX ) injection  30 mg Subcutaneous Q24H   hydrALAZINE   100 mg Oral Q8H   metoprolol  tartrate  50 mg Oral BID   nicotine   21 mg Transdermal Daily   mouth rinse  15 mL Mouth Rinse 4 times per day   pantoprazole   40 mg Oral Daily   Continuous Infusions: PRN Meds:.acetaminophen , alum & mag hydroxide-simeth, bisacodyl, diphenhydrAMINE, docusate sodium , guaiFENesin-dextromethorphan, lidocaine, polyethylene glycol, prochlorperazine **OR** prochlorperazine **OR** prochlorperazine, sodium phosphate, traZODone  Current Labs: reviewed    Physical Exam:  Blood pressure (!) 155/97, pulse 77, temperature 98.6 F (37 C), resp. rate 18, height 5' 6 (1.676 m), weight 59 kg, SpO2 99%. General: Chronically ill-appearing, no acute distress Cardio: RRR, no murmur on exam Pulm: clear, No increased work of breathing Abdomen: Soft, bowel sounds present, nontender Extremity: No peripheral edema   A AKI on CKD 3A: Cr uptrending.  He has had 500 cc urine output in the last 24 hours.  Does not appear to have great oral intake per chart review.  Will encourage p.o. intake and avoid IVF at this time.  Trend BMP daily.  Kidney injury likely secondary to hypertensive emergency and exacerbated by subsequent BP correction.   Suspect kidney function will improve over time with equilibrating to new BP threshold. Thrombocytopenia: Improving and stable.  Outpatient nephrology follow-up to consider testing for TMA.  HTN: Blood pressure stable with SBP 150-170. Continue current medications as scheduled. Once Cr improving can add ARB.  Clem Currier, DO 02/26/2024, 8:07 AM  Recent Labs  Lab 02/24/24 0355 02/25/24 0440 02/26/24 0503  NA 133* 132* 134*  K 3.8 3.6 3.7  CL 100 101 101  CO2 22 24 25   GLUCOSE 104* 105* 94  BUN 23* 24* 26*  CREATININE 2.70* 2.76* 2.86*  CALCIUM  8.7* 8.5* 8.9   Recent Labs  Lab 02/21/24 0756 02/25/24 0440 02/26/24 0503  WBC 4.4 5.0 5.3  NEUTROABS  --   --  3.1  HGB 9.9* 9.5* 9.7*  HCT 28.3* 27.9* 28.5*  MCV 88.4 89.4 89.9  PLT 122* 147* 224

## 2024-02-26 NOTE — Progress Notes (Signed)
 Patient ID: Juan Townsend, male   DOB: June 07, 1976, 48 y.o.   MRN: [id number]  left message for James-pt's brother to be able to complete my assessment. Will await return call.

## 2024-02-26 NOTE — Progress Notes (Signed)
 Inpatient Rehabilitation  Patient information reviewed and entered into eRehab system by [facility]. Karen Kays., CCC/SLP, PPS Coordinator.  Information including medical coding, functional ability and quality indicators will be reviewed and updated through discharge.

## 2024-02-26 NOTE — Plan of Care (Signed)
  Problem: RH Swallowing Goal: LTG Patient will consume least restrictive diet using compensatory strategies with assistance (SLP) Description: LTG:  Patient will consume least restrictive diet using compensatory strategies with assistance (SLP) Flowsheets (Taken 02/26/2024 1504) LTG: Pt Patient will consume least restrictive diet using compensatory strategies with assistance of (SLP): Modified Independent   Problem: RH Cognition - SLP Goal: RH LTG Patient will demonstrate orientation with cues Description:  LTG:  Patient will demonstrate orientation to person/place/time/situation with cues (SLP)   Flowsheets (Taken 02/26/2024 1504) LTG Patient will demonstrate orientation to:  Person  Place  Situation  Time LTG: Patient will demonstrate orientation using cueing (SLP): Minimal Assistance - Patient > 75%   Problem: RH Problem Solving Goal: LTG Patient will demonstrate problem solving for (SLP) Description: LTG:  Patient will demonstrate problem solving for basic/complex daily situations with cues  (SLP) Flowsheets (Taken 02/26/2024 1504) LTG: Patient will demonstrate problem solving for (SLP): Basic daily situations LTG Patient will demonstrate problem solving for: Minimal Assistance - Patient > 75%   Problem: RH Memory Goal: LTG Patient will use memory compensatory aids to (SLP) Description: LTG:  Patient will use memory compensatory aids to recall biographical/new, daily complex information with cues (SLP) Flowsheets (Taken 02/26/2024 1504) LTG: Patient will use memory compensatory aids to (SLP): Moderate Assistance - Patient 50 - 74%   Problem: RH Awareness Goal: LTG: Patient will demonstrate awareness during functional activites type of (SLP) Description: LTG: Patient will demonstrate awareness during functional activites type of (SLP) Flowsheets (Taken 02/26/2024 1504) Patient will demonstrate during cognitive/linguistic activities awareness type of: Intellectual LTG: Patient will  demonstrate awareness during cognitive/linguistic activities with assistance of (SLP): Minimal Assistance - Patient > 75%

## 2024-02-26 NOTE — Patient Care Conference (Signed)
 Inpatient RehabilitationTeam Conference and Plan of Care Update Date: 02/26/2024   Time: 10:49 AM    Patient Name: Juan Townsend      Medical Record Number: [id number]  Date of Birth: 04-30-1976 Sex: Male         Room/Bed: 4W09C/4W09C-01 Payor Info: Payor: /    Admit Date/Time:  02/25/2024  2:36 PM  Primary Diagnosis:  Acute right ACA stroke [facility])  Hospital Problems: Principal Problem:   Acute right ACA stroke [facility])    Expected Discharge Date: Expected Discharge Date:  (evals pending)  Team Members Present: Physician leading conference: Dr. Janeece Mechanic Social Worker Present: Adrianna Albee, LCSW Nurse Present: Forrestine Ike, RN PT Present: Javan Messing, PT OT Present: Florina Husbands, OT SLP Present: Reggie Caper, SLP PPS Coordinator present : Jestine Moron, SLP     Current Status/Progress Goal Weekly Team Focus  Bowel/Bladder      Constipation addressed Continent of bladder    Manage bowel w mod I assist    Assess need for and effectiveness of laxatives  Swallow/Nutrition/ Hydration   pending eval           ADL's                Mobility   pending eval           Communication   pending eval            Safety/Cognition/ Behavioral Observations  pending eval            Pain      N/a          Skin      N/a           Discharge Planning:  New evaluation according to acute chart going home with brother who works third shift. 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  Functional progress limited by ease of being overwhelmed and delayed processing.  Patient on target to meet rehab goals: Evals pending  *See Care Plan and progress notes for long and short-term goals.   Revisions to Treatment Plan:  N/a   Teaching Needs: Safety, medications, transfers, toileting, etc.   Current Barriers to Discharge: Decreased caregiver support and Home enviroment  access/layout  Possible Resolutions to Barriers: Family education     Medical Summary Current Status: Severe left hemiplegia.  Acute renal failure with likely underlying CKD, history of HIV, history of THC use  Barriers to Discharge: Uncontrolled Hypertension   Possible Resolutions to [employer] Focus: PT OT and speech evaluations today, nephrology consultation   Continued Need for Acute Rehabilitation Level of Care: The patient requires daily medical management by a physician with specialized training in physical medicine and rehabilitation for the following reasons: Direction of a multidisciplinary physical rehabilitation program to maximize functional independence : Yes Medical management of patient stability for increased activity during participation in an intensive rehabilitation regime.: Yes Analysis of laboratory values and/or radiology reports with any subsequent need for medication adjustment and/or medical intervention. : Yes   I attest that I was present, lead the team conference, and concur with the assessment and plan of the team.   Forrestine Ike B 02/26/2024, 3:57 PM
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
## 2024-02-26 NOTE — Progress Notes (Signed)
 PROGRESS NOTE   Subjective/Complaints:  Pt having OT eval , seen by Nephro resident today  Limited verbal output but pt alert  ROS- no breathing issues , no pain   Objective:   No results found. Recent Labs    02/25/24 0440 02/26/24 0503  WBC 5.0 5.3  HGB 9.5* 9.7*  HCT 27.9* 28.5*  PLT 147* 224   Recent Labs    02/25/24 0440 02/26/24 0503  NA 132* 134*  K 3.6 3.7  CL 101 101  CO2 24 25  GLUCOSE 105* 94  BUN 24* 26*  CREATININE 2.76* 2.86*  CALCIUM  8.5* 8.9    Intake/Output Summary (Last 24 hours) at 02/26/2024 0854 Last data filed at 02/26/2024 0600 Gross per 24 hour  Intake 240 ml  Output 500 ml  Net -260 ml        Physical Exam: Vital Signs Blood pressure (!) 155/97, pulse 77, temperature 98.6 F (37 C), resp. rate 18, height 5' 6 (1.676 m), weight 59 kg, SpO2 99%.   General: No acute distress Mood and affect are appropriate Heart: Regular rate and rhythm no rubs murmurs or extra sounds Lungs: Clear to auscultation, breathing unlabored, no rales or wheezes Abdomen: Positive bowel sounds, soft nontender to palpation, nondistended Extremities: No clubbing, cyanosis, or edema Skin: No evidence of breakdown, no evidence of rash Neurologic: Cranial nerves II through XII intact, motor strength is 5/5 in RIght deltoid, bicep, tricep, grip, hip flexor, knee extensors, ankle dorsiflexor and plantar flexor\ 2- left delt, bi, tri, 0/5 grip, 2- hip/knee ext synergy Sensory exam normal sensation to light touch and proprioception in bilateral upper and reduced LT lower extremities Cerebellar exam cannot perform on left due to weakness  Musculoskeletal: Full range of motion in all 4 extremities. No joint swelling   Assessment/Plan: 1. Functional deficits which require 3+ hours per day of interdisciplinary therapy in a comprehensive inpatient rehab setting. Physiatrist is providing close team supervision and  24 hour management of active medical problems listed below. Physiatrist and rehab team continue to assess barriers to discharge/monitor patient progress toward functional and medical goals  Care Tool:  Bathing              Bathing assist       Upper Body Dressing/Undressing Upper body dressing        Upper body assist      Lower Body Dressing/Undressing Lower body dressing            Lower body assist       Toileting Toileting    Toileting assist       Transfers Chair/bed transfer  Transfers assist           Locomotion Ambulation   Ambulation assist              Walk 10 feet activity   Assist           Walk 50 feet activity   Assist           Walk 150 feet activity   Assist           Walk 10 feet on uneven surface  activity   Assist  Wheelchair     Assist               Wheelchair 50 feet with 2 turns activity    Assist            Wheelchair 150 feet activity     Assist          Blood pressure (!) 155/97, pulse 77, temperature 98.6 F (37 C), resp. rate 18, height 5' 6 (1.676 m), weight 59 kg, SpO2 99%.  Medical Problem List and Plan: 1. 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 Will order PRAFO to use at night 2.  Antithrombotics: -DVT/anticoagulation:  Pharmaceutical: Lovenox              -antiplatelet therapy: DAPT X 3 weeks followed by ASA alone.  3. Pain Management: Tylenol  prn.  4. Mood/Behavior/Sleep: LCSW to follow for evaluation and support.              -antipsychotic agents: N/A--agitation has resolved.  5. Neuropsych/cognition: This  patient is capable of making decisions on his own behalf. 6. Skin/Wound Care: Routine pressure relief measures.  7. Fluids/Electrolytes/Nutrition: Monitor I/O. Check CMET in am.  8.  HTN: Monitor BP TID and slowly  normalize over next few week per nephrology             --on Amlodipine  10, metoprolol  50 mg bid and hydralazine  100 mg every 8 hrs. Vitals:   02/25/24 2050 02/26/24 0605  BP: (!) 153/107 (!) 155/97  Pulse: 76 77  Resp: 18 18  Temp: 98.6 F (37 C) 98.6 F (37 C)  SpO2: 100% 99%  Still elevated    9. Dysphagia: D3, nectar (06/16) with head turn to left for all liquids. --Full supervision for safety and aspiration precautions.  10. 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 HIV disease: Continue BIKTARVY  50-200-25. Now followed by Dr. Melven Stable. Singh.  12. Anemia of chronic disease: Likely due to HIV/CKD. Will recheck in am.  13. Thrombocytopenia: Platelets 51 at admission. Now up to 147.  --Monitor for recovery 14. Hypocalcemia: Ionized calcium  1.08. Add supplement 15. Hyponatremia: Chronic?Baseline at 132-133.  --recheck in am.  16. Tobacco use: Nicotine  patch. Encourage nicotine  cessation.        LOS: 1 days A FACE TO FACE EVALUATION WAS PERFORMED  Genetta Kenning 02/26/2024, 8:54 AM

## 2024-02-27 LAB — RENAL FUNCTION PANEL
Albumin: 3.5 g/dL (ref 3.5–5.0)
Anion gap: 11 (ref 5–15)
BUN: 27 mg/dL — ABNORMAL HIGH (ref 6–20)
CO2: 25 mmol/L (ref 22–32)
Calcium: 9.5 mg/dL (ref 8.9–10.3)
Chloride: 100 mmol/L (ref 98–111)
Creatinine, Ser: 2.93 mg/dL — ABNORMAL HIGH (ref 0.61–1.24)
GFR, Estimated: 26 mL/min — ABNORMAL LOW (ref >60)
Glucose, Bld: 120 mg/dL — ABNORMAL HIGH (ref 70–99)
Phosphorus: 3.9 mg/dL (ref 2.5–4.6)
Potassium: 3.8 mmol/L (ref 3.5–5.1)
Sodium: 136 mmol/L (ref 135–145)

## 2024-02-27 MED ORDER — TRAZODONE HCL 50 MG PO TABS
50.0000 mg | ORAL_TABLET | Freq: Every day | ORAL | Status: DC
  Administered 2024-02-27 – 2024-03-17 (×19): 50 mg via ORAL
  Filled 2024-02-27 (×20): qty 1

## 2024-02-27 NOTE — Progress Notes (Signed)
 Occupational Therapy Session Note  Patient Details  Name: Juan Townsend MRN: [id number] Date of Birth: 07-Aug-1976  Today's Date: 02/27/2024 OT Individual Time: 1346-1500 OT Individual Time Calculation (min): 74 min    Short Term Goals: Week 1:  OT Short Term Goal 1 (Week 1): patient will don UB clothing with min A OT Short Term Goal 2 (Week 1): patient will don LB clothing with max A OT Short Term Goal 3 (Week 1): patient will complete funcitonal toilet and shower transfer with min A OT Short Term Goal 4 (Week 1): patient will demonstrate increased LUE incorperation as a stabilizer with min A  Skilled Therapeutic Interventions/Progress Updates:   Patient agreeable to participate in OT session. Reports no pain level. Patient completed toileting with maxA for clothing management and for peri hygiene. Patient able to wipe with set up however needed assist to stand for wipe.  Patient participated in skilled OT session focusing on neuromuscular re education of LUE and LLE. Therapist facilitated/elicited proper positioning for spine and strengthening in LUE and LLE utilizing weight shifting and weight bearing techniques to increase ability to utilize affected side for ADLs. Patient able to tolerate standing and weight shifting with  high table for modified quadruped position to facilitate function in UE. OT utilized blocking above quad for LE quad activation. Patient able to complete AAROM utilizing large therapy ball for increased ROM. OT returned patient to room/ bed with max A and met all verbalized needs. Alarm on.   Therapy Documentation Precautions:  Precautions Precautions: Fall Recall of Precautions/Restrictions: Impaired Precaution/Restrictions Comments: L hemipareisis, permissive HTN, HIV (+) Restrictions Weight Bearing Restrictions Per Provider Order: No  Pain:  0/10  Therapy/Group: Individual Therapy  D'mariea L Keziah Drotar 02/27/2024, 4:11 PM

## 2024-02-27 NOTE — Progress Notes (Signed)
 Speech Language Pathology Daily Session Note  Patient Details  Name: Juan Townsend MRN: [id number] Date of Birth: 20-May-1976  Today's Date: 02/27/2024 SLP Individual Time: 4098-1191 SLP Individual Time Calculation (min): 41 min  Short Term Goals: Week 1: SLP Short Term Goal 1 (Week 1): Patient will utilize swallowing compensatory strategies during consumption of D3/NTL diet given supervision multimodal A SLP Short Term Goal 2 (Week 1): Patient will demonstrate orientation to self, time, place, and situation given mod multimodal A SLP Short Term Goal 3 (Week 1): Patient will demonstrate basic problem solving skills given mod multimodal a SLP Short Term Goal 4 (Week 1): Patient will recall cognitive and physical changes since admission given mod multimodal A SLP Short Term Goal 5 (Week 1): Patient will recall daily activities given mod multimodal A  Skilled Therapeutic Interventions: SLP conducted skilled therapy session targeting cognitive and dysphagia goals. Patient participatory but flat throughout all tasks. SLP facilitated clock drawing task where patient benefited from mod assist for appropriate number spacing and sequencing. Then completed time indication task. Patient again benefited from mod assist to indicate appropriate numbers on the clock with the correct hand length to tell indicated time. During session, patient consumed NTLs utilizing L head turn strategy with min assist for consistency. Patient exhibited immediate cough x1. Recommend continuation of current diet. In final minutes of session, completed sustained attention/scanning task where he benefited from supervision to min assist for thorough scanning and switching cognitive set. Patient was left in room with call bell in reach and alarm set. SLP will continue to target goals per plan of care.        Pain Pain Assessment Pain Scale: 0-10 Pain Score: 0-No pain  Therapy/Group: Individual Therapy  [place] Segler, M.A.,  CCC-SLP  [place] [facility] A Jamir Rone 02/27/2024, 11:56 AM

## 2024-02-27 NOTE — Progress Notes (Signed)
 [facility] Individual Statement of Services  Patient Name:  Juan Townsend  Date:  02/27/2024  Welcome to the [facility].  Our goal is to provide you with an individualized program based on your diagnosis and situation, designed to meet your specific needs.  With this comprehensive rehabilitation program, you will be expected to participate in at least 3 hours of rehabilitation therapies Monday-Friday, with modified therapy programming on the weekends.  Your rehabilitation program will include the following services:  Physical Therapy (PT), Occupational Therapy (OT), Speech Therapy (ST), 24 hour per day rehabilitation nursing, Therapeutic Recreaction (TR), Neuropsychology, Care Coordinator, Rehabilitation Medicine, Nutrition Services, and Pharmacy Services  Weekly team conferences will be held on Wednesday to discuss your progress.  Your Inpatient Rehabilitation Care Coordinator will talk with you frequently to get your input and to update you on team discussions.  Team conferences with you and your family in attendance may also be held.  Expected length of stay: 21-25 days  Overall anticipated outcome: CGA-min level  Depending on your progress and recovery, your program may change. Your Inpatient Rehabilitation Care Coordinator will coordinate services and will keep you informed of any changes. Your Inpatient Rehabilitation Care Coordinator's name and contact numbers are listed  below.  The following services may also be recommended but are not provided by the [facility]:  Driving Evaluations Home Health Rehabiltiation Services Outpatient Rehabilitation Services Vocational Rehabilitation   Arrangements will be made to provide these services after discharge if needed.  Arrangements include referral to agencies that provide these services.  Your insurance has been verified to be:  uninsured-Medicaid pending Your primary doctor is:   None  Pertinent information will be shared with your doctor and your insurance company.  Inpatient Rehabilitation Care Coordinator:  Adrianna Albee, Buzz Cass [phone] or Justine Oms  Information discussed with and copy given to patient by: Mardell Shade, 02/27/2024, 9:28 AM
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
## 2024-02-27 NOTE — Progress Notes (Signed)
 Orthopedic Tech Progress Note Patient Details:  Juan Townsend 05-Mar-1976 [id number]  Patient ID: Juan Townsend, male   DOB: Jul 12, 1976, 48 y.o.   MRN: [id number] Called in [employer] for resting WHO brace.  Rayna Calkin 02/27/2024, 6:44 PM

## 2024-02-27 NOTE — Progress Notes (Signed)
 Physical Therapy Session Note  Patient Details  Name: Juan Townsend MRN: [id number] Date of Birth: 1976/08/22  Today's Date: 02/27/2024 PT Individual Time: 1020-1115 PT Individual Time Calculation (min): 55 min   Short Term Goals: Week 1:  PT Short Term Goal 1 (Week 1): Pt will perform bed mobility with overall consistent CGA. PT Short Term Goal 2 (Week 1): Pt will perform sit<>stand to no AD with MinA. PT Short Term Goal 3 (Week 1): Pt will perform stand pivot transfers with consistent ModA. PT Short Term Goal 4 (Week 1): pt will perform squat pivot transfers with consistent CGA. PT Short Term Goal 5 (Week 1): Pt will ambulate 50 ft with ModA from therapist and CGA from +2.  Skilled Therapeutic Interventions/Progress Updates:   Pt in bed to start - agreeable to PT tx and has no reports of pain. Patient with flat affect during treatment. Pt with urinary incontinence with soiled brief. Assisted with brief change with maxA for time management. Also assisted patient with LB/UB dressing into his personal clothes - needing maxA for both. Educated on hemi technique for dressing while assisting him. Supine<>sitting EOB with modA for trunk support and LLE management. Pt completed stand pivot transfer from EOB into wheelchair with modA with cues for hand placement and setup. Pt transported to main gym at wheelchair level. ModA for squat pivot transfer onto mat table towards his weaker R side. Focused remainder of session on sit<>stands, standing balance, LLLE NMR, sitting balance, and core activation. Pt requiring min/modA for sit<>stands using back of arm chair for his RUE to support himself. Standing balance statically requiring minA with PT facilitating quad and glut activation for full upright. Core work with modified sit ups, med ball reaches, sitting on compliant surface (air disc) without foot support. Pt needing min/modA for dynamic sitting balance. Delayed righting reactions and core activation to  facilitate corrections. Pt returned to her room and was left sitting up in wheelchair with needs met, seat belt alarm on, call bell in reach.    Therapy Documentation Precautions:  Precautions Precautions: Fall Recall of Precautions/Restrictions: Impaired Precaution/Restrictions Comments: L hemipareisis, permissive HTN, HIV (+) Restrictions Weight Bearing Restrictions Per Provider Order: No General:      Therapy/Group: Individual Therapy  Quinnten Calvin P Himani Corona 02/27/2024, 7:59 AM

## 2024-02-27 NOTE — Progress Notes (Addendum)
 PROGRESS NOTE   Subjective/Complaints: Was fatigued after therapy but enjoyed it yesterday , good appetite  ROS- no breathing issues , no pain   Objective:   No results found. Recent Labs    02/25/24 0440 02/26/24 0503  WBC 5.0 5.3  HGB 9.5* 9.7*  HCT 27.9* 28.5*  PLT 147* 224   Recent Labs    02/25/24 0440 02/26/24 0503  NA 132* 134*  K 3.6 3.7  CL 101 101  CO2 24 25  GLUCOSE 105* 94  BUN 24* 26*  CREATININE 2.76* 2.86*  CALCIUM  8.5* 8.9    Intake/Output Summary (Last 24 hours) at 02/27/2024 0754 Last data filed at 02/27/2024 0630 Gross per 24 hour  Intake 358 ml  Output 700 ml  Net -342 ml        Physical Exam: Vital Signs Blood pressure (!) 146/107, pulse 72, temperature 98 F (36.7 C), temperature source Oral, resp. rate 18, height 5' 6 (1.676 m), weight 57.4 kg, SpO2 100%.   General: No acute distress Mood and affect are appropriate Heart: Regular rate and rhythm no rubs murmurs or extra sounds Lungs: Clear to auscultation, breathing unlabored, no rales or wheezes Abdomen: Positive bowel sounds, soft nontender to palpation, nondistended Extremities: No clubbing, cyanosis, or edema Skin: No evidence of breakdown, no evidence of rash Neurologic: Cranial nerves II through XII intact, motor strength is 5/5 in RIght deltoid, bicep, tricep, grip, hip flexor, knee extensors, ankle dorsiflexor and plantar flexor\ 2- left delt, bi, tri, 0/5 grip, 3- knee ext 0/5 at foot and ankle Sensory exam normal sensation to light touch and proprioception in bilateral upper and reduced LT lower extremities Cerebellar exam cannot perform on left due to weakness  Musculoskeletal: Full range of motion in all 4 extremities. No joint swelling   Assessment/Plan: 1. Functional deficits which require 3+ hours per day of interdisciplinary therapy in a comprehensive inpatient rehab setting. Physiatrist is providing close  team supervision and 24 hour management of active medical problems listed below. Physiatrist and rehab team continue to assess barriers to discharge/monitor patient progress toward functional and medical goals  Care Tool:  Bathing    Body parts bathed by patient: Left arm, Chest, Abdomen, Front perineal area, Right upper leg, Left upper leg, Face   Body parts bathed by helper: Right arm, Buttocks, Right lower leg, Left lower leg     Bathing assist Assist Level: Moderate Assistance - Patient 50 - 74%     Upper Body Dressing/Undressing Upper body dressing   What is the patient wearing?: Pull over shirt    Upper body assist Assist Level: Moderate Assistance - Patient 50 - 74%    Lower Body Dressing/Undressing Lower body dressing      What is the patient wearing?: Incontinence brief, Pants     Lower body assist Assist for lower body dressing: 2 Helpers     Toileting Toileting    Toileting assist Assist for toileting: Total Assistance - Patient < 25%     Transfers Chair/bed transfer  Transfers assist     Chair/bed transfer assist level: Moderate Assistance - Patient 50 - 74%     Locomotion Ambulation   Ambulation  assist      Assist level: Maximal Assistance - Patient 25 - 49% Assistive device: Hand held assist (+2) Max distance: 30 ft   Walk 10 feet activity   Assist     Assist level: Maximal Assistance - Patient 25 - 49% Assistive device: Hand held assist (+2)   Walk 50 feet activity   Assist Walk 50 feet with 2 turns activity did not occur: Safety/medical concerns         Walk 150 feet activity   Assist Walk 150 feet activity did not occur: Safety/medical concerns         Walk 10 feet on uneven surface  activity   Assist Walk 10 feet on uneven surfaces activity did not occur: Safety/medical concerns         Wheelchair     Assist Is the patient using a wheelchair?: Yes Type of Wheelchair: Manual    Wheelchair assist  level: Dependent - Patient 0% Max wheelchair distance: 250 ft    Wheelchair 50 feet with 2 turns activity    Assist        Assist Level: Dependent - Patient 0%   Wheelchair 150 feet activity     Assist      Assist Level: Dependent - Patient 0%   Blood pressure (!) 146/107, pulse 72, temperature 98 F (36.7 C), temperature source Oral, resp. rate 18, height 5' 6 (1.676 m), weight 57.4 kg, SpO2 100%.  Medical Problem List and Plan: 1. 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 Will order PRAFO to use at night 2.  Antithrombotics: -DVT/anticoagulation:  Pharmaceutical: Lovenox              -antiplatelet therapy: DAPT X 3 weeks followed by ASA alone.  3. Pain Management: Tylenol  prn.  4. Mood/Behavior/Sleep: LCSW to follow for evaluation and support.              -antipsychotic agents: N/A--agitation has resolved.  5. Neuropsych/cognition: This patient is capable of making decisions on his own behalf. 6. Skin/Wound Care: Routine pressure relief measures.  7. Fluids/Electrolytes/Nutrition: Monitor I/O. Check CMET in am.  8.  HTN: Monitor BP TID and slowly  normalize over next few week per nephrology             --on Amlodipine  10, metoprolol  50 mg bid and hydralazine  100 mg every 8 hrs. Vitals:   02/27/24 0430 02/27/24 0606  BP: (!) 163/96 (!) 146/107  Pulse: 76 72  Resp: 18   Temp: 98 F (36.7 C)   SpO2: 100%   Still elevated nephro to address    9. Dysphagia: D3, nectar (06/16) with head turn to left for all liquids. --Full supervision for safety and aspiration precautions.  10. Acute on chronic kidney disease 3A: BUN/SCr 46/2.75-->20/2.18--->  24/2.74 11. HIV disease: Continue BIKTARVY  50-200-25. Now followed by Dr. Melven Stable. Singh.  12. Anemia of chronic disease: Likely due to HIV/CKD. Will recheck in am.  13. Thrombocytopenia: Platelets 51 at admission. Now up to 147.  --Monitor for recovery 14. Hypocalcemia: Ionized calcium   1.08. Add supplement 15. Hyponatremia: Chronic?Baseline at 132-133.  --recheck in am.  16. Tobacco use: Nicotine  patch. Encourage nicotine  cessation.        LOS: 2 days A FACE TO FACE EVALUATION WAS PERFORMED  Juan Townsend 02/27/2024, 7:54 AM

## 2024-02-27 NOTE — Plan of Care (Signed)
  Problem: RH Balance Goal: LTG Patient will maintain dynamic sitting balance (PT) Description: LTG:  Patient will maintain dynamic sitting balance with assistance during mobility activities (PT) Flowsheets (Taken 02/26/2024 1741) LTG: Pt will maintain dynamic sitting balance during mobility activities with:: Independent with assistive device  Goal: LTG Patient will maintain dynamic standing balance (PT) Description: LTG:  Patient will maintain dynamic standing balance with assistance during mobility activities (PT) Flowsheets (Taken 02/26/2024 1741) LTG: Pt will maintain dynamic standing balance during mobility activities with:: Contact Guard/Touching assist   Problem: Sit to Stand Goal: LTG:  Patient will perform sit to stand with assistance level (PT) Description: LTG:  Patient will perform sit to stand with assistance level (PT) Flowsheets (Taken 02/26/2024 1741) LTG: PT will perform sit to stand in preparation for functional mobility with assistance level: Supervision/Verbal cueing   Problem: RH Bed Mobility Goal: LTG Patient will perform bed mobility with assist (PT) Description: LTG: Patient will perform bed mobility with assistance, with/without cues (PT). Flowsheets (Taken 02/26/2024 1741) LTG: Pt will perform bed mobility with assistance level of: Independent with assistive device    Problem: RH Bed to Chair Transfers Goal: LTG Patient will perform bed/chair transfers w/assist (PT) Description: LTG: Patient will perform bed to chair transfers with assistance (PT). Flowsheets (Taken 02/26/2024 1741) LTG: Pt will perform Bed to Chair Transfers with assistance level: Supervision/Verbal cueing   Problem: RH Car Transfers Goal: LTG Patient will perform car transfers with assist (PT) Description: LTG: Patient will perform car transfers with assistance (PT). Flowsheets (Taken 02/26/2024 1741) LTG: Pt will perform car transfers with assist:: Supervision/Verbal cueing   Problem: RH  Furniture Transfers Goal: LTG Patient will perform furniture transfers w/assist (OT/PT) Description: LTG: Patient will perform furniture transfers  with assistance (OT/PT). Flowsheets (Taken 02/26/2024 1741) LTG: Pt will perform furniture transfers with assist:: Supervision/Verbal cueing   Problem: RH Ambulation Goal: LTG Patient will ambulate in controlled environment (PT) Description: LTG: Patient will ambulate in a controlled environment, # of feet with assistance (PT). Flowsheets (Taken 02/27/2024 0544) LTG: Pt will ambulate in controlled environ  assist needed:: Contact Guard/Touching assist LTG: Ambulation distance in controlled environment: at least 150 ft using LRAD Goal: LTG Patient will ambulate in home environment (PT) Description: LTG: Patient will ambulate in home environment, # of feet with assistance (PT). Flowsheets (Taken 02/26/2024 1741) LTG: Pt will ambulate in home environ  assist needed:: Minimal Assistance - Patient > 75% LTG: Ambulation distance in home environment: up to 50 ft per bout using LRAD   Problem: RH Wheelchair Mobility Goal: LTG Patient will propel w/c in home environment (PT) Description: LTG: Patient will propel wheelchair in home environment, # of feet with assistance (PT). Flowsheets (Taken 02/26/2024 1741) LTG: Pt will propel w/c in home environ  assist needed:: Independent with assistive device LTG: Propel w/c distance in home environment: up to 50 ft per bout   Problem: RH Stairs Goal: LTG Patient will ambulate up and down stairs w/assist (PT) Description: LTG: Patient will ambulate up and down # of stairs with assistance (PT) Flowsheets (Taken 02/26/2024 1741) LTG: Pt will ambulate up/down stairs assist needed:: Minimal Assistance - Patient > 75% LTG: Pt will  ambulate up and down number of stairs: at least 4 steps using HR setup as per home environment

## 2024-02-27 NOTE — IPOC Note (Signed)
 Overall Plan of Care [facility]) Patient Details Name: Juan Townsend MRN: [id number] DOB: 12-22-1975  Admitting Diagnosis: Acute right ACA stroke [facility])  Hospital Problems: Principal Problem:   Acute right ACA stroke (HCC)     Functional Problem List: Nursing Bowel, Safety, Medication Management, Endurance, Nutrition  PT Balance, Endurance, Motor, Perception, Safety, Sensory  OT Motor, Endurance, Cognition, Balance, Perception, Safety, Skin Integrity  SLP Cognition, Nutrition  TR         Basic ADL's: OT Toileting, Dressing, Bathing, Grooming     Advanced  ADL's: OT       Transfers: PT Bed Mobility, Bed to Chair, Car, Lobbyist, Technical brewer: PT Ambulation, Psychologist, prison and probation services, Stairs     Additional Impairments: OT Fuctional Use of Upper Extremity  SLP Swallowing, Social Cognition   Problem Solving, Memory, Awareness  TR      Anticipated Outcomes Item Anticipated Outcome  Self Feeding IND  Swallowing  mod i   Basic self-care  SUP  Toileting  sup   Bathroom Transfers sup  Bowel/Bladder  manage bowel w mod I assist  Transfers  CGA/ supervision  Locomotion  MinA/ CGA  Communication     Cognition  minA  Pain  n/a  Safety/Judgment  manage safety w cues   Therapy Plan: PT Intensity: Minimum of 1-2 x/day ,45 to 90 minutes PT Frequency: 5 out of 7 days PT Duration Estimated Length of Stay: 21-25 days OT Intensity: Minimum of 1-2 x/day, 45 to 90 minutes OT Frequency: 5 out of 7 days OT Duration/Estimated Length of Stay: 18 days SLP Intensity: Minumum of 1-2 x/day, 30 to 90 minutes SLP Frequency: 3 to 5 out of 7 days SLP Duration/Estimated Length of Stay: 10-14 days   Team Interventions: Nursing Interventions Patient/Family Education, Medication Management, Bowel Management, Disease Management/Prevention, Discharge Planning, Dysphagia/Aspiration Precaution Training  PT interventions Ambulation/gait training, Cognitive  remediation/compensation, Discharge planning, DME/adaptive equipment instruction, Functional mobility training, Psychosocial support, Splinting/orthotics, UE/LE Strength taining/ROM, Therapeutic Activities, Warden/ranger, Community reintegration, Disease management/prevention, Functional electrical stimulation, Neuromuscular re-education, Patient/family education, Skin care/wound management, Stair training, Therapeutic Exercise, UE/LE Coordination activities, Wheelchair propulsion/positioning  OT Interventions Warden/ranger, Discharge planning, Self Care/advanced ADL retraining, Therapeutic Activities, UE/LE Coordination activities, Functional electrical stimulation, Therapeutic Exercise, UE/LE Strength taining/ROM, Splinting/orthotics, Neuromuscular re-education, Psychosocial support, DME/adaptive equipment instruction, Functional mobility training, Patient/family education, Cognitive remediation/compensation, Wheelchair propulsion/positioning, Visual/perceptual remediation/compensation, Disease mangement/prevention, Community reintegration  SLP Interventions Cognitive remediation/compensation, Financial trader, Dysphagia/aspiration precaution training, Functional tasks, Internal/external aids, Oral motor exercises, Patient/family education, Therapeutic Activities  TR Interventions    SW/CM Interventions     Barriers to Discharge MD  Medical stability  Nursing Decreased caregiver support 1 level 3 ste no rail w brother who works 3rd shift but will arrange help when he is at work  PT [employer] home environment, Decreased caregiver support, Lack of/limited family support, Community education officer for SNF coverage    OT Decreased caregiver support    SLP      SW       Team Discharge Planning: Destination: PT-Home ,OT- Home , SLP-Home Projected Follow-up: PT-Home health PT, Outpatient PT, 24 hour supervision/assistance, OT-  Outpatient OT, SLP-Home Health SLP, Outpatient SLP, 24  hour supervision/assistance Projected Equipment Needs: PT-To be determined, OT- To be determined, SLP-None recommended by SLP Equipment Details: PT- , OT-  Patient/family involved in discharge planning: PT- Patient,  OT-Patient, SLP-Patient  MD ELOS: 21-24d Medical Rehab Prognosis:  Good Assessment: The patient has been admitted for CIR  therapies with the diagnosis of CVA. The team will be addressing functional mobility, strength, stamina, balance, safety, adaptive techniques and equipment, self-care, bowel and bladder mgt, patient and caregiver education, monitor and manage AKI, hypertension. Goals have been set at Min A. Anticipated discharge destination is Home with family assist.        See Team Conference Notes for weekly updates to the plan of care

## 2024-02-27 NOTE — Progress Notes (Signed)
 Nephrology Follow-Up Consult note   Assessment/Recommendations: Juan Townsend is a/an 48 y.o. male with a past medical history significant for hypertension and HIV, admitted for CVA complicated by AKI.       Non-Oliguric AKI on possible CKD: Baseline creatinine hard to define definitively.  Could be between 1 and 1.6 or even higher.  Creatinine elevated here and slowly rising.  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  Other forms of TMA are also possible but seem less likely because the thrombocytopenia improved quickly with controlling the blood pressure.  This would also fit with his preponderance for CVA.  It is likely that the creatinine will improve with time - Continue to monitor labs daily - Will send genetic studies for atypical HUS to be on the safe side - Leave blood pressure where it is for now but then improved control with time his creatinine stabilizes   Thrombocytopenia: Present on arrival.  Improved with conservative management.  Likely related to hypertension but sending atypical HUS genetic studies as well.  Consider further workup if it returns  Hypertension: Blood pressure overall improved.  Continue current medications then improved control as creatinine stabilizes.  CVA: Continue management per primary team  HIV: Home medications per primary team    Recommendations conveyed to primary service.    Levorn Reason [facility] 02/27/2024 10:13 AM  ___________________________________________________________  CC: Stroke  Interval History/Subjective: Patient lying in bed with no complaints   Medications:  Current Facility-Administered Medications  Medication Dose Route Frequency Provider Last Rate Last Admin   acetaminophen  (TYLENOL ) tablet 325-650 mg  325-650 mg Oral Q4H PRN Love, Pamela S, PA-C       alum & mag hydroxide-simeth (MAALOX/MYLANTA) 200-200-20 MG/5ML suspension 30 mL  30 mL Oral  Q4H PRN Love, Pamela S, PA-C       amLODipine  (NORVASC ) tablet 10 mg  10 mg Oral Daily Love, Pamela S, PA-C   10 mg at 02/27/24 0915   aspirin  EC tablet 81 mg  81 mg Oral Daily Love, Pamela S, PA-C   81 mg at 02/27/24 0915   atorvastatin  (LIPITOR) tablet 20 mg  20 mg Oral Q supper Love, Pamela S, PA-C   20 mg at 02/26/24 1712   bictegravir-emtricitabine -tenofovir  AF (BIKTARVY ) 50-200-25 MG per tablet 1 tablet  1 tablet Oral Daily Zelda Hickman, PA-C   1 tablet at 02/27/24 0915   bisacodyl (DULCOLAX) suppository 10 mg  10 mg Rectal Daily PRN Love, Pamela S, PA-C       calcium  carbonate (OS-CAL - dosed in mg of elemental calcium ) tablet 1,250 mg  1 tablet Oral Q lunch Love, Pamela S, PA-C   1,250 mg at 02/26/24 1244   clopidogrel  (PLAVIX ) tablet 75 mg  75 mg Oral Daily Love, Pamela S, PA-C   75 mg at 02/27/24 0915   diphenhydrAMINE (BENADRYL) capsule 25 mg  25 mg Oral Q6H PRN Love, Pamela S, PA-C       docusate sodium  (COLACE) capsule 100 mg  100 mg Oral BID PRN Love, Pamela S, PA-C       enoxaparin  (LOVENOX ) injection 30 mg  30 mg Subcutaneous Q24H Love, Pamela S, PA-C   30 mg at 02/26/24 2014   guaiFENesin-dextromethorphan (ROBITUSSIN DM) 100-10 MG/5ML syrup 5-10 mL  5-10 mL Oral Q6H PRN Love, Pamela S, PA-C       hydrALAZINE  (APRESOLINE ) tablet 100 mg  100 mg Oral Q8H Love, Pamela S, PA-C  100 mg at 02/27/24 0606   lidocaine (XYLOCAINE) 2 % jelly   Topical PRN Zelda Hickman, PA-C       metoprolol  tartrate (LOPRESSOR ) tablet 50 mg  50 mg Oral BID Love, Pamela S, PA-C   50 mg at 02/27/24 0915   nicotine  (NICODERM CQ  - dosed in mg/24 hours) patch 21 mg  21 mg Transdermal Daily Zelda Hickman, PA-C   21 mg at 02/27/24 0915   Oral care mouth rinse  15 mL Mouth Rinse 4 times per day Zelda Hickman, PA-C   15 mL at 02/27/24 3875   pantoprazole  (PROTONIX ) EC tablet 40 mg  40 mg Oral Daily Zelda Hickman, PA-C   40 mg at 02/27/24 0915   polyethylene glycol (MIRALAX  / GLYCOLAX ) packet 17 g  17 g Oral  Daily PRN Jean Michaelis S, PA-C       prochlorperazine (COMPAZINE) tablet 5-10 mg  5-10 mg Oral Q6H PRN Love, Pamela S, PA-C       Or   prochlorperazine (COMPAZINE) suppository 12.5 mg  12.5 mg Rectal Q6H PRN Love, Pamela S, PA-C       Or   prochlorperazine (COMPAZINE) injection 5-10 mg  5-10 mg Intravenous Q6H PRN Love, Pamela S, PA-C       sodium phosphate (FLEET) enema 1 enema  1 enema Rectal Once PRN Love, Pamela S, PA-C       traZODone (DESYREL) tablet 25-50 mg  25-50 mg Oral QHS PRN Zelda Hickman, PA-C          Review of Systems: 10 systems reviewed and negative except per interval history/subjective  Physical Exam: Vitals:   02/27/24 0430 02/27/24 0606  BP: (!) 163/96 (!) 146/107  Pulse: 76 72  Resp: 18   Temp: 98 F (36.7 C)   SpO2: 100%    Total I/O In: 236 [P.O.:236] Out: -   Intake/Output Summary (Last 24 hours) at 02/27/2024 1013 Last data filed at 02/27/2024 6433 Gross per 24 hour  Intake 354 ml  Output 700 ml  Net -346 ml   Constitutional: Chronically ill-appearing, lying in bed, no distress ENMT: ears and nose without scars or lesions, MMM CV: normal rate, no edema Respiratory: Bilateral chest rise, normal work of breathing Gastrointestinal: soft, non-tender, no palpable masses or hernias Skin: no visible lesions or rashes   Test Results I personally reviewed new and old clinical labs and radiology tests Lab Results  Component Value Date   NA 134 (L) 02/26/2024   K 3.7 02/26/2024   CL 101 02/26/2024   CO2 25 02/26/2024   BUN 26 (H) 02/26/2024   CREATININE 2.86 (H) 02/26/2024   CALCIUM  8.9 02/26/2024   ALBUMIN 3.3 (L) 02/26/2024   PHOS 4.4 02/18/2024    CBC Recent Labs  Lab 02/21/24 0756 02/25/24 0440 02/26/24 0503  WBC 4.4 5.0 5.3  NEUTROABS  --   --  3.1  HGB 9.9* 9.5* 9.7*  HCT 28.3* 27.9* 28.5*  MCV 88.4 89.4 89.9  PLT 122* 147* 224

## 2024-02-27 NOTE — Progress Notes (Signed)
 Physical Therapy Session Note  Patient Details  Name: Juan Townsend MRN: [id number] Date of Birth: 11-05-1975  Today's Date: 02/26/2024 PT Individual Time: 4098-1191 PT Individual Time Calculation (min): 29 min  Short Term Goals: Week 1:  PT Short Term Goal 1 (Week 1): Pt will perform bed mobility with overall consistent CGA. PT Short Term Goal 2 (Week 1): Pt will perform sit<>stand to no AD with MinA. PT Short Term Goal 3 (Week 1): Pt will perform stand pivot transfers with consistent ModA. PT Short Term Goal 4 (Week 1): pt will perform squat pivot transfers with consistent CGA. PT Short Term Goal 5 (Week 1): Pt will ambulate 50 ft with ModA from therapist and CGA from +2.  Skilled Therapeutic Interventions/Progress Updates:  Patient sidelying to L side on entrance to room. Patient alert and agreeable to PT session.   Patient with no pain complaint at start of session.  Therapeutic Activity/ NMR: Bed Mobility: Pt performed supine <> sit with Min/ modA. VC/ tc required for increased effort and positioning L elbow to bed for prop to assist RUE in improved leverage to complete push to upright seated position. MinA to complete lateral scoot to R side along EOB. Reminded pt re: need for forward lean to increase anterior weight shift over feet to unweight bottom for more effective scoot. With cueing, pt is able to improve technique but continues to require MinA to reach position.  Transfers: Pt taken to ortho gym dependently via w/ c for practice in car transfer. Pt relates family with cars similar to small SUV. Car simulator placed at appropriate height. Guided pt with verbal instructions and visual demonstration prior to attempt. Requires ModA initially to complete squat pivot with Mod/ MaxA for positioning setupfor L hemibody. Difficulty with use of R hemibody for push toward L side and decreased motor control and coordination. ModA to complete into car with phsyical assist provided to LLE into  footwell of car. Required vc for increased awwareness of LLE positioning as pt attempts to bring RLE into car without completing bringing LLE fully into footwell. ModA provided to bring LLE out of car to floor. Pt with improved transfer back to w/c when performing squat pivot to R side. Used already learned technique of reaching across w/c to far armrest and using R hemibody to shift hips/ pelvis to R and into seat.   NMR performed for improvements in motor control and coordination, balance, sequencing, judgement, and self confidence/ efficacy in performing all aspects of mobility at highest level of independence.   Patient supine in bed at end of session with brakes locked, bed alarm set, and all needs within reach.   Therapy Documentation Precautions:  Precautions Precautions: Fall Recall of Precautions/Restrictions: Impaired Precaution/Restrictions Comments: L hemipareisis, permissive HTN, HIV (+) Restrictions Weight Bearing Restrictions Per Provider Order: No  Pain: Pain Assessment Pain Scale: 0-10 Pain Score: 0-No pain related this session.    Therapy/Group: Individual Therapy  Donne Gage PT, DPT, CSRS 02/26/2024, 6:47 PM

## 2024-02-27 NOTE — Progress Notes (Signed)
 Inpatient Rehabilitation Care Coordinator Assessment and Plan Patient Details  Name: Juan Townsend MRN: [id number] Date of Birth: Oct 03, 1975  Today's Date: 02/27/2024  Hospital Problems: Principal Problem:   Acute right ACA stroke [facility])  Past Medical History:  Past Medical History:  Diagnosis Date   HIV (human immunodeficiency virus infection) (HCC)    Hypercholesteremia    Hypertension    Stroke [facility])    Past Surgical History: History reviewed. No pertinent surgical history. Social History:  reports that he has been smoking cigarettes. He does not have any smokeless tobacco history on file. He reports current alcohol use of about 1.0 standard drink of alcohol per week. He reports current drug use. Drug: Marijuana.  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 Pt was working prior to admission and take care of himself.  Social History Preferred language: English Religion: Non-Denominational Cultural Background: NA Education: HS Health Literacy - How often do you need to have someone help you when you read instructions, pamphlets, or other written material from your doctor or pharmacy?: Never Writes: Yes Employment Status: Employed Name of Employer: [employer] Return to Work Plans: unsure if can would need to recover Legal History/Current Legal Issues: NA Guardian/Conservator: None-according to MD pt is capable of making his own decisions while here   Abuse/Neglect Abuse/Neglect Assessment Can Be Completed:  Yes Physical Abuse: Denies Verbal Abuse: Denies Sexual Abuse: Denies Exploitation of patient/patient's resources: Denies Self-Neglect: Denies  Patient response to: Social Isolation - How often do you feel lonely or isolated from those around you?: Rarely  Emotional Status Pt's affect, behavior and adjustment status: Pt has always been able to take care of himself until this. He has other health issues but has managed and still worked. He hopes to recover and be able to take care of himself. Recent Psychosocial Issues: other health issues needs a PCP Psychiatric History: No history with all o fhis health issues would benefit from seeing neuro-psych while here Substance Abuse History: Tobacco and THC aware of the health risks with both and plans to quit now  Patient / Family Perceptions, Expectations & Goals Pt/Family understanding of illness & functional limitations: Pt can explain his stroke and deficits, he does talk with the MD and feels he understands his plan moving forward. He is hopeful he will do well here. Premorbid pt/family roles/activities: employee, brother, friend Anticipated changes in roles/activities/participation: resume Pt/family expectations/goals: Pt states:  I want to take care of myself when I leave.  Community Resources [employer]: None Premorbid Home Care/DME Agencies: None Transportation available at discharge: self Is the patient able to respond to transportation needs?: Yes In the past 12 months, has lack of transportation kept you from medical appointments or from getting medications?: No In the past 12 months, has lack of transportation kept you from meetings, work, or from getting things needed for daily living?: No Resource referrals recommended: Neuropsychology  Discharge Planning Living Arrangements: Non-relatives/Friends Support Systems: Other relatives, Friends/neighbors Type of Residence: Private residence Insurance Resources: Self-pay  (Medicaid pending) Financial Resources: Employment Financial Screen Referred: Yes Living Expenses: Rent Money Management: Patient Does the  patient have any problems obtaining your medications?: Yes (Describe) (uninsured) Home Management: self Patient/Family Preliminary Plans: Plan to go to brother's at discharge and hopefully between he and his wife they can provide 24/7. Have yet to talk with brother have left two voice mails. He and wife both work third shift so may be more difficult to reach. Pt reports better to call in the afternoon due to sleeps in the am. Care Coordinator Barriers to Discharge: Insurance for SNF coverage, Medication compliance, Lack of/limited family support Care Coordinator Anticipated Follow Up Needs: HH/OP  Clinical Impression Pleasant quiet gentleman who needs added time to answer questions and process. Will continue to try to reach brother to confirm plan. Pt will need assist at discharge. Will work on getting PCP and medicaid for him. Placed on neuro-psych list to be seen   Mardell Shade 02/27/2024, 12:48 PM

## 2024-02-28 LAB — RENAL FUNCTION PANEL
Albumin: 3.3 g/dL — ABNORMAL LOW (ref 3.5–5.0)
Anion gap: 12 (ref 5–15)
BUN: 30 mg/dL — ABNORMAL HIGH (ref 6–20)
CO2: 24 mmol/L (ref 22–32)
Calcium: 9 mg/dL (ref 8.9–10.3)
Chloride: 101 mmol/L (ref 98–111)
Creatinine, Ser: 3.19 mg/dL — ABNORMAL HIGH (ref 0.61–1.24)
GFR, Estimated: 23 mL/min — ABNORMAL LOW (ref >60)
Glucose, Bld: 149 mg/dL — ABNORMAL HIGH (ref 70–99)
Phosphorus: 4 mg/dL (ref 2.5–4.6)
Potassium: 3.5 mmol/L (ref 3.5–5.1)
Sodium: 137 mmol/L (ref 135–145)

## 2024-02-28 NOTE — Progress Notes (Signed)
 Physical Therapy Session Note  Patient Details  Name: Juan Townsend MRN: [id number] Date of Birth: [birth year]  Today's Date: 02/28/2024 PT Individual Time: 0916-1002 PT Individual Time Calculation (min): 46 min   Short Term Goals: Week 1:  PT Short Term Goal 1 (Week 1): Pt will perform bed mobility with overall consistent CGA. PT Short Term Goal 2 (Week 1): Pt will perform sit<>stand to no AD with MinA. PT Short Term Goal 3 (Week 1): Pt will perform stand pivot transfers with consistent ModA. PT Short Term Goal 4 (Week 1): pt will perform squat pivot transfers with consistent CGA. PT Short Term Goal 5 (Week 1): Pt will ambulate 50 ft with ModA from therapist and CGA from +2.  Skilled Therapeutic Interventions/Progress Updates:  Patient supine in bed on entrance to room. Patient alert and agreeable to PT session.   Patient with no pain complaint at start of session.  Therapeutic Activity: Bed Mobility: Pt performed supine > sit with overall MinA d/t attempt to sit up with compensatory trunk movements but ends up loses balance backwards. Reminded pt re: need to lean forward in order to unweight to scoot forward.  VC/ tc required for technique. Transfers: Pt performed sit<>stand to RW from bed with Min/ ModA. Ambulatory transfer to toilet using RW with ModA. Toilet transfer with ModA for clothing mgmt and MinA for transfer with vc/ tc for sequencing pivot stepping.   Gait Training:  Pt ambulated from bed into hallway, performed 180* turn and returned into room. Covers 40 ft using HHA from +2 with overall ModA for balance. Provided pt with NDT cueing at trunk for improved lateral weight shift to L side as well as L trunk shortening (QL and lower trap as well as lower thoracic facilitation) and lengthening of R trunk.  Demonstrated good initiation at L hip for step progression. With fatigue, decreased step through with RLE and decreased step length with LLE. VC provided for increasing RLE step  length with downward cue above knee to improve  extension during stance phase and then upward cue just distal to knee to initiate knee flexion for swing phase.   Patient supine in bed at end of session with brakes locked, bed alarm set, and all needs within reach.   Therapy Documentation Precautions:  Precautions Precautions: Fall Recall of Precautions/Restrictions: Impaired Precaution/Restrictions Comments: L hemipareisis, permissive HTN, HIV (+) Restrictions Weight Bearing Restrictions Per Provider Order: No  Pain:  No pain related this session.   Therapy/Group: Individual Therapy  [name] PT, DPT, CSRS 02/28/2024, 9:43 AM

## 2024-02-28 NOTE — Progress Notes (Signed)
 PROGRESS NOTE   Subjective/Complaints: Appreciate nephro note  ROS- no breathing issues , no pain   Objective:   No results found. Recent Labs    02/26/24 0503  WBC 5.3  HGB 9.7*  HCT 28.5*  PLT 224   Recent Labs    02/26/24 0503 02/27/24 1038  NA 134* 136  K 3.7 3.8  CL 101 100  CO2 25 25  GLUCOSE 94 120*  BUN 26* 27*  CREATININE 2.86* 2.93*  CALCIUM  8.9 9.5    Intake/Output Summary (Last 24 hours) at 02/28/2024 0830 Last data filed at 02/28/2024 0753 Gross per 24 hour  Intake 598 ml  Output 1100 ml  Net -502 ml        Physical Exam: Vital Signs Blood pressure 133/85, pulse 68, temperature 98 F (36.7 C), temperature source Oral, resp. rate 16, height 5' 6 (1.676 m), weight 57.4 kg, SpO2 100%.   General: No acute distress Mood and affect are appropriate Heart: Regular rate and rhythm no rubs murmurs or extra sounds Lungs: Clear to auscultation, breathing unlabored, no rales or wheezes Abdomen: Positive bowel sounds, soft nontender to palpation, nondistended Extremities: No clubbing, cyanosis, or edema Skin: No evidence of breakdown, no evidence of rash Neurologic: Cranial nerves II through XII intact, motor strength is 5/5 in RIght deltoid, bicep, tricep, grip, hip flexor, knee extensors, ankle dorsiflexor and plantar flexor\ 2- left delt, bi, tri, 0/5 grip, 3- knee ext 0/5 at foot and ankle Sensory exam normal sensation to light touch and proprioception in bilateral upper and reduced LT lower extremities Cerebellar exam cannot perform on left due to weakness  Musculoskeletal: Full range of motion in all 4 extremities. No joint swelling   Assessment/Plan: 1. Functional deficits which require 3+ hours per day of interdisciplinary therapy in a comprehensive inpatient rehab setting. Physiatrist is providing close team supervision and 24 hour management of active medical problems listed  below. Physiatrist and rehab team continue to assess barriers to discharge/monitor patient progress toward functional and medical goals  Care Tool:  Bathing    Body parts bathed by patient: Left arm, Chest, Abdomen, Front perineal area, Right upper leg, Left upper leg, Face   Body parts bathed by helper: Right arm, Buttocks, Right lower leg, Left lower leg     Bathing assist Assist Level: Moderate Assistance - Patient 50 - 74%     Upper Body Dressing/Undressing Upper body dressing   What is the patient wearing?: Pull over shirt    Upper body assist Assist Level: Moderate Assistance - Patient 50 - 74%    Lower Body Dressing/Undressing Lower body dressing      What is the patient wearing?: Incontinence brief, Pants     Lower body assist Assist for lower body dressing: 2 Helpers     Toileting Toileting    Toileting assist Assist for toileting: Total Assistance - Patient < 25%     Transfers Chair/bed transfer  Transfers assist  Chair/bed transfer activity did not occur: Safety/medical concerns  Chair/bed transfer assist level: Moderate Assistance - Patient 50 - 74%     Locomotion Ambulation   Ambulation assist      Assist level: Maximal  Assistance - Patient 25 - 49% Assistive device: Hand held assist (+2) Max distance: 30 ft   Walk 10 feet activity   Assist     Assist level: Maximal Assistance - Patient 25 - 49% Assistive device: Hand held assist (+2)   Walk 50 feet activity   Assist Walk 50 feet with 2 turns activity did not occur: Safety/medical concerns         Walk 150 feet activity   Assist Walk 150 feet activity did not occur: Safety/medical concerns         Walk 10 feet on uneven surface  activity   Assist Walk 10 feet on uneven surfaces activity did not occur: Safety/medical concerns         Wheelchair     Assist Is the patient using a wheelchair?: Yes Type of Wheelchair: Manual    Wheelchair assist level:  Dependent - Patient 0% Max wheelchair distance: 250 ft    Wheelchair 50 feet with 2 turns activity    Assist        Assist Level: Dependent - Patient 0%   Wheelchair 150 feet activity     Assist      Assist Level: Dependent - Patient 0%   Blood pressure 133/85, pulse 68, temperature 98 F (36.7 C), temperature source Oral, resp. rate 16, height 5' 6 (1.676 m), weight 57.4 kg, SpO2 100%.  Medical Problem List and Plan: 1. 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 Will order PRAFO to use at night 2.  Antithrombotics: -DVT/anticoagulation:  Pharmaceutical: Lovenox              -antiplatelet therapy: DAPT X 3 weeks followed by ASA alone.  3. Pain Management: Tylenol  prn.  4. Mood/Behavior/Sleep: LCSW to follow for evaluation and support.              -antipsychotic agents: N/A--agitation has resolved.  5. Neuropsych/cognition: This patient is capable of making decisions on his own behalf. 6. Skin/Wound Care: Routine pressure relief measures.  7. Fluids/Electrolytes/Nutrition: Monitor I/O. Check CMET in am.  8.  HTN: Monitor BP TID and slowly  normalize over next few week per nephrology             --on Amlodipine  10, metoprolol  50 mg bid and hydralazine  100 mg every 8 hrs. Vitals:   02/27/24 2116 02/28/24 0511  BP: (!) 166/102 133/85  Pulse: 86 68  Resp:  16  Temp:  98 F (36.7 C)  SpO2:  100%  Still elevated nephro to address    9. Dysphagia: D3, nectar (06/16) with head turn to left for all liquids. --Full supervision for safety and aspiration precautions.  10. 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 HIV  disease: Continue BIKTARVY  50-200-25. Now followed by Dr. Melven Stable. Singh.  12. Anemia of chronic disease: Likely due to HIV/CKD. Will recheck in am.  13. Thrombocytopenia: Platelets 51 at admission. Now up to 147.  --Monitor for recovery 14. Hypocalcemia: Ionized calcium  1.08. Add supplement 15. Hyponatremia: Chronic?Baseline at 132-133.  --recheck in  am.  16. Tobacco use: Nicotine  patch. Encourage nicotine  cessation.        LOS: 3 days A FACE TO FACE EVALUATION WAS PERFORMED  Genetta Kenning 02/28/2024, 8:30 AM

## 2024-02-28 NOTE — Progress Notes (Incomplete)
 Physical Therapy Session Note  Patient Details  Name: Juan Townsend MRN: [id number] Date of Birth: 12-14-1975  Today's Date: 02/28/2024 PT Individual Time: 7829-5621 PT Individual Time Calculation (min): 43 min   Short Term Goals: Week 1:  PT Short Term Goal 1 (Week 1): Pt will perform bed mobility with overall consistent CGA. PT Short Term Goal 2 (Week 1): Pt will perform sit<>stand to no AD with MinA. PT Short Term Goal 3 (Week 1): Pt will perform stand pivot transfers with consistent ModA. PT Short Term Goal 4 (Week 1): pt will perform squat pivot transfers with consistent CGA. PT Short Term Goal 5 (Week 1): Pt will ambulate 50 ft with ModA from therapist and CGA from +2.  Skilled Therapeutic Interventions/Progress Updates:  Patient *** on entrance to room. Patient alert and agreeable to PT session.   Patient with no pain complaint at start of session.  Therapeutic Activity: Bed Mobility: Pt performed supine <> sit with ***. VC/ tc required for ***. Transfers: Pt performed sit<>stand and stand pivot transfers throughout session with ***. Provided vc/ tc for***.  Gait Training:  Pt ambulated *** ft using *** with ***. Demonstrated ***. Provided vc/ tc for ***.  Wheelchair Mobility:  Pt propelled wheelchair *** feet with ***. Provided vc/ tc for ***.  Neuromuscular Re-ed: NMR facilitated during session with focus on ***. Pt guided in ***. NMR performed for improvements in motor control and coordination, balance, sequencing, judgement, and self confidence/ efficacy in performing all aspects of mobility at highest level of independence.   Therapeutic Exercise: Pt performed the following exercises with vc/ tc for proper technique. ***  Patient *** at end of session with brakes locked, *** alarm set, and all needs within reach.   Therapy Documentation Precautions:  Precautions Precautions: Fall Recall of Precautions/Restrictions: Impaired Precaution/Restrictions Comments: L  hemipareisis, permissive HTN, HIV (+) Restrictions Weight Bearing Restrictions Per Provider Order: No  Pain:    Therapy/Group: Individual Therapy  Donne Gage PT, DPT, CSRS 02/28/2024, 6:37 PM

## 2024-02-28 NOTE — Progress Notes (Signed)
 Nephrology Follow-Up Consult note   Assessment/Recommendations: Juan Townsend is a/an 48 y.o. male with  hypertension and HIV, admitted for CVA complicated by AKI.      Non-Oliguric AKI on possible CKD: Baseline creatinine hard to define.  Could be between 1 and 1.6 or even higher.  Creatinine elevated here and slowly rising.  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  Other forms of TMA are also possible but seem less likely because the thrombocytopenia improved quickly with controlling the blood pressure.  This would also fit with preponderance for CVA.  It is likely that the creatinine will improve with time, has not happened yet however-  urine pretty bland-   us  does show smaller left kidney but normal echogen  - Will send genetic studies for atypical HUS to be on the safe side - Leave blood pressure where it is for now but then improved control with time his creatinine stabilizes  No compelling need for HD-  awaiting plateau     Thrombocytopenia: Present on arrival.  Improved with conservative management.  Likely related to hypertension but sending atypical HUS genetic studies as well.  Consider further workup if it returns  Hypertension: Blood pressure overall improved.  Continue current medications amlodipine  10, hydral 100 and metoprolol  50   CVA: Continue management per primary team  HIV: Home medications per primary team    [name] 02/28/2024 12:42 PM  ___________________________________________________________  Interval History/Subjective:   no c/o's-   1100 UOP-  crt rising still-  does not seem uremic    Medications:  Current Facility-Administered Medications  Medication Dose Route Frequency Provider Last Rate Last Admin   acetaminophen  (TYLENOL ) tablet 325-650 mg  325-650 mg Oral Q4H PRN Love, Pamela S, PA-C   650 mg at 02/27/24 2117   alum & mag hydroxide-simeth  (MAALOX/MYLANTA) 200-200-20 MG/5ML suspension 30 mL  30 mL Oral Q4H PRN Love, Pamela S, PA-C       amLODipine  (NORVASC ) tablet 10 mg  10 mg Oral Daily Love, Pamela S, PA-C   10 mg at 02/28/24 1610   aspirin  EC tablet 81 mg  81 mg Oral Daily Love, Pamela S, PA-C   81 mg at 02/28/24 0920   atorvastatin  (LIPITOR) tablet 20 mg  20 mg Oral Q supper Love, Pamela S, PA-C   20 mg at 02/27/24 9604   bictegravir-emtricitabine -tenofovir  AF (BIKTARVY ) 50-200-25 MG per tablet 1 tablet  1 tablet Oral Daily Zelda Hickman, PA-C   1 tablet at 02/28/24 0920   bisacodyl (DULCOLAX) suppository 10 mg  10 mg Rectal Daily PRN Love, Pamela S, PA-C       calcium  carbonate (OS-CAL - dosed in mg of elemental calcium ) tablet 1,250 mg  1 tablet Oral Q lunch Love, Pamela S, PA-C   1,250 mg at 02/28/24 1219   clopidogrel  (PLAVIX ) tablet 75 mg  75 mg Oral Daily Love, Pamela S, PA-C   75 mg at 02/28/24 0921   diphenhydrAMINE (BENADRYL) capsule 25 mg  25 mg Oral Q6H PRN Love, Pamela S, PA-C       docusate sodium  (COLACE) capsule 100 mg  100 mg Oral BID PRN Love, Pamela S, PA-C   100 mg at 02/28/24 5409   enoxaparin  (LOVENOX ) injection 30 mg  30 mg Subcutaneous Q24H Love, Pamela S, PA-C   30 mg at 02/27/24 2117   guaiFENesin-dextromethorphan (ROBITUSSIN DM) 100-10 MG/5ML syrup 5-10 mL  5-10 mL Oral Q6H  PRN Zelda Hickman, PA-C       hydrALAZINE  (APRESOLINE ) tablet 100 mg  100 mg Oral Q8H Love, Pamela S, PA-C   100 mg at 02/28/24 0511   lidocaine (XYLOCAINE) 2 % jelly   Topical PRN Zelda Hickman, PA-C       metoprolol  tartrate (LOPRESSOR ) tablet 50 mg  50 mg Oral BID Love, Pamela S, PA-C   50 mg at 02/28/24 5956   nicotine  (NICODERM CQ  - dosed in mg/24 hours) patch 21 mg  21 mg Transdermal Daily Zelda Hickman, PA-C   21 mg at 02/28/24 3875   Oral care mouth rinse  15 mL Mouth Rinse 4 times per day Zelda Hickman, PA-C   15 mL at 02/28/24 6433   pantoprazole  (PROTONIX ) EC tablet 40 mg  40 mg Oral Daily Zelda Hickman, PA-C   40 mg  at 02/28/24 2951   polyethylene glycol (MIRALAX  / GLYCOLAX ) packet 17 g  17 g Oral Daily PRN Zelda Hickman, PA-C   17 g at 02/28/24 0511   prochlorperazine (COMPAZINE) tablet 5-10 mg  5-10 mg Oral Q6H PRN Jean Michaelis S, PA-C       Or   prochlorperazine (COMPAZINE) suppository 12.5 mg  12.5 mg Rectal Q6H PRN Love, Pamela S, PA-C       Or   prochlorperazine (COMPAZINE) injection 5-10 mg  5-10 mg Intravenous Q6H PRN Love, Pamela S, PA-C       sodium phosphate (FLEET) enema 1 enema  1 enema Rectal Once PRN Love, Pamela S, PA-C       traZODone (DESYREL) tablet 50 mg  50 mg Oral QHS Genetta Kenning, MD   50 mg at 02/27/24 2116      Review of Systems: 10 systems reviewed and negative except per interval history/subjective  Physical Exam: Vitals:   02/27/24 2116 02/28/24 0511  BP: (!) 166/102 133/85  Pulse: 86 68  Resp:  16  Temp:  98 F (36.7 C)  SpO2:  100%   Total I/O In: 120 [P.O.:120] Out: 225 [Urine:225]  Intake/Output Summary (Last 24 hours) at 02/28/2024 1242 Last data filed at 02/28/2024 8841 Gross per 24 hour  Intake 598 ml  Output 1125 ml  Net -527 ml   Constitutional: Chronically ill-appearing, lying in bed, no distress ENMT: ears and nose without scars or lesions, MMM CV: normal rate, no edema Respiratory: Bilateral chest rise, normal work of breathing Gastrointestinal: soft, non-tender, no palpable masses or hernias Skin: no visible lesions or rashes   Test Results I personally reviewed new and old clinical labs and radiology tests Lab Results  Component Value Date   NA 137 02/28/2024   K 3.5 02/28/2024   CL 101 02/28/2024   CO2 24 02/28/2024   BUN 30 (H) 02/28/2024   CREATININE 3.19 (H) 02/28/2024   CALCIUM  9.0 02/28/2024   ALBUMIN 3.3 (L) 02/28/2024   PHOS 4.0 02/28/2024    CBC Recent Labs  Lab 02/25/24 0440 02/26/24 0503  WBC 5.0 5.3  NEUTROABS  --  3.1  HGB 9.5* 9.7*  HCT 27.9* 28.5*  MCV 89.4 89.9  PLT 147* 224

## 2024-02-28 NOTE — Progress Notes (Signed)
 Occupational Therapy Session Note  Patient Details  Name: Juan Townsend MRN: [id number] Date of Birth: 1975-09-25  Today's Date: 02/28/2024 OT Individual Time: 7829-5621 OT Individual Time Calculation (min): 62 min    Short Term Goals: Week 1:  OT Short Term Goal 1 (Week 1): patient will don UB clothing with min A OT Short Term Goal 2 (Week 1): patient will don LB clothing with max A OT Short Term Goal 3 (Week 1): patient will complete funcitonal toilet and shower transfer with min A OT Short Term Goal 4 (Week 1): patient will demonstrate increased LUE incorperation as a stabilizer with min A  Skilled Therapeutic Interventions/Progress Updates:  Patient agreeable to participate in OT session. Reports no pain level.  Patient in bed when OT arrived, patient transferred to wc squat pivot with minA. Patient participated in skilled OT session focusing on neuromuscular re education for LUE and LLE. Patient guided in STIM treatment along with active muscular activation to increase functional use of LUE extensors to increase ability to perform ADLs. NMR performed in standing, with sit to stands utilizing quad facilitation with UE extensor and flexor facilitation to elicit improved motor control and coordination. Patient educated on importance of LUE use for daily activities to continue to improve function. Therapist facilitated tricep extension, quad activation and grip strength in functional mobility  in order to maximize safety and functional mobility. Patient returned to room, alarm on, all verbalized needs met and in reach.   .Therapy Documentation Precautions:  Precautions Precautions: Fall Recall of Precautions/Restrictions: Impaired Precaution/Restrictions Comments: L hemipareisis, permissive HTN, HIV (+) Restrictions Weight Bearing Restrictions Per Provider Order: No General:   Pain:   ADL: ADL Eating: Supervision/safety Where Assessed-Eating: Bed level Upper Body Bathing:  Moderate assistance Where Assessed-Upper Body Bathing: Shower Lower Body Bathing: Moderate cueing Where Assessed-Lower Body Bathing: Shower Upper Body Dressing: Moderate assistance Where Assessed-Upper Body Dressing: Wheelchair Lower Body Dressing: Maximal assistance Where Assessed-Lower Body Dressing: Standing at sink Toilet Transfer: Moderate verbal cueing Toilet Transfer Method: Squat pivot Toilet Transfer Equipment: Drop arm bedside commode, Grab bars Film/video editor: Moderate cueing Film/video editor Method: Administrator: Grab bars, Shower seat with back   Therapy/Group: Individual Therapy  [name] OTR/L  02/28/2024, 8:07 AM

## 2024-02-28 NOTE — Plan of Care (Signed)
  Problem: Consults Goal: RH STROKE PATIENT EDUCATION Description: See Patient Education module for education specifics  Outcome: Progressing   Problem: RH BOWEL ELIMINATION Goal: RH STG MANAGE BOWEL WITH ASSISTANCE Description: STG Manage Bowel with toileting Assistance. Outcome: Progressing Goal: RH STG MANAGE BOWEL W/MEDICATION W/ASSISTANCE Description: STG Manage Bowel with Medication with mod I Assistance. Outcome: Progressing   Problem: RH SAFETY Goal: RH STG ADHERE TO SAFETY PRECAUTIONS W/ASSISTANCE/DEVICE Description: STG Adhere to Safety Precautions With cues Assistance/Device. Outcome: Progressing   Problem: RH KNOWLEDGE DEFICIT Goal: RH STG INCREASE KNOWLEDGE OF HYPERTENSION Description: Patient and brother will be able to manage HTN using educational resources for medications and dietary modification recommendations independently Outcome: Progressing Goal: RH STG INCREASE KNOWLEDGE OF DYSPHAGIA/FLUID INTAKE Description: Patient and brother will be able to manage Dysphagia using educational resources for medications and dietary modification recommendations independently Outcome: Progressing Goal: RH STG INCREASE KNOWLEGDE OF HYPERLIPIDEMIA Description: Patient and brother will be able to manage HLD using educational resources for medications and dietary modification recommendations independently Outcome: Progressing Goal: RH STG INCREASE KNOWLEDGE OF STROKE PROPHYLAXIS Description: Patient and brother will be able to manage secondary risks using educational resources for medications and dietary modification recommendations independently Outcome: Progressing   Problem: RH KNOWLEDGE DEFICIT Goal: RH STG INCREASE KNOWLEDGE OF HYPERTENSION Description: Patient and brother will be able to manage HTN using educational resources for medications and dietary modification recommendations independently Outcome: Progressing Goal: RH STG INCREASE KNOWLEDGE OF DYSPHAGIA/FLUID  INTAKE Description: Patient and brother will be able to manage Dysphagia using educational resources for medications and dietary modification recommendations independently Outcome: Progressing Goal: RH STG INCREASE KNOWLEGDE OF HYPERLIPIDEMIA Description: Patient and brother will be able to manage HLD using educational resources for medications and dietary modification recommendations independently Outcome: Progressing Goal: RH STG INCREASE KNOWLEDGE OF STROKE PROPHYLAXIS Description: Patient and brother will be able to manage secondary risks using educational resources for medications and dietary modification recommendations independently Outcome: Progressing

## 2024-02-28 NOTE — Progress Notes (Signed)
 Speech Language Pathology Daily Session Note  Patient Details  Name: Juan Townsend MRN: [id number] Date of Birth: 09-Nov-1975  Today's Date: 02/28/2024 SLP Individual Time: 0730-0824 SLP Individual Time Calculation (min): 54 min  Short Term Goals: Week 1: SLP Short Term Goal 1 (Week 1): Patient will utilize swallowing compensatory strategies during consumption of D3/NTL diet given supervision multimodal A SLP Short Term Goal 2 (Week 1): Patient will demonstrate orientation to self, time, place, and situation given mod multimodal A SLP Short Term Goal 3 (Week 1): Patient will demonstrate basic problem solving skills given mod multimodal a SLP Short Term Goal 4 (Week 1): Patient will recall cognitive and physical changes since admission given mod multimodal A SLP Short Term Goal 5 (Week 1): Patient will recall daily activities given mod multimodal A  Skilled Therapeutic Interventions: SLP conducted skilled therapy session targeting cognitive and dysphagia goals. Upon SLP arrival, patient asleep in bed but easy to rouse and agreeable to all therapy activities. Patient flat throughout session and presenting with limited speech output but answers questions as needed. Patient indicated request to void, utilized urinal and was continent of bladder. Patient then consumed Dys3/NTL diet with min assist to appropriately utilized L head turn for tolerance of NTLs. Across meal, no overt instances of penetration/aspiration observed. During cognitive tasks, patient sequenced tasks of 5-6 steps accurately given min to mod assist. Patient was left in room with call bell in reach and alarm set. SLP will continue to target goals per plan of care.        Pain Pain Assessment Pain Scale: 0-10 Pain Score: 0-No pain  Therapy/Group: Individual Therapy  Lurlie Wigen, M.A., CCC-SLP  Con Arganbright A Annagrace Carr 02/28/2024, 8:26 AM

## 2024-02-29 LAB — RENAL FUNCTION PANEL
Albumin: 3.3 g/dL — ABNORMAL LOW (ref 3.5–5.0)
Anion gap: 8 (ref 5–15)
BUN: 26 mg/dL — ABNORMAL HIGH (ref 6–20)
CO2: 28 mmol/L (ref 22–32)
Calcium: 9.2 mg/dL (ref 8.9–10.3)
Chloride: 100 mmol/L (ref 98–111)
Creatinine, Ser: 3.03 mg/dL — ABNORMAL HIGH (ref 0.61–1.24)
GFR, Estimated: 25 mL/min — ABNORMAL LOW (ref >60)
Glucose, Bld: 111 mg/dL — ABNORMAL HIGH (ref 70–99)
Phosphorus: 3.5 mg/dL (ref 2.5–4.6)
Potassium: 3.9 mmol/L (ref 3.5–5.1)
Sodium: 136 mmol/L (ref 135–145)

## 2024-02-29 NOTE — Plan of Care (Signed)
  Problem: Consults Goal: RH STROKE PATIENT EDUCATION Description: See Patient Education module for education specifics  Outcome: Progressing   Problem: RH BOWEL ELIMINATION Goal: RH STG MANAGE BOWEL WITH ASSISTANCE Description: STG Manage Bowel with toileting Assistance. Outcome: Progressing Goal: RH STG MANAGE BOWEL W/MEDICATION W/ASSISTANCE Description: STG Manage Bowel with Medication with mod I Assistance. Outcome: Progressing   Problem: RH SAFETY Goal: RH STG ADHERE TO SAFETY PRECAUTIONS W/ASSISTANCE/DEVICE Description: STG Adhere to Safety Precautions With cues Assistance/Device. Outcome: Progressing   Problem: RH KNOWLEDGE DEFICIT Goal: RH STG INCREASE KNOWLEDGE OF HYPERTENSION Description: Patient and brother will be able to manage HTN using educational resources for medications and dietary modification recommendations independently Outcome: Progressing Goal: RH STG INCREASE KNOWLEDGE OF DYSPHAGIA/FLUID INTAKE Description: Patient and brother will be able to manage Dysphagia using educational resources for medications and dietary modification recommendations independently Outcome: Progressing Goal: RH STG INCREASE KNOWLEGDE OF HYPERLIPIDEMIA Description: Patient and brother will be able to manage HLD using educational resources for medications and dietary modification recommendations independently Outcome: Progressing Goal: RH STG INCREASE KNOWLEDGE OF STROKE PROPHYLAXIS Description: Patient and brother will be able to manage secondary risks using educational resources for medications and dietary modification recommendations independently Outcome: Progressing

## 2024-02-29 NOTE — Progress Notes (Signed)
 PROGRESS NOTE   Subjective/Complaints:  No events overnight.  No acute complaints. Vitals stable     02/29/2024    5:43 AM 02/29/2024    5:00 AM 02/29/2024    4:55 AM  Vitals with BMI  Weight  126 lbs 12 oz   BMI  20.47   Systolic 130  130  Diastolic 90  90  Pulse   74    No results for input(s): GLUCAP in the last 72 hours.   P.o. intakes appropriate  Incontinent of bladder , scans have been low Last BM 6/20, medium, continent Objective:   No results found. No results for input(s): WBC, HGB, HCT, PLT in the last 72 hours.  Recent Labs    02/28/24 0625 02/29/24 0714  NA 137 136  K 3.5 3.9  CL 101 100  CO2 24 28  GLUCOSE 149* 111*  BUN 30* 26*  CREATININE 3.19* 3.03*  CALCIUM  9.0 9.2    Intake/Output Summary (Last 24 hours) at 02/29/2024 1047 Last data filed at 02/29/2024 0400 Gross per 24 hour  Intake 360 ml  Output 850 ml  Net -490 ml        Physical Exam: Vital Signs Blood pressure (!) 130/90, pulse 74, temperature 98.5 F (36.9 C), temperature source Oral, resp. rate 17, height 5' 6 (1.676 m), weight 57.5 kg, SpO2 99%.   General: No acute distress.  Sitting up in bedside chair Mood and affect are appropriate Heart: Regular rate and rhythm no rubs murmurs or extra sounds Lungs: Clear to auscultation, breathing unlabored, no rales or wheezes Abdomen: Positive bowel sounds, soft nontender to palpation, nondistended Extremities: No clubbing, cyanosis, or edema Skin: No evidence of breakdown, no evidence of rash  Neurologic: Cranial nerves II through XII intact, motor strength is 5/5 in RIght deltoid, bicep, tricep, grip, hip flexor, knee extensors, ankle dorsiflexor and plantar flexor\ 2- left delt, bi, tri, 0/5 grip, 3- knee ext 0/5 at foot and ankle Sensory exam normal sensation to light touch and proprioception in bilateral upper and reduced LT lower extremities  Musculoskeletal:  Full range of motion in all 4 extremities. No joint swelling  Physical exam unchanged from the above on reexamination 02/29/24    Assessment/Plan: 1. Functional deficits which require 3+ hours per day of interdisciplinary therapy in a comprehensive inpatient rehab setting. Physiatrist is providing close team supervision and 24 hour management of active medical problems listed below. Physiatrist and rehab team continue to assess barriers to discharge/monitor patient progress toward functional and medical goals  Care Tool:  Bathing    Body parts bathed by patient: Left arm, Chest, Abdomen, Front perineal area, Right upper leg, Left upper leg, Face   Body parts bathed by helper: Right arm, Buttocks, Right lower leg, Left lower leg     Bathing assist Assist Level: Moderate Assistance - Patient 50 - 74%     Upper Body Dressing/Undressing Upper body dressing   What is the patient wearing?: Pull over shirt    Upper body assist Assist Level: Moderate Assistance - Patient 50 - 74%    Lower Body Dressing/Undressing Lower body dressing      What is the patient wearing?:  Incontinence brief, Pants     Lower body assist Assist for lower body dressing: 2 Helpers     Toileting Toileting    Toileting assist Assist for toileting: Total Assistance - Patient < 25%     Transfers Chair/bed transfer  Transfers assist  Chair/bed transfer activity did not occur: Safety/medical concerns  Chair/bed transfer assist level: Moderate Assistance - Patient 50 - 74%     Locomotion Ambulation   Ambulation assist      Assist level: Maximal Assistance - Patient 25 - 49% Assistive device: Hand held assist (+2) Max distance: 30 ft   Walk 10 feet activity   Assist     Assist level: Maximal Assistance - Patient 25 - 49% Assistive device: Hand held assist (+2)   Walk 50 feet activity   Assist Walk 50 feet with 2 turns activity did not occur: Safety/medical concerns          Walk 150 feet activity   Assist Walk 150 feet activity did not occur: Safety/medical concerns         Walk 10 feet on uneven surface  activity   Assist Walk 10 feet on uneven surfaces activity did not occur: Safety/medical concerns         Wheelchair     Assist Is the patient using a wheelchair?: Yes Type of Wheelchair: Manual    Wheelchair assist level: Dependent - Patient 0% Max wheelchair distance: 250 ft    Wheelchair 50 feet with 2 turns activity    Assist        Assist Level: Dependent - Patient 0%   Wheelchair 150 feet activity     Assist      Assist Level: Dependent - Patient 0%   Blood pressure (!) 130/90, pulse 74, temperature 98.5 F (36.9 C), temperature source Oral, resp. rate 17, height 5' 6 (1.676 m), weight 57.5 kg, SpO2 99%.  Medical Problem List and Plan: 1. 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 Will order PRAFO to use at night 2.  Antithrombotics: -DVT/anticoagulation:  Pharmaceutical: Lovenox              -antiplatelet therapy: DAPT X 3 weeks followed by ASA alone.  3. Pain Management: Tylenol  prn.  4. Mood/Behavior/Sleep: LCSW to follow for evaluation and support.              -antipsychotic agents: N/A--agitation has resolved.  5. Neuropsych/cognition: This patient is capable of making decisions on his own behalf. 6. Skin/Wound Care: Routine pressure relief measures.  7. Fluids/Electrolytes/Nutrition: Monitor I/O. Check CMET in am.  8.  HTN: Monitor BP TID and slowly  normalize over next few week per nephrology             --on Amlodipine  10, metoprolol  50 mg bid and hydralazine  100 mg every 8  hrs. Vitals:   02/29/24 0455 02/29/24 0543  BP: (!) 130/90 (!) 130/90  Pulse: 74   Resp: 17   Temp: 98.5 F (36.9 C)   SpO2: 99%   Still elevated nephro to address  6-21:  Blood pressure overall improved, continue amlodipine  10 mg, hydralazine  100 mg, metoprolol  50 mg per nephrology.  Appreciate their assistance   9. Dysphagia: D3, nectar (06/16) with head turn to left for all liquids. --Full supervision for safety and aspiration precautions.  10. 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 HIV disease: Continue BIKTARVY  50-200-25. Now followed by Dr. CHRISTELLA. Singh.  12. Anemia of chronic disease: Likely due to HIV/CKD. Will recheck in am.  13. Thrombocytopenia: Platelets 51 at admission. Now up to 147.  --Monitor for recovery 14. Hypocalcemia: Ionized calcium  1.08. Add supplement 15. Hyponatremia: Chronic?Baseline at 132-133.  --recheck in am.  -6/21: Sodium stable, 136- 137 16. Tobacco use: Nicotine  patch. Encourage nicotine  cessation.   17.  Nonoliguric AKI on ?CKD.   - Per nephrology, baseline creatinine likely between 1 and 1.6.  Sent studies for atypical HUS.  - 6/21: Creatinine downtrending, 3.03.  Continue current regimen.  Appreciate nephrology assistance.   LOS: 4 days A FACE TO FACE EVALUATION WAS PERFORMED  Juan Townsend Likes 02/29/2024, 10:47 AM

## 2024-02-29 NOTE — Progress Notes (Signed)
 Physical Therapy Session Note  Patient Details  Name: Juan Townsend MRN: [id number] Date of Birth: 21-Apr-1976  Today's Date: 02/29/2024 PT Individual Time: 9068-8985 PT Individual Time Calculation (min): 43 min   Short Term Goals: Week 1:  PT Short Term Goal 1 (Week 1): Pt will perform bed mobility with overall consistent CGA. PT Short Term Goal 2 (Week 1): Pt will perform sit<>stand to no AD with MinA. PT Short Term Goal 3 (Week 1): Pt will perform stand pivot transfers with consistent ModA. PT Short Term Goal 4 (Week 1): pt will perform squat pivot transfers with consistent CGA. PT Short Term Goal 5 (Week 1): Pt will ambulate 50 ft with ModA from therapist and CGA from +2.  Skilled Therapeutic Interventions/Progress Updates:  Patient supine in bed on entrance to room. Patient alert and agreeable to PT session. No pants donned and brief is soiled.   Patient with no pain complaint at start of session.  Therapeutic Activity: Bed Mobility: Pt performed supine > sit with intemittent MinA for balance. VC/ tc required for forward lean as he demonstrates significant use of trunk for compensatory limb movements and loses balance posteriorly. With appropriate forward lean, he is able to more adequately scoot forward and reach EOB in upright seated position and maintain balance/ upright posture with cues and minimal cueing for increasing lean to L. Transfers: Pt performed stand pivot transfer from bed to w/c to pt's R side requiring vc for postitioning and setup. Then pt able to stand, step R foot out and descend to sit with MinA. Provided vc/ tc for technique and sequencing.  Neuromuscular Re-ed: NMR facilitated during session with focus on standing balance, proprioception, and increasing muscle fiber recruitment. Pt guided in power up to 6 step using LLE with focus on increased force of push of foot into floor/ step with LLE and then placement of RLE on step. Then slow eccentric return to bring  RLE to floor. Progressed to ability to power up to step in one beat and lower to floor in 3 beats.   Also guided pt in blocked practice of rise to stand with equal WB to each LE in order to improve LLE activation into full LLE extension and shift of trunk to L for improved posturing. Progressed into 10x minisquats and then sit<>stands to no AD x10. Improved but delayed in sequence of increased extensor push of LE with continued need for MinA to straighten knee. Then shifts hips to L side and then brings trunk to L for improved midline orientation. This later improves RUE activation in progressing RW during gait.    NMR performed for improvements in motor control and coordination, balance, sequencing, judgement, and self confidence/ efficacy in performing all aspects of mobility at highest level of independence.   Gait Training:  Pt ambulated 50 ft using RW with overall ModA for balance and NDT cueing at trunk for improved lateral weight shift to L side as well as L trunk shortening (QL and lower trap as well as lower thoracic facilitation) and lengthening of R trunk.  Demonstrated good initiation at L hip for step progression. With fatigue, decreased step through with RLE and decreased step length with LLE. Provided vc for stepping L foot and strong extensor push of foot into floor just like at step in order to reduce ataxic movements/ bouncing at knee. This also improves RLE step length for improved step through and increased hip extension as well as knee flexion on L side just prior to  initiation of swing phase.   Patient supine in bed at end of session with brakes locked, bed alarm set, and all needs within reach.   Therapy Documentation Precautions:  Precautions Precautions: Fall Recall of Precautions/Restrictions: Impaired Precaution/Restrictions Comments: L hemipareisis, permissive HTN, HIV (+) Restrictions Weight Bearing Restrictions Per Provider Order: No  Pain: Pain Assessment Pain  Scale: 0-10 Pain Score: 0-No pain  Therapy/Group: Individual Therapy  [name] PT, DPT, CSRS 02/29/2024, 1:55 PM

## 2024-02-29 NOTE — Progress Notes (Signed)
 Physical Therapy Session Note  Patient Details  Name: Juan Townsend MRN: [id number] Date of Birth: August 02, 1976  Today's Date: 02/29/2024 PT Individual Time: 1425-1505 PT Individual Time Calculation (min): 40 min   Short Term Goals: Week 1:  PT Short Term Goal 1 (Week 1): Pt will perform bed mobility with overall consistent CGA. PT Short Term Goal 2 (Week 1): Pt will perform sit<>stand to no AD with MinA. PT Short Term Goal 3 (Week 1): Pt will perform stand pivot transfers with consistent ModA. PT Short Term Goal 4 (Week 1): pt will perform squat pivot transfers with consistent CGA. PT Short Term Goal 5 (Week 1): Pt will ambulate 50 ft with ModA from therapist and CGA from +2.  Skilled Therapeutic Interventions/Progress Updates: Patient supine in bed on entrance to room. Patient alert and agreeable to PT session.   Patient reported fatigue, but willing to do what he could with encouragement. PTA discussed with attending PT about pt's L scapular presentation (elevated + protracted). PTA palpated L upper trap musculature with noted trigger points/tension present (manual therapy performed as noted below).  Therapeutic Activity: Bed Mobility: Pt performed supine<>sit on EOB with CGA/minA with HOB elevated and VC to advance L LE onto bed vs using UE to pull up at end of session. Transfers: Pt performed sit<>stand pivot transfers throughout session from EOB<>WC with minA and multimodal cuing for weightshift and to increase L retro-step clearance/length, and to maintain safe proximity to RW.  Neuromuscular Re-ed: - PTA passively moved L LE into hip flexion while sitting in WC with cues for pt to hold peak position, and to control descent (able to engage hip flexors close to peak flexion, but unable to maintain for more than a few reps). Pt performed until close to fatigue with cues to breathe throughout.  - L UE scapular retraction + shoulder extension (elbow maintained in 90* by PTA to bias L  latissimus dorsi). PTA provided manual therapy. Decreased scapular retraction noted (pt performed this prior to manual therapy).  - Following manual therapy, pt performed same intervention with improved scapular retraction and improved strength in shoulder extension (PTA having to manually keep L scapular depressed to avoid upper trap activation). Pt also cued to avoid using R UE during exercise to solely focus on neuromuscular connection to L scapular retractors (tactile cuing on L lats).   NMR performed for improvements in motor control and coordination, balance, sequencing, judgement, and self confidence/ efficacy in performing all aspects of mobility at highest level of independence.   Manual Therapy: Palpation of L upper trap musculature performed with trigger points noted. Education and rationale provided with pt agreeing to participate in intervention. - Trigger point release to stated are with soft tissue mobilization to follow throughout. Increased time required to adjust to pt's tolerance to trigger point release with pt reporting increase in comfortability/decrease in pain (unrated). Pt further educated that since pt has decrease shoulder movement at deltoids, that pt is overcompensating with L upper trap, and that releasing trigger points will allow the scapular depressors to further engage throughout scapulohumeral rhythm.   Patient supine in bed at end of session with brakes locked, bed alarm set, and all needs within reach. NSG notified to alert attending NT to assist pt with change in brief at end of session.      Therapy Documentation Precautions:  Precautions Precautions: Fall Recall of Precautions/Restrictions: Impaired Precaution/Restrictions Comments: L hemipareisis, permissive HTN, HIV (+) Restrictions Weight Bearing Restrictions Per Provider Order: No  Therapy/Group:  Individual Therapy  Adeliz Tonkinson PTA 02/29/2024, 3:21 PM

## 2024-02-29 NOTE — Progress Notes (Signed)
 Occupational Therapy Session Note  Patient Details  Name: Juan Townsend MRN: [id number] Date of Birth: 1975/10/02  Today's Date: 02/29/2024 Session 1 OT Individual Time: 1105-1200 OT Individual Time Calculation (min): 55 min   Session 2 OT Individual Time: 1300-1400 OT Individual Time Calculation (min): 60 min    Short Term Goals: Week 1:  OT Short Term Goal 1 (Week 1): patient will don UB clothing with min A OT Short Term Goal 2 (Week 1): patient will don LB clothing with max A OT Short Term Goal 3 (Week 1): patient will complete funcitonal toilet and shower transfer with min A OT Short Term Goal 4 (Week 1): patient will demonstrate increased LUE incorperation as a stabilizer with min A  Skilled Therapeutic Interventions/Progress Updates:   Session 1   Pt greeted semi-reclined in bed awake and agreeable to OT treatment session. Pt agreeable to shower. Stand-pivot transfers bed to wc to shower seat with increased time and min A. Bathing completed with min/mod A. Integrated L UE into bathing tasks with hand over hand A from OT for neuro re-ed. Educated on hemi dressing techniques with pt demonstrating understanding. Min A to don shirt and mod A for pants. Pt brought to therapy gym in wc and worked on L UE NMR with cone stacking activity. Facilitation for normal movement patterns and for finger extension prior to grasping cones. Pt returned to room and left seated in wc with alarm on, call bell in reach, and needs met.    Session 2 Pt greeted seated in wc and agreeable to OT treatment session. Pt had just finishing lunch. Declined need to go to the bathroom. Pt brought outside for therapy session for mood boost and change of scenery. Addressed functional use of L UE using soft foam block and soft tan theraputty. Pt able to achieve some finger flexion and mild extension, but difficulty using functionally at this time. 3 sets of 5 sit<>stands with focus on anterior weight shift, power up, and  balance. When in standing, worked on balance and upright trunk while reaching to touch object In front alternating UE support. L UE NMR with weight bearing towel pushes on high-low table 3 sets of 20. Pt returned to room and completed stand-pivot back to bed with min A. Pt left semi-reclined in bed with bed alarm on, call bell In reach, and needs met .   Therapy Documentation Precautions:  Precautions Precautions: Fall Recall of Precautions/Restrictions: Impaired Precaution/Restrictions Comments: L hemipareisis, permissive HTN, HIV (+) Restrictions Weight Bearing Restrictions Per Provider Order: No Pain:     Therapy/Group: Individual Therapy  Sharyle GORMAN Pert 02/29/2024, 2:37 PM

## 2024-02-29 NOTE — Progress Notes (Signed)
 Nephrology Follow-Up Consult note   Assessment/Recommendations: Juan Townsend is a/an 48 y.o. male with  hypertension and HIV, admitted for CVA complicated by AKI.      Non-Oliguric AKI on possible CKD: Baseline creatinine hard to define.  Could be between 1 and 1.6 or even higher.  Creatinine elevated here and slowly rising.  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  Other forms of TMA are also possible but seem less likely because the thrombocytopenia improved quickly with controlling the blood pressure.  This would also fit with preponderance for CVA.  It is likely that the creatinine will improve with time, has not happened yet however-  urine pretty bland-   us  does show smaller left kidney but normal echogen  - Will send genetic studies for atypical HUS to be on the safe side - Leave blood pressure where it is for now but then improved control with time his creatinine stabilizes  No compelling need for HD-  crt down today for first time-  no changes for now   Thrombocytopenia: Present on arrival.  Improved with conservative management.  Likely related to hypertension but sending atypical HUS genetic studies as well.  Consider further workup if it returns  Hypertension: Blood pressure overall improved.  Continue current medications amlodipine  10, hydral 100 and metoprolol  50   CVA: Continue management per primary team  HIV: Home medications per primary team    Curtis DELENA Heman [facility] 02/29/2024 1:05 PM  ___________________________________________________________  Interval History/Subjective:   no c/o's-   1100 UOP-  crt down a little - working with PT   Medications:  Current Facility-Administered Medications  Medication Dose Route Frequency Provider Last Rate Last Admin   acetaminophen  (TYLENOL ) tablet 325-650 mg  325-650 mg Oral Q4H PRN Love, Pamela S, PA-C   650 mg at 02/28/24 1939   alum & mag  hydroxide-simeth (MAALOX/MYLANTA) 200-200-20 MG/5ML suspension 30 mL  30 mL Oral Q4H PRN Love, Pamela S, PA-C       amLODipine  (NORVASC ) tablet 10 mg  10 mg Oral Daily Love, Pamela S, PA-C   10 mg at 02/29/24 1034   aspirin  EC tablet 81 mg  81 mg Oral Daily Love, Pamela S, PA-C   81 mg at 02/29/24 1034   atorvastatin  (LIPITOR) tablet 20 mg  20 mg Oral Q supper Love, Pamela S, PA-C   20 mg at 02/28/24 1721   bictegravir-emtricitabine -tenofovir  AF (BIKTARVY ) 50-200-25 MG per tablet 1 tablet  1 tablet Oral Daily Love, Pamela S, PA-C   1 tablet at 02/29/24 1034   bisacodyl (DULCOLAX) suppository 10 mg  10 mg Rectal Daily PRN Love, Pamela S, PA-C       calcium  carbonate (OS-CAL - dosed in mg of elemental calcium ) tablet 1,250 mg  1 tablet Oral Q lunch Maurice Sharlet RAMAN, PA-C   1,250 mg at 02/28/24 1219   clopidogrel  (PLAVIX ) tablet 75 mg  75 mg Oral Daily Love, Pamela S, PA-C   75 mg at 02/29/24 1034   diphenhydrAMINE (BENADRYL) capsule 25 mg  25 mg Oral Q6H PRN Love, Pamela S, PA-C       docusate sodium  (COLACE) capsule 100 mg  100 mg Oral BID PRN Love, Pamela S, PA-C   100 mg at 02/28/24 0511   enoxaparin  (LOVENOX ) injection 30 mg  30 mg Subcutaneous Q24H Love, Pamela S, PA-C   30 mg at 02/28/24 1938   guaiFENesin-dextromethorphan (ROBITUSSIN DM) 100-10 MG/5ML syrup 5-10  mL  5-10 mL Oral Q6H PRN Maurice Sharlet RAMAN, PA-C       hydrALAZINE  (APRESOLINE ) tablet 100 mg  100 mg Oral Q8H Love, Pamela S, PA-C   100 mg at 02/29/24 0543   lidocaine (XYLOCAINE) 2 % jelly   Topical PRN Maurice Sharlet RAMAN, PA-C       metoprolol  tartrate (LOPRESSOR ) tablet 50 mg  50 mg Oral BID Love, Pamela S, PA-C   50 mg at 02/29/24 1034   nicotine  (NICODERM CQ  - dosed in mg/24 hours) patch 21 mg  21 mg Transdermal Daily Maurice Sharlet RAMAN, PA-C   21 mg at 02/29/24 1035   Oral care mouth rinse  15 mL Mouth Rinse 4 times per day Love, Pamela S, PA-C   15 mL at 02/28/24 2100   pantoprazole  (PROTONIX ) EC tablet 40 mg  40 mg Oral Daily Maurice Sharlet RAMAN, PA-C   40 mg at 02/29/24 1034   polyethylene glycol (MIRALAX  / GLYCOLAX ) packet 17 g  17 g Oral Daily PRN Maurice Sharlet RAMAN, PA-C   17 g at 02/28/24 9488   prochlorperazine (COMPAZINE) tablet 5-10 mg  5-10 mg Oral Q6H PRN Maurice Sharlet S, PA-C       Or   prochlorperazine (COMPAZINE) suppository 12.5 mg  12.5 mg Rectal Q6H PRN Love, Pamela S, PA-C       Or   prochlorperazine (COMPAZINE) injection 5-10 mg  5-10 mg Intravenous Q6H PRN Love, Pamela S, PA-C       sodium phosphate (FLEET) enema 1 enema  1 enema Rectal Once PRN Love, Pamela S, PA-C       traZODone  (DESYREL ) tablet 50 mg  50 mg Oral QHS Carilyn Prentice BRAVO, MD   50 mg at 02/28/24 2200      Review of Systems: 10 systems reviewed and negative except per interval history/subjective  Physical Exam: Vitals:   02/29/24 0455 02/29/24 0543  BP: (!) 130/90 (!) 130/90  Pulse: 74   Resp: 17   Temp: 98.5 F (36.9 C)   SpO2: 99%    Total I/O In: 236 [P.O.:236] Out: -   Intake/Output Summary (Last 24 hours) at 02/29/2024 1305 Last data filed at 02/29/2024 1300 Gross per 24 hour  Intake 596 ml  Output 850 ml  Net -254 ml   Constitutional: Chronically ill-appearing, lying in bed, no distress ENMT: ears and nose without scars or lesions, MMM CV: normal rate, no edema Respiratory: Bilateral chest rise, normal work of breathing Gastrointestinal: soft, non-tender, no palpable masses or hernias Skin: no visible lesions or rashes   Test Results I personally reviewed new and old clinical labs and radiology tests Lab Results  Component Value Date   NA 136 02/29/2024   K 3.9 02/29/2024   CL 100 02/29/2024   CO2 28 02/29/2024   BUN 26 (H) 02/29/2024   CREATININE 3.03 (H) 02/29/2024   CALCIUM  9.2 02/29/2024   ALBUMIN 3.3 (L) 02/29/2024   PHOS 3.5 02/29/2024    CBC Recent Labs  Lab 02/25/24 0440 02/26/24 0503  WBC 5.0 5.3  NEUTROABS  --  3.1  HGB 9.5* 9.7*  HCT 27.9* 28.5*  MCV 89.4 89.9  PLT 147* 224

## 2024-03-01 LAB — URINALYSIS, ROUTINE W REFLEX MICROSCOPIC
Bilirubin Urine: NEGATIVE
Glucose, UA: NEGATIVE mg/dL
Hgb urine dipstick: NEGATIVE
Ketones, ur: NEGATIVE mg/dL
Leukocytes,Ua: NEGATIVE
Nitrite: NEGATIVE
Protein, ur: NEGATIVE mg/dL
Specific Gravity, Urine: 1.013 (ref 1.005–1.030)
pH: 6 (ref 5.0–8.0)

## 2024-03-01 LAB — RENAL FUNCTION PANEL
Albumin: 3.2 g/dL — ABNORMAL LOW (ref 3.5–5.0)
Anion gap: 3 — ABNORMAL LOW (ref 5–15)
BUN: 32 mg/dL — ABNORMAL HIGH (ref 6–20)
CO2: 28 mmol/L (ref 22–32)
Calcium: 8.6 mg/dL — ABNORMAL LOW (ref 8.9–10.3)
Chloride: 103 mmol/L (ref 98–111)
Creatinine, Ser: 3.16 mg/dL — ABNORMAL HIGH (ref 0.61–1.24)
GFR, Estimated: 24 mL/min — ABNORMAL LOW (ref >60)
Glucose, Bld: 94 mg/dL (ref 70–99)
Phosphorus: 4.3 mg/dL (ref 2.5–4.6)
Potassium: 4.1 mmol/L (ref 3.5–5.1)
Sodium: 134 mmol/L — ABNORMAL LOW (ref 135–145)

## 2024-03-01 LAB — PROTEIN / CREATININE RATIO, URINE
Creatinine, Urine: 115 mg/dL
Protein Creatinine Ratio: 0.28 mg/mg{creat} — ABNORMAL HIGH (ref 0.00–0.15)
Total Protein, Urine: 32 mg/dL

## 2024-03-01 MED ORDER — SODIUM CHLORIDE 0.9 % IV SOLN: INTRAVENOUS | Status: AC

## 2024-03-01 MED ORDER — METOPROLOL TARTRATE 50 MG PO TABS: 75.0000 mg | ORAL_TABLET | Freq: Two times a day (BID) | ORAL | Status: DC

## 2024-03-01 MED ORDER — METOPROLOL TARTRATE 50 MG PO TABS
50.0000 mg | ORAL_TABLET | Freq: Two times a day (BID) | ORAL | Status: DC
  Administered 2024-03-01 – 2024-03-18 (×34): 50 mg via ORAL
  Filled 2024-03-01 (×34): qty 1

## 2024-03-01 NOTE — Progress Notes (Signed)
 Nephrology Follow-Up Consult note   Assessment/Recommendations: Juan Townsend is a/an 48 y.o. male with  hypertension and HIV, admitted for CVA complicated by AKI.      Non-Oliguric AKI on possible CKD: Baseline creatinine hard to define.  Could be between 1 and 1.6 or even higher.  Creatinine elevated here and slowly rising.  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  Other forms of TMA are also possible but seem less likely because the thrombocytopenia improved quickly with controlling the blood pressure.  This would also fit with preponderance for CVA.  It is likely that the creatinine will improve with time, has not happened yet however- seemed to be trending better yesterday but back up today -  urine pretty bland-   us  does show smaller left kidney but normal echogen  - have sent genetic studies for atypical HUS to be on the safe side - Leave blood pressure where it is for now but then improved control with time his creatinine stabilizes  No compelling need for HD-  crt down today for first time-  no changes for now-  will check again UA, prt.crt ratio and SPEP in AM as I dont see that one has been checked for completeness sake    Thrombocytopenia: Present on arrival.  Improved with conservative management.  Likely related to hypertension but sending atypical HUS genetic studies as well.  Consider further workup if it returns  Hypertension: Blood pressure overall improved.  Continue current medications amlodipine  10, hydral 100 and metoprolol  50 - I will check orthostatics to make sure BP is not getting too low   CVA: Continue management per primary team  HIV: Home medications per primary team    Curtis DELENA Heman [facility] 03/01/2024 11:38 AM  ___________________________________________________________  Interval History/Subjective:   no c/o's-    UOP not well recorded-  BUN /crt both up a little  BP has not  been low-  he denies dizziness-  flat affect like usual    Medications:  Current Facility-Administered Medications  Medication Dose Route Frequency Provider Last Rate Last Admin   0.9 %  sodium chloride  infusion   Intravenous Continuous Engler, Morgan C, DO       acetaminophen  (TYLENOL ) tablet 325-650 mg  325-650 mg Oral Q4H PRN Love, Pamela S, PA-C   650 mg at 02/28/24 1939   alum & mag hydroxide-simeth (MAALOX/MYLANTA) 200-200-20 MG/5ML suspension 30 mL  30 mL Oral Q4H PRN Love, Pamela S, PA-C       amLODipine  (NORVASC ) tablet 10 mg  10 mg Oral Daily Love, Pamela S, PA-C   10 mg at 03/01/24 9044   aspirin  EC tablet 81 mg  81 mg Oral Daily Love, Pamela S, PA-C   81 mg at 03/01/24 9044   atorvastatin  (LIPITOR) tablet 20 mg  20 mg Oral Q supper Love, Pamela S, PA-C   20 mg at 02/29/24 1802   bictegravir-emtricitabine -tenofovir  AF (BIKTARVY ) 50-200-25 MG per tablet 1 tablet  1 tablet Oral Daily Maurice Sharlet RAMAN, PA-C   1 tablet at 03/01/24 0955   bisacodyl (DULCOLAX) suppository 10 mg  10 mg Rectal Daily PRN Love, Pamela S, PA-C       calcium  carbonate (OS-CAL - dosed in mg of elemental calcium ) tablet 1,250 mg  1 tablet Oral Q lunch Maurice Sharlet RAMAN, PA-C   1,250 mg at 02/29/24 1358   clopidogrel  (PLAVIX ) tablet 75 mg  75 mg Oral Daily Love, Pamela S,  PA-C   75 mg at 03/01/24 0955   diphenhydrAMINE (BENADRYL) capsule 25 mg  25 mg Oral Q6H PRN Maurice Sharlet RAMAN, PA-C       docusate sodium  (COLACE) capsule 100 mg  100 mg Oral BID PRN Maurice Sharlet RAMAN, PA-C   100 mg at 02/28/24 0511   enoxaparin  (LOVENOX ) injection 30 mg  30 mg Subcutaneous Q24H Love, Pamela S, PA-C   30 mg at 02/29/24 2018   guaiFENesin-dextromethorphan (ROBITUSSIN DM) 100-10 MG/5ML syrup 5-10 mL  5-10 mL Oral Q6H PRN Maurice Sharlet S, PA-C       hydrALAZINE  (APRESOLINE ) tablet 100 mg  100 mg Oral Q8H Love, Pamela S, PA-C   100 mg at 03/01/24 9390   lidocaine (XYLOCAINE) 2 % jelly   Topical PRN Maurice Sharlet RAMAN, PA-C       metoprolol  tartrate  (LOPRESSOR ) tablet 50 mg  50 mg Oral BID Emeline Search C, DO       nicotine  (NICODERM CQ  - dosed in mg/24 hours) patch 21 mg  21 mg Transdermal Daily Maurice Sharlet RAMAN, PA-C   21 mg at 03/01/24 9042   Oral care mouth rinse  15 mL Mouth Rinse 4 times per day Maurice Sharlet RAMAN, PA-C   15 mL at 02/29/24 1803   pantoprazole  (PROTONIX ) EC tablet 40 mg  40 mg Oral Daily Maurice Sharlet RAMAN, PA-C   40 mg at 03/01/24 9044   polyethylene glycol (MIRALAX  / GLYCOLAX ) packet 17 g  17 g Oral Daily PRN Maurice Sharlet RAMAN, PA-C   17 g at 02/28/24 9488   prochlorperazine (COMPAZINE) tablet 5-10 mg  5-10 mg Oral Q6H PRN Maurice Sharlet S, PA-C       Or   prochlorperazine (COMPAZINE) suppository 12.5 mg  12.5 mg Rectal Q6H PRN Love, Pamela S, PA-C       Or   prochlorperazine (COMPAZINE) injection 5-10 mg  5-10 mg Intravenous Q6H PRN Love, Pamela S, PA-C       sodium phosphate (FLEET) enema 1 enema  1 enema Rectal Once PRN Love, Pamela S, PA-C       traZODone  (DESYREL ) tablet 50 mg  50 mg Oral QHS Carilyn Prentice BRAVO, MD   50 mg at 02/29/24 2018      Review of Systems: 10 systems reviewed and negative except per interval history/subjective  Physical Exam: Vitals:   02/29/24 1957 03/01/24 0334  BP: (!) 148/87 (!) 133/90  Pulse: 73 70  Resp: 18 17  Temp: 98.5 F (36.9 C) 98.5 F (36.9 C)  SpO2: 100% 100%   Total I/O In: 118 [P.O.:118] Out: 250 [Urine:250]  Intake/Output Summary (Last 24 hours) at 03/01/2024 1138 Last data filed at 03/01/2024 9165 Gross per 24 hour  Intake 590 ml  Output 600 ml  Net -10 ml   Constitutional: Chronically ill-appearing, lying in bed, no distress ENMT: ears and nose without scars or lesions, MMM CV: normal rate, no edema Respiratory: Bilateral chest rise, normal work of breathing Gastrointestinal: soft, non-tender, no palpable masses or hernias Skin: no visible lesions or rashes   Test Results I personally reviewed new and old clinical labs and radiology tests Lab Results   Component Value Date   NA 134 (L) 03/01/2024   K 4.1 03/01/2024   CL 103 03/01/2024   CO2 28 03/01/2024   BUN 32 (H) 03/01/2024   CREATININE 3.16 (H) 03/01/2024   CALCIUM  8.6 (L) 03/01/2024   ALBUMIN 3.2 (L) 03/01/2024   PHOS 4.3 03/01/2024  CBC Recent Labs  Lab 02/25/24 0440 02/26/24 0503  WBC 5.0 5.3  NEUTROABS  --  3.1  HGB 9.5* 9.7*  HCT 27.9* 28.5*  MCV 89.4 89.9  PLT 147* 224

## 2024-03-01 NOTE — Progress Notes (Signed)
 PROGRESS NOTE   Subjective/Complaints:  No events overnight.  No acute complaints. Labs this a.m. with hyponatremia, increasing BUN/creatinine.  Urinalysis negative. Blood pressure slightly elevated yesterday 130s -140s systolic.  Other vitals are stable.    03/01/2024    6:41 AM 03/01/2024    3:34 AM 02/29/2024    7:57 PM  Vitals with BMI  Weight 126 lbs 2 oz    BMI 20.36    Systolic  133 148  Diastolic  90 87  Pulse  70 73   P.o. intakes appropriate  Some mixed continence with bladder, scans low. Last bowel movement 6/22, medium  Objective:   No results found. No results for input(s): WBC, HGB, HCT, PLT in the last 72 hours.  Recent Labs    02/29/24 0714 03/01/24 0608  NA 136 134*  K 3.9 4.1  CL 100 103  CO2 28 28  GLUCOSE 111* 94  BUN 26* 32*  CREATININE 3.03* 3.16*  CALCIUM  9.2 8.6*    Intake/Output Summary (Last 24 hours) at 03/01/2024 1118 Last data filed at 03/01/2024 0834 Gross per 24 hour  Intake 590 ml  Output 600 ml  Net -10 ml        Physical Exam: Vital Signs Blood pressure (!) 133/90, pulse 70, temperature 98.5 F (36.9 C), temperature source Oral, resp. rate 17, height 5' 6 (1.676 m), weight 57.2 kg, SpO2 100%.   General: No acute distress.  Sitting up in bedside chair Mood and affect are appropriate Heart: Regular rate and rhythm no rubs murmurs or extra sounds Lungs: Clear to auscultation, breathing unlabored, no rales or wheezes Abdomen: Positive bowel sounds, soft nontender to palpation, nondistended Extremities: No clubbing, cyanosis, or edema Skin: No evidence of breakdown, no evidence of rash  Neurologic: Awake, alert, oriented x 4. Cranial nerves II through XII intact,  motor strength is 5/5 on the right 2- left delt, bi, tri, 0/5 grip, 3- knee ext 0/5 at foot and ankle Sensory exam normal sensation to light touch and proprioception in bilateral upper and reduced  LT lower extremities  Musculoskeletal: Full range of motion in all 4 extremities. No joint swelling  Physical exam unchanged from the above on reexamination 03/01/24    Assessment/Plan: 1. Functional deficits which require 3+ hours per day of interdisciplinary therapy in a comprehensive inpatient rehab setting. Physiatrist is providing close team supervision and 24 hour management of active medical problems listed below. Physiatrist and rehab team continue to assess barriers to discharge/monitor patient progress toward functional and medical goals  Care Tool:  Bathing    Body parts bathed by patient: Left arm, Chest, Abdomen, Front perineal area, Right upper leg, Left upper leg, Face   Body parts bathed by helper: Right arm, Buttocks, Right lower leg, Left lower leg     Bathing assist Assist Level: Moderate Assistance - Patient 50 - 74%     Upper Body Dressing/Undressing Upper body dressing   What is the patient wearing?: Pull over shirt    Upper body assist Assist Level: Moderate Assistance - Patient 50 - 74%    Lower Body Dressing/Undressing Lower body dressing      What is the patient  wearing?: Incontinence brief, Pants     Lower body assist Assist for lower body dressing: 2 Helpers     Toileting Toileting    Toileting assist Assist for toileting: Total Assistance - Patient < 25%     Transfers Chair/bed transfer  Transfers assist  Chair/bed transfer activity did not occur: Safety/medical concerns  Chair/bed transfer assist level: Moderate Assistance - Patient 50 - 74%     Locomotion Ambulation   Ambulation assist      Assist level: Maximal Assistance - Patient 25 - 49% Assistive device: Hand held assist (+2) Max distance: 30 ft   Walk 10 feet activity   Assist     Assist level: Maximal Assistance - Patient 25 - 49% Assistive device: Hand held assist (+2)   Walk 50 feet activity   Assist Walk 50 feet with 2 turns activity did not occur:  Safety/medical concerns         Walk 150 feet activity   Assist Walk 150 feet activity did not occur: Safety/medical concerns         Walk 10 feet on uneven surface  activity   Assist Walk 10 feet on uneven surfaces activity did not occur: Safety/medical concerns         Wheelchair     Assist Is the patient using a wheelchair?: Yes Type of Wheelchair: Manual    Wheelchair assist level: Dependent - Patient 0% Max wheelchair distance: 250 ft    Wheelchair 50 feet with 2 turns activity    Assist        Assist Level: Dependent - Patient 0%   Wheelchair 150 feet activity     Assist      Assist Level: Dependent - Patient 0%   Blood pressure (!) 133/90, pulse 70, temperature 98.5 F (36.9 C), temperature source Oral, resp. rate 17, height 5' 6 (1.676 m), weight 57.2 kg, SpO2 100%.  Medical Problem List and Plan: 1. 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 Will order PRAFO to use at night  - Stable to continue inpatient rehab  2.  Antithrombotics: -DVT/anticoagulation:  Pharmaceutical: Lovenox              -antiplatelet therapy: DAPT X 3 weeks followed by ASA alone.  3. Pain Management: Tylenol  prn.  4. Mood/Behavior/Sleep: LCSW to follow for evaluation and support.              -antipsychotic agents: N/A--agitation has resolved.  5. Neuropsych/cognition: This patient is capable of making decisions on his own behalf. 6. Skin/Wound Care: Routine pressure relief measures.  7. Fluids/Electrolytes/Nutrition: Monitor I/O. Check CMET in am.  8.  HTN: Monitor BP TID and slowly  normalize over next few week per nephrology              --on Amlodipine  10, metoprolol  50 mg bid and hydralazine  100 mg every 8 hrs. Vitals:   02/29/24 1957 03/01/24 0334  BP: (!) 148/87 (!) 133/90  Pulse: 73 70  Resp: 18 17  Temp: 98.5 F (36.9 C) 98.5 F (36.9 C)  SpO2: 100% 100%  Still elevated nephro to address  6-21: Blood pressure overall improved, continue amlodipine  10 mg, hydralazine  100 mg, metoprolol  50 mg per nephrology.  Appreciate their assistance 6-22: Blood pressure slightly increased; appreciate nephrology recommendations, holding off on starting diuretic given ongoing AKI  9. Dysphagia: D3, nectar (06/16) with head turn to left for all liquids. --Full supervision for safety and aspiration precautions.   10. 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  Sent studies for atypical HUS.  - 6/21: Creatinine downtrending, 3.03.  Continue current regimen.  Appreciate nephrology assistance.  6/22: BUN/creatinine up today, sodium down.  P.o. fluid intakes approximately 500 cc the last 2 days. Give 1 L IVF today, labs in AM.   11. HIV disease: Continue BIKTARVY  50-200-25. Now followed by Dr. CHRISTELLA. Singh.  12. Anemia of chronic disease: Likely due to HIV/CKD. Will recheck in am.  13. Thrombocytopenia: Platelets 51 at admission. Now up to 147.  --Monitor for recovery 14. Hypocalcemia: Ionized calcium  1.08. Add supplement 15. Hyponatremia: Chronic?Baseline at 132-133.  --recheck in am.  -6/21: Sodium stable, 136- 137 6/22: Na 134; NS 1 L as above  16. Tobacco use: Nicotine  patch. Encourage nicotine  cessation.      LOS: 5 days A FACE TO FACE EVALUATION WAS PERFORMED  Juan Townsend Likes 03/01/2024, 11:18 AM

## 2024-03-02 ENCOUNTER — Encounter (HOSPITAL_COMMUNITY): Payer: Self-pay | Admitting: Physical Medicine & Rehabilitation

## 2024-03-02 DIAGNOSIS — R4189 Other symptoms and signs involving cognitive functions and awareness: Secondary | ICD-10-CM

## 2024-03-02 LAB — RENAL FUNCTION PANEL
Albumin: 3 g/dL — ABNORMAL LOW (ref 3.5–5.0)
Anion gap: 3 — ABNORMAL LOW (ref 5–15)
BUN: 30 mg/dL — ABNORMAL HIGH (ref 6–20)
CO2: 27 mmol/L (ref 22–32)
Calcium: 8.6 mg/dL — ABNORMAL LOW (ref 8.9–10.3)
Chloride: 106 mmol/L (ref 98–111)
Creatinine, Ser: 2.78 mg/dL — ABNORMAL HIGH (ref 0.61–1.24)
GFR, Estimated: 28 mL/min — ABNORMAL LOW (ref >60)
Glucose, Bld: 98 mg/dL (ref 70–99)
Phosphorus: 4 mg/dL (ref 2.5–4.6)
Potassium: 3.7 mmol/L (ref 3.5–5.1)
Sodium: 136 mmol/L (ref 135–145)

## 2024-03-02 LAB — CBC
HCT: 26.1 % — ABNORMAL LOW (ref 39.0–52.0)
Hemoglobin: 8.6 g/dL — ABNORMAL LOW (ref 13.0–17.0)
MCH: 30.9 pg (ref 26.0–34.0)
MCHC: 33 g/dL (ref 30.0–36.0)
MCV: 93.9 fL (ref 80.0–100.0)
Platelets: 431 10*3/uL — ABNORMAL HIGH (ref 150–400)
RBC: 2.78 MIL/uL — ABNORMAL LOW (ref 4.22–5.81)
RDW: 13.4 % (ref 11.5–15.5)
WBC: 6.5 10*3/uL (ref 4.0–10.5)
nRBC: 0 % (ref 0.0–0.2)

## 2024-03-02 LAB — BASIC METABOLIC PANEL WITH GFR
Anion gap: 8 (ref 5–15)
BUN: 34 mg/dL — ABNORMAL HIGH (ref 6–20)
CO2: 25 mmol/L (ref 22–32)
Calcium: 9 mg/dL (ref 8.9–10.3)
Chloride: 106 mmol/L (ref 98–111)
Creatinine, Ser: 2.72 mg/dL — ABNORMAL HIGH (ref 0.61–1.24)
GFR, Estimated: 28 mL/min — ABNORMAL LOW (ref >60)
Glucose, Bld: 108 mg/dL — ABNORMAL HIGH (ref 70–99)
Potassium: 4.1 mmol/L (ref 3.5–5.1)
Sodium: 139 mmol/L (ref 135–145)

## 2024-03-02 NOTE — Discharge Instructions (Addendum)
 Inpatient Rehab Discharge Instructions  Juan Townsend Discharge date and time:  03/18/2024  Activities/Precautions/ Functional Status: Activity: no lifting, driving, or strenuous exercise  till cleared by MD Diet: cardiac diet Wound Care: none needed   Functional status:  ___ No restrictions     ___ Walk up steps independently _X__ 24/7 supervision/assistance   ___ Walk up steps with assistance ___ Intermittent supervision/assistance  ___ Bathe/dress independently ___ Walk with walker     ___ Bathe/dress with assistance ___ Walk Independently    ___ Shower independently _X__ Walk with supervision    _X__ Shower with assistance _X__ No alcohol/ No Tobacco    ___ Return to work/school ________   Special Instructions: Family needs to assist with medication management.    COMMUNITY REFERRALS UPON DISCHARGE:    Home Exercise Program given    Medical Equipment/Items Ordered:rolling walker, 3 in 1 and tub bench                                                 Agency/Supplier:[employer]    [id number]  Match placed for 30 day prescription assistance   STROKE/TIA DISCHARGE INSTRUCTIONS SMOKING Cigarette smoking nearly doubles your risk of having a stroke & is the single most alterable risk factor  If you smoke or have smoked in the last 12 months, you are advised to quit smoking for your health. Most of the excess cardiovascular risk related to smoking disappears within a year of stopping. Ask you doctor about anti-smoking medications [facility] Quit Line: 1-800-QUIT NOW Free Smoking Cessation Classes [phone]  CHOLESTEROL Know your levels; limit fat & cholesterol in your diet  Lipid Panel     Component Value Date/Time   CHOL 165 02/19/2024 0515   TRIG 201 (H) 02/19/2024 0515   HDL 26 (L) 02/19/2024 0515   CHOLHDL 6.3 02/19/2024 0515   VLDL 40 02/19/2024 0515   LDLCALC 99 02/19/2024 0515   LDLCALC 71 10/17/2023 1622     Many patients benefit from treatment even if their  cholesterol is at goal. Goal: Total Cholesterol (CHOL) less than 160 Goal:  Triglycerides (TRIG) less than 150 Goal:  HDL greater than 40 Goal:  LDL (LDLCALC) less than 100   BLOOD PRESSURE American Stroke Association blood pressure target is less that 120/80 mm/Hg  Your discharge blood pressure is:  BP: 114/83 Monitor your blood pressure Limit your salt and alcohol intake Many individuals will require more than one medication for high blood pressure  DIABETES (A1c is a blood sugar average for last 3 months) Goal HGBA1c is under 7% (HBGA1c is blood sugar average for last 3 months)  Diabetes:     Lab Results  Component Value Date   HGBA1C 4.4 (L) 02/19/2024    Your HGBA1c can be lowered with medications, healthy diet, and exercise. Check your blood sugar as directed by your physician Call your physician if you experience unexplained or low blood sugars.  PHYSICAL ACTIVITY/REHABILITATION Goal is 30 minutes at least 4 days per week  Activity: No driving, Therapies: see above Return to work: N/A Activity decreases your risk of heart attack and stroke and makes your heart stronger.  It helps control your weight and blood pressure; helps you relax and can improve your mood. Participate in a regular exercise program. Talk with your doctor about the best form of exercise for you (  dancing, walking, swimming, cycling).  DIET/WEIGHT Goal is to maintain a healthy weight  Your discharge diet is:  Diet Order             Diet regular Fluid consistency: Thin  Diet effective now                   liquids Your height is:  Height: 5' 6 (167.6 cm) Your current weight is: Weight: 62.1 kg Your Body Mass Index (BMI) is:  BMI (Calculated): 22.11 Following the type of diet specifically designed for you will help prevent another stroke. You are at goal weight  Your goal Body Mass Index (BMI) is 19-24. Healthy food habits can help reduce 3 risk factors for stroke:  High cholesterol, hypertension,  and excess weight.  RESOURCES Stroke/Support Group:  Call [phone]   STROKE EDUCATION PROVIDED/REVIEWED AND GIVEN TO PATIENT Stroke warning signs and symptoms How to activate emergency medical system (call 911). Medications prescribed at discharge. Need for follow-up after discharge. Personal risk factors for stroke. Pneumonia vaccine given:  Flu vaccine given:  My questions have been answered, the writing is legible, and I understand these instructions.  I will adhere to these goals & educational materials that have been provided to me after my discharge from the hospital.     My questions have been answered and I understand these instructions. I will adhere to these goals and the provided educational materials after my discharge from the hospital.  Patient/Caregiver Signature _______________________________ Date __________  Clinician Signature _______________________________________ Date __________  Please bring this form and your medication list with you to all your follow-up doctor's appointments.

## 2024-03-02 NOTE — Progress Notes (Signed)
 Patient ID: Juan Townsend, male   DOB: [birth year], 48 y.o.   MRN: [id number] S: No new complaints.  O:BP (!) 157/95 (BP Location: Left Arm)   Pulse 70   Temp 98.6 F (37 C)   Resp 18   Ht 5' 6 (1.676 m)   Wt 58.6 kg   SpO2 100%   BMI 20.85 kg/m   Intake/Output Summary (Last 24 hours) at 03/02/2024 1400 Last data filed at 03/02/2024 1249 Gross per 24 hour  Intake 318 ml  Output 300 ml  Net 18 ml   Intake/Output: I/O last 3 completed shifts: In: 472 [P.O.:472] Out: 750 [Urine:750]  Intake/Output this shift:  Total I/O In: 200 [P.O.:200] Out: -  Weight change: 1.4 kg Gen: NAD CVS: RRR Resp:CTA Abd: +BS, soft, NT/ND Ext: no edema  Recent Labs  Lab 02/25/24 0440 02/26/24 0503 02/27/24 1038 02/28/24 0625 02/29/24 0714 03/01/24 0608 03/02/24 0517  NA 132* 134* 136 137 136 134* 136  K 3.6 3.7 3.8 3.5 3.9 4.1 3.7  CL 101 101 100 101 100 103 106  CO2 24 25 25 24 28 28 27   GLUCOSE 105* 94 120* 149* 111* 94 98  BUN 24* 26* 27* 30* 26* 32* 30*  CREATININE 2.76* 2.86* 2.93* 3.19* 3.03* 3.16* 2.78*  ALBUMIN  --  3.3* 3.5 3.3* 3.3* 3.2* 3.0*  CALCIUM  8.5* 8.9 9.5 9.0 9.2 8.6* 8.6*  PHOS  --   --  3.9 4.0 3.5 4.3 4.0  AST  --  22  --   --   --   --   --   ALT  --  21  --   --   --   --   --    Liver Function Tests: Recent Labs  Lab 02/26/24 0503 02/27/24 1038 02/29/24 0714 03/01/24 0608 03/02/24 0517  AST 22  --   --   --   --   ALT 21  --   --   --   --   ALKPHOS 39  --   --   --   --   BILITOT 1.1  --   --   --   --   PROT 7.7  --   --   --   --   ALBUMIN 3.3*   < > 3.3* 3.2* 3.0*   < > = values in this interval not displayed.   No results for input(s): LIPASE, AMYLASE in the last 168 hours. No results for input(s): AMMONIA in the last 168 hours. CBC: Recent Labs  Lab 02/25/24 0440 02/26/24 0503  WBC 5.0 5.3  NEUTROABS  --  3.1  HGB 9.5* 9.7*  HCT 27.9* 28.5*  MCV 89.4 89.9  PLT 147* 224   Cardiac Enzymes: No results for input(s): CKTOTAL,  CKMB, CKMBINDEX, TROPONINI in the last 168 hours. CBG: Recent Labs  Lab 02/24/24 2022 02/25/24 0036 02/25/24 0418 02/25/24 0744 02/25/24 1130  GLUCAP 104* 104* 116* 106* 108*    Iron Studies: No results for input(s): IRON, TIBC, TRANSFERRIN, FERRITIN in the last 72 hours. Studies/Results: No results found.  amLODipine   10 mg Oral Daily   aspirin  EC  81 mg Oral Daily   atorvastatin   20 mg Oral Q supper   bictegravir-emtricitabine -tenofovir  AF  1 tablet Oral Daily   calcium  carbonate  1 tablet Oral Q lunch   clopidogrel   75 mg Oral Daily   enoxaparin  (LOVENOX ) injection  30 mg Subcutaneous Q24H   hydrALAZINE   100 mg Oral Q8H  metoprolol  tartrate  50 mg Oral BID   nicotine   21 mg Transdermal Daily   mouth rinse  15 mL Mouth Rinse 4 times per day   pantoprazole   40 mg Oral Daily   traZODone   50 mg Oral QHS    BMET    Component Value Date/Time   NA 136 03/02/2024 0517   K 3.7 03/02/2024 0517   CL 106 03/02/2024 0517   CO2 27 03/02/2024 0517   GLUCOSE 98 03/02/2024 0517   BUN 30 (H) 03/02/2024 0517   CREATININE 2.78 (H) 03/02/2024 0517   CREATININE 1.24 10/17/2023 1622   CALCIUM  8.6 (L) 03/02/2024 0517   GFRNONAA 28 (L) 03/02/2024 0517   CBC    Component Value Date/Time   WBC 5.3 02/26/2024 0503   RBC 3.17 (L) 02/26/2024 0503   HGB 9.7 (L) 02/26/2024 0503   HCT 28.5 (L) 02/26/2024 0503   PLT 224 02/26/2024 0503   MCV 89.9 02/26/2024 0503   MCH 30.6 02/26/2024 0503   MCHC 34.0 02/26/2024 0503   RDW 13.2 02/26/2024 0503   LYMPHSABS 1.3 02/26/2024 0503   MONOABS 0.7 02/26/2024 0503   EOSABS 0.1 02/26/2024 0503   BASOSABS 0.0 02/26/2024 0503    Assessment/Recommendations: Juan Townsend is a/an 48 y.o. male with  hypertension and HIV, admitted for CVA complicated by AKI.        Non-Oliguric AKI on possible CKD: Baseline creatinine hard to define.  Could be between 1 and 1.6 or even higher.  Creatinine elevated here and slowly rising.  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  Other forms of TMA are also possible but seem less likely because the thrombocytopenia improved quickly with controlling the blood pressure.  This would also fit with preponderance for CVA.  Scr is starting to slowly improve.  -  urine pretty bland-   US  does show smaller left kidney but normal echogenicity   - have sent genetic studies for atypical HUS to be on the safe side - Leave blood pressure where it is for now but then improved control with time as his creatinine stabilizes   No compelling need for HD-  crt down today for first time-  no changes for now-  UA remains bland, UPC 280 mg/g, and SPEP pending    Thrombocytopenia: Present on arrival.  Improved with conservative management.  Likely related to hypertension but sending atypical HUS genetic studies as well.  Consider further workup if it returns   Hypertension: Blood pressure overall improved.  Continue current medications amlodipine  10, hydral 100 and metoprolol  50 - I will check orthostatics to make sure BP is not getting too low    CVA: Continue management per primary team   HIV: Home medications per primary team  [name], MD [facility]

## 2024-03-02 NOTE — Plan of Care (Signed)
  Problem: Consults Goal: RH STROKE PATIENT EDUCATION Description: See Patient Education module for education specifics  Outcome: Progressing   Problem: RH BOWEL ELIMINATION Goal: RH STG MANAGE BOWEL WITH ASSISTANCE Description: STG Manage Bowel with toileting Assistance. Outcome: Progressing Goal: RH STG MANAGE BOWEL W/MEDICATION W/ASSISTANCE Description: STG Manage Bowel with Medication with mod I  Assistance. Outcome: Progressing   Problem: RH SAFETY Goal: RH STG ADHERE TO SAFETY PRECAUTIONS W/ASSISTANCE/DEVICE Description: STG Adhere to Safety Precautions With  cues Assistance/Device. Outcome: Progressing

## 2024-03-02 NOTE — Progress Notes (Signed)
 Occupational Therapy Session Note  Patient Details  Name: Juan Townsend MRN: [id number] Date of Birth: June 25, 1976  Today's Date: 03/02/2024 OT Individual Time: 8472-8387 OT Individual Time Calculation (min): 45 min    Short Term Goals: Week 1:  OT Short Term Goal 1 (Week 1): patient will don UB clothing with min A OT Short Term Goal 2 (Week 1): patient will don LB clothing with max A OT Short Term Goal 3 (Week 1): patient will complete funcitonal toilet and shower transfer with min A OT Short Term Goal 4 (Week 1): patient will demonstrate increased LUE incorperation as a stabilizer with min A  Skilled Therapeutic Interventions/Progress Updates:    Patient found in room in wheelchair. Patient agreeable to skilled OT. Patient completed sit to stand with min A from wheelchair. Patient completed UE strengthening activity working on multiple systems while at elevated table. OT facilitated knee extension for standing activity while facilitating affected UE strengthening and coordination to grip and remove items stuck to table. Patient tolerated activity well. Patient completed functional mobility in hall at banister with increased R foot clearance observed leading to increased quad activation on L side. Patient able to complete functional mobility with OT facilitating L quad extension and proper pelvic positioning due to imbalance with patients adductors. Patient returned to room, min A for wc to bed transfer, min A for supine to sit. All verbalized needs met, call bell in reach alarm on.   Therapy Documentation Precautions:  Precautions Precautions: Fall Recall of Precautions/Restrictions: Impaired Precaution/Restrictions Comments: L hemipareisis, permissive HTN, HIV (+) Restrictions Weight Bearing Restrictions Per Provider Order: No  Pain:   Therapy/Group: Individual Therapy  D'mariea L Josalynn Johndrow OTR/L  03/02/2024, 8:34 AM

## 2024-03-02 NOTE — Progress Notes (Signed)
 Physical Therapy Session Note  Patient Details  Name: Juan Townsend MRN: [id number] Date of Birth: November 03, 1975  Today's Date: 03/02/2024 PT Individual Time: 8695-8654 PT Individual Time Calculation (min): 41 min   Short Term Goals: Week 1:  PT Short Term Goal 1 (Week 1): Pt will perform bed mobility with overall consistent CGA. PT Short Term Goal 2 (Week 1): Pt will perform sit<>stand to no AD with MinA. PT Short Term Goal 3 (Week 1): Pt will perform stand pivot transfers with consistent ModA. PT Short Term Goal 4 (Week 1): pt will perform squat pivot transfers with consistent CGA. PT Short Term Goal 5 (Week 1): Pt will ambulate 50 ft with ModA from therapist and CGA from +2.  Skilled Therapeutic Interventions/Progress Updates:     Pt received seated in [facility] Suite and agrees to therapy. No complaint of pain. PT assists to don shoes prior to mobility. WC transport to gym. Pt performs stand pivot transfer from [facility] to mat table with minA and cues for initiation, sequencing, and positioning. Pt performs sit to stand from mat with minA and cues for body mechanics and sequencing. Pt performs lateral weight shifting progressing to Rt step forward and step backward to promote WB through LLE. PT provides minA/modA at trunk to promote weight shifting and stability of Lt knee. Following seated rest break, pt ambulates x35' without AD, with modA at trunk for stability, as well as facilitating lateral weight shifting, with verbal cues to kick LLE and utilize wider stride width to prevent scissoring. Pt takes seated rest break, then requests to ambulate with RW. Pt ambulates x35' with RW and Lt hand splint, with minA/modA and similar cues. Following rest break, pt ambulates additional x70' with similar assistance and cues, utilizing RW and hand splint. Following, pt left seated in WC with alarm intact and all needs within reach.   Therapy Documentation Precautions:  Precautions Precautions: Fall Recall of  Precautions/Restrictions: Impaired Precaution/Restrictions Comments: L hemipareisis, permissive HTN, HIV (+) Restrictions Weight Bearing Restrictions Per Provider Order: No   Therapy/Group: Individual Therapy  Elsie JAYSON Dawn, PT, DPT 03/02/2024, 5:06 PM

## 2024-03-02 NOTE — Progress Notes (Signed)
 Speech Language Pathology Daily Session Note  Patient Details  Name: Juan Townsend MRN: [id number] Date of Birth: 11/14/1975  Today's Date: 03/02/2024 SLP Individual Time: 1100-1157 SLP Individual Time Calculation (min): 57 min  Short Term Goals: Week 1: SLP Short Term Goal 1 (Week 1): Patient will utilize swallowing compensatory strategies during consumption of D3/NTL diet given supervision multimodal A SLP Short Term Goal 2 (Week 1): Patient will demonstrate orientation to self, time, place, and situation given mod multimodal A SLP Short Term Goal 3 (Week 1): Patient will demonstrate basic problem solving skills given mod multimodal a SLP Short Term Goal 4 (Week 1): Patient will recall cognitive and physical changes since admission given mod multimodal A SLP Short Term Goal 5 (Week 1): Patient will recall daily activities given mod multimodal A  Skilled Therapeutic Interventions: Skilled therapy session focused on dysphagia and cognitive goals. SLP targeted dysphagia goals through prompting completion of oropharyngeal swallowing exercises. Patient completed x30 repetitions of chin tucks against resistance, x10 [place] and x10 effortful swallows. Patient independently recalled need for L headturn during nectar thick liquid consumption and utilized with no s/sx of aspiration. SLP targeted cognitive goals through simple money management activity. Patient organized and counted change according to verbalized amounts given minA. Patient left in [facility] [place] with alarm set and call bell in reach. Continue POC  Pain Denies  Therapy/Group: Individual Therapy  Erasmus Bistline M.A., CCC-SLP 03/02/2024, 7:44 AM

## 2024-03-02 NOTE — Progress Notes (Signed)
 PROGRESS NOTE   Subjective/Complaints: No c/os Mild /Mod HTN  Weekend coverage ordered IVF, will hold tonight and ask for nephro input      03/02/2024    5:44 AM 03/02/2024    5:43 AM 03/01/2024    8:33 PM  Vitals with BMI  Weight 129 lbs 3 oz    BMI 20.86    Systolic 157 157 849  Diastolic 95 95 88  Pulse 70        Objective:   No results found. No results for input(s): WBC, HGB, HCT, PLT in the last 72 hours.  Recent Labs    03/01/24 0608 03/02/24 0517  NA 134* 136  K 4.1 3.7  CL 103 106  CO2 28 27  GLUCOSE 94 98  BUN 32* 30*  CREATININE 3.16* 2.78*  CALCIUM  8.6* 8.6*    Intake/Output Summary (Last 24 hours) at 03/02/2024 0942 Last data filed at 03/02/2024 0100 Gross per 24 hour  Intake 354 ml  Output 300 ml  Net 54 ml        Physical Exam: Vital Signs Blood pressure (!) 157/95, pulse 70, temperature 98.6 F (37 C), resp. rate 18, height 5' 6 (1.676 m), weight 58.6 kg, SpO2 100%.   General: No acute distress.  Sitting up in bedside chair Mood and affect are appropriate Heart: Regular rate and rhythm no rubs murmurs or extra sounds Lungs: Clear to auscultation, breathing unlabored, no rales or wheezes Abdomen: Positive bowel sounds, soft nontender to palpation, nondistended Extremities: No clubbing, cyanosis, or edema Skin: No evidence of breakdown, no evidence of rash  Neurologic: Awake, alert, oriented x 4. Cranial nerves II through XII intact,  motor strength is 5/5 on the right 2- left delt, bi, tri, 0/5 grip, 3- knee ext 0/5 at foot and ankle Sensory exam normal sensation to light touch and proprioception in bilateral upper and reduced LT lower extremities  Musculoskeletal: Full range of motion in all 4 extremities. No joint swelling  Physical exam unchanged from the above on reexamination 03/02/24    Assessment/Plan: 1. Functional deficits which require 3+ hours per day of  interdisciplinary therapy in a comprehensive inpatient rehab setting. Physiatrist is providing close team supervision and 24 hour management of active medical problems listed below. Physiatrist and rehab team continue to assess barriers to discharge/monitor patient progress toward functional and medical goals  Care Tool:  Bathing    Body parts bathed by patient: Left arm, Chest, Abdomen, Front perineal area, Right upper leg, Left upper leg, Face   Body parts bathed by helper: Right arm, Buttocks, Right lower leg, Left lower leg     Bathing assist Assist Level: Moderate Assistance - Patient 50 - 74%     Upper Body Dressing/Undressing Upper body dressing   What is the patient wearing?: Pull over shirt    Upper body assist Assist Level: Moderate Assistance - Patient 50 - 74%    Lower Body Dressing/Undressing Lower body dressing      What is the patient wearing?: Incontinence brief, Pants     Lower body assist Assist for lower body dressing: 2 Helpers     Editor, commissioning  assist Assist for toileting: Total Assistance - Patient < 25%     Transfers Chair/bed transfer  Transfers assist  Chair/bed transfer activity did not occur: Safety/medical concerns  Chair/bed transfer assist level: Moderate Assistance - Patient 50 - 74%     Locomotion Ambulation   Ambulation assist      Assist level: Maximal Assistance - Patient 25 - 49% Assistive device: Hand held assist (+2) Max distance: 30 ft   Walk 10 feet activity   Assist     Assist level: Maximal Assistance - Patient 25 - 49% Assistive device: Hand held assist (+2)   Walk 50 feet activity   Assist Walk 50 feet with 2 turns activity did not occur: Safety/medical concerns         Walk 150 feet activity   Assist Walk 150 feet activity did not occur: Safety/medical concerns         Walk 10 feet on uneven surface  activity   Assist Walk 10 feet on uneven surfaces activity did  not occur: Safety/medical concerns         Wheelchair     Assist Is the patient using a wheelchair?: Yes Type of Wheelchair: Manual    Wheelchair assist level: Dependent - Patient 0% Max wheelchair distance: 250 ft    Wheelchair 50 feet with 2 turns activity    Assist        Assist Level: Dependent - Patient 0%   Wheelchair 150 feet activity     Assist      Assist Level: Dependent - Patient 0%   Blood pressure (!) 157/95, pulse 70, temperature 98.6 F (37 C), resp. rate 18, height 5' 6 (1.676 m), weight 58.6 kg, SpO2 100%.  Medical Problem List and Plan: 1. 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 Will order PRAFO to use at night  - Stable to continue inpatient rehab  2.  Antithrombotics: -DVT/anticoagulation:  Pharmaceutical: Lovenox              -antiplatelet therapy: DAPT X 3 weeks followed by ASA alone.  3. Pain Management: Tylenol  prn.  4. Mood/Behavior/Sleep: LCSW to follow for evaluation and support.              -antipsychotic agents: N/A--agitation has resolved.  5. Neuropsych/cognition: This patient is capable of making decisions on his own behalf. 6. Skin/Wound Care: Routine pressure relief measures.  7. Fluids/Electrolytes/Nutrition: Monitor I/O. Check CMET in am.  8.  HTN: Monitor BP TID and slowly  normalize over next few week per nephrology             --on Amlodipine  10, metoprolol  50 mg bid and hydralazine  100 mg every 8 hrs. Vitals:   03/02/24 0543 03/02/24 0544  BP: (!) 157/95 (!) 157/95  Pulse:  70  Resp:  18  Temp:  98.6 F (37 C)  SpO2:  100%  Still elevated nephro to address  6-21: Blood pressure overall  improved, continue amlodipine  10 mg, hydralazine  100 mg, metoprolol  50 mg per nephrology.  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 Dysphagia: D3, nectar (06/16) with head turn to left for all liquids. --Full supervision for safety and aspiration precautions.   10. 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  Sent studies for atypical HUS.      Latest Ref Rng & Units 03/02/2024    5:17 AM 03/01/2024    6:08 AM 02/29/2024    7:14 AM  BMP  Glucose 70 - 99 mg/dL 98  94  888   BUN 6 - 20 mg/dL 30  32  26   Creatinine 0.61 - 1.24 mg/dL 7.21  6.83  6.96   Sodium 135 - 145 mmol/L 136  134  136   Potassium 3.5 - 5.1 mmol/L 3.7  4.1  3.9   Chloride 98 - 111 mmol/L 106  103  100   CO2 22 - 32 mmol/L 27  28  28    Calcium  8.9 - 10.3 mg/dL 8.6  8.6  9.2      11. HIV disease: Continue BIKTARVY  50-200-25. Now followed by Dr. CHRISTELLA. Singh.  12. Anemia of chronic disease: Likely due to HIV/CKD. Will recheck in am.  13. Thrombocytopenia: Platelets 51 at admission. Now up to 147.  --Monitor for recovery 14. Hypocalcemia: Ionized calcium  1.08. on supplement, corrected to 9.0 when factoring in low alb 15. Hyponatremia: mild/borderline, improved   16. Tobacco use: Nicotine  patch. Encourage nicotine  cessation.      LOS: 6 days A FACE TO FACE EVALUATION WAS PERFORMED  Juan Townsend 03/02/2024, 9:42 AM

## 2024-03-02 NOTE — Consult Note (Signed)
 Neuropsychological Consultation Comprehensive Inpatient Rehab   Patient:   Alandis Laakso   DOB:   December 04, 1975  MR Number:  [id number]  Location:  [facility] [address] CENTER A [address] [place] [address] Dept: [phone] Loc: [phone]           Date of Service:   03/02/2024  Start Time:   9 AM End Time:   10 AM  Provider/Observer:  Norleen Asa, Psy.D.       Clinical Neuropsychologist       Billing Code/Service: [phone]  Reason for Service:    Francisco Ostrovsky is a 48 year old male referred for neuropsychological consultation during his ongoing admission to the comprehensive inpatient rehabilitation unit following a recent stroke in the right ACA/MCA brain regions, likely watershed in nature due to large vessel disease and a potential hypertensive event. The patient has a past medical history including hypertension, chronic kidney disease, HIV, and a prior stroke without residual deficits. He was admitted on 02/18/2024 after reporting multiple falls with left-sided weakness and vision changes. 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 The patient had last known normal status at 8 PM the evening before. A urine drug screen was positive for THC. CTA revealed right MCA occlusion. Initially, the patient had issues with agitation requiring Zyprexa . Speech difficulties, including symptoms of word salad, were noted on 6/11. MRI brain was not done due to a shrapnel injury. CT head was negative. The patient was out of the window for TNK. Neurology felt the strokes were likely watershed due to large vessel disease. The patient continued to have significant bouts of agitation requiring soft restraints. Episodes of worsening speech difficulties and intermittent aphasia were noted. The patient continued to have limitations related to left-sided weakness, left inattention impacting ADLs, cognitive deficits with  difficulty processing, as well as dysphagia. He was admitted to CIR due to functional decline.  During today's clinical visit, the patient was oriented but had slowed information processing, focus execution abilities, and some dysarthria with speech. The patient was able to effectively express himself, but there was a paucity of word use, with no word salad noted. He was aware that he was in the hospital in [place] and that he had had a recent stroke. The patient was also aware of his recent and past medical history. He previously lived in [place] , and had moved to [place] without establishing medical care. He was off his antiretrovirals and not managing any of his underlying medical issues. The patient has now connected with infectious disease specialists who have begun treating his HIV status. He presented with a very flat affect but was intelligible and appeared to have adequate receptive language. Today, we primarily worked on coping and adjustment with the extended hospital stay and recent stroke, along with other significant medical issues to address. One of the focuses of today's interaction was barriers to care and establishing medical care in the region, emphasizing the need for the patient to stay on top of it and participate in medication management. The patient noted that he is becoming more aware and understanding of what is going on around him and feels like he has made improvements in his cognition but continues to have slowed information processing speed and impacts on his fluency for expressive language. How much of his flat affect is longstanding is difficult to address.  HPI for the current admission:    HPI:  Marcellus Lonon  is a 48 year old male with history of HTN, CKD- baseline SCr 1.2-1.5, HIV, prior stroke without residual deficits who was admitted on 02/18/24 with reports of multiple falls, left sided weakness and vision changes. He was noted to have malignant  HTN @ 261/129 and started on Cleveprex. LKN 8:00 pm day before, NIHSS 5 with CT head negative for acute changes. UDS positive for THC. ETOH < 15. 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  He had  issues with agitation requiring Zyprexa  and developed speech difficulty with word salad early am of 06/11. MRI brain not done due to sharpnel injury and repeat  CT head negative. He was out of window for intervention with perfusion deficits indicating established core infarct. Dr. Jerri felt that R-ACA/MCA strokes likely watershed due to large vessel disease and recommended DAPT x 3 weeks followed by ASA alone.  Thrombocytopenia being monitored. Acute on chronic renal failure treated with IVF. 2D echo done showing EF 60-75% with no wall abnormality, trivial pericardial effusion and severe concentric LVH with hyperdynamic RVF   He has had fall as well as bouts of agitation requiring waist belt and soft restraints.he did  have worsening of speech difficulty with follow up CT head 03/12 showed slight increase in prominence of 7 mm hypodensity involving posterior limb right internal capsule. He was made NPO due to L-CN VII deficits with intermittent aphasia and cough with water .MBS done on 06/13 showing aspiration of thins and nectars--he was started on D3, honey liquids.  His renal status continued to worsen with rise in SCr to 2.7 and nephrology consulted for input yesterday.  Renal ultrasound was negative for hydronephrosis and showed mildly trabeculated appearence of bladder with redundant folds/wall prominence question in setting of chronic BOO. He was felt to have acute on chronic renal failure in setting of elevated BP w/subsequent correction v/s TMA given initial mild thrombocytopenia. Dr. Macel recommends continue to watch SCr for improvement with time and slowly decreasing BP over next few weeks.  Patient  continues to be limited by left sided weakness, left inattention with ADLs, cognitive deficits with difficulty processing as well as dysphagia. He requires +2 mod to max assist with mobility and mod assist with ADLs. He was independent PTA and CIR recommended due to functional decline.   Medical History:   Past Medical History:  Diagnosis Date   HIV (human immunodeficiency virus infection) (HCC)    Hypercholesteremia    Hypertension    Stroke [facility])          Patient Active Problem List   Diagnosis Date Noted   Cognitive change 03/02/2024   Acute right ACA stroke (HCC) 02/25/2024   Left-sided weakness 02/20/2024   Hypertensive urgency 02/20/2024   CVA (cerebral vascular accident) (HCC) 02/18/2024    Behavioral Observation/Mental Status:   Zakariya Knickerbocker  presents as a 48 y.o.-year-old Right handed African American Male who appeared his stated age. his dress was Appropriate and he was Well Groomed and his manners were Appropriate to the situation.  his participation was indicative of Appropriate and Redirectable behaviors.  There were physical disabilities noted.  he displayed an appropriate level of cooperation and motivation.    Interactions:    Active Redirectable  Attention:   abnormal and attention span appeared shorter than expected for age  Memory:   abnormal; remote memory intact, recent memory impaired  Visuo-spatial:   not examined  Speech (Volume):  low  Speech:   normal; slowed response style with paucity of range of verbal expression  Thought Process:  Coherent and Relevant  Coherent and Linear  Though Content:  WNL; not suicidal and not homicidal  Orientation:   person, place, and situation  Judgment:   Fair  Planning:   Poor  Affect:    Blunted and Lethargic  Mood:    Dysphoric  Insight:   Fair  Intelligence:   normal  Psychiatric History:  No prior psychiatric history noted  Family Med/Psych History:  Family History  Problem Relation Age of Onset    Stroke Mother    Alcohol abuse Mother    Heart Problems Mother    Heart failure Mother    Alcohol abuse Father    Asthma Sister    Diabetes Paternal Grandfather    Cancer Paternal Grandfather     Impression/DX:   Shaye Luger is a 48 year old male referred for neuropsychological consultation during his ongoing admission to the comprehensive inpatient rehabilitation unit following a recent stroke in the right ACA/MCA brain regions, likely watershed in nature due to large vessel disease and a potential hypertensive event. The patient has a past medical history including hypertension, chronic kidney disease, HIV, and a prior stroke without residual deficits. He was admitted on 02/18/2024 after reporting multiple falls with left-sided weakness and vision changes. 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 The patient had last known normal status at 8 PM the evening before. A urine drug screen was positive for THC. CTA revealed right MCA occlusion. Initially, the patient had issues with agitation requiring Zyprexa . Speech difficulties, including symptoms of word salad, were noted on 6/11. MRI brain was not done due to a shrapnel injury. CT head was negative. The patient was out of the window for TNK. Neurology felt the strokes were likely watershed due to large vessel disease. The patient continued to have significant bouts of agitation requiring soft restraints. Episodes of worsening speech difficulties and intermittent aphasia were noted. The patient continued to have limitations related to left-sided weakness, left inattention impacting ADLs, cognitive deficits with difficulty processing, as well as dysphagia. He was admitted to CIR due to functional decline.  During today's clinical visit, the patient was oriented but had slowed information processing, focus execution abilities, and some dysarthria with speech. The patient was able to effectively express himself, but  there was a paucity of word use, with no word salad noted. He was aware that he was in the hospital in [place] and that he had had a recent stroke. The patient was also aware of his recent and past medical history. He previously lived in [place], [place] , and had moved to [place] without establishing medical care. He was off his antiretrovirals and not managing any of his underlying medical issues. The patient has now connected with infectious disease specialists who have begun treating his HIV status. He presented with a very flat affect but was intelligible and appeared to have adequate receptive language. Today, we primarily worked on coping and adjustment with the extended hospital stay and recent stroke, along with other significant medical issues to address. One of the focuses of today's interaction was barriers to care and establishing medical care in the region, emphasizing the need for the patient to stay on top of it and participate in medication management. The patient noted that he is becoming more aware and understanding of what is going on around him and feels  like he has made improvements in his cognition but continues to have slowed information processing speed and impacts on his fluency for expressive language. How much of his flat affect is longstanding is difficult to address.         Electronically Signed   _______________________ Norleen Asa, Psy.D. Clinical Neuropsychologist

## 2024-03-02 NOTE — Progress Notes (Signed)
 Occupational Therapy Session Note  Patient Details  Name: Juan Townsend MRN: [id number] Date of Birth: 1975/10/10  Today's Date: 03/02/2024 OT Individual Time: 0803-0900 OT Individual Time Calculation (min): 57 min    Short Term Goals: Week 1:  OT Short Term Goal 1 (Week 1): patient will don UB clothing with min A OT Short Term Goal 2 (Week 1): patient will don LB clothing with max A OT Short Term Goal 3 (Week 1): patient will complete funcitonal toilet and shower transfer with min A OT Short Term Goal 4 (Week 1): patient will demonstrate increased LUE incorperation as a stabilizer with min A  Skilled Therapeutic Interventions/Progress Updates:     Pt received sitting up in bed finishing up his breakfast with nursing staff present in room providing supervision. Pt presenting with flat affect, receptive to skilled OT session reporting 0/10 pain- OT offering intermittent rest breaks, repositioning, and therapeutic support to optimize participation in therapy session. Pt noted to be soiled upon OT arrival, receptive to getting cleaned up and taking shower this AM to complete his morning routine- focused this session on BADL retraining to increase overall safety and independence in ADLs. Pt transitioned to EOB using bed rail with CGA and min verbal cues for technique. Stand pivot no AD EOB > wc to RW min A for balance and min verbal cues for hand placement. Transported Pt to bathroom via wc and set-up wc for transfer to shower seat. Stand pivot wc > shower seat using grab bar min A. Doffed brief total A with Pt in standing position, min A provided for balance with B UE support on grab bar. Pt doffed shirt using hemi-technique SUP. Pt completed majority of U/LB bating in seated position on shower chair with back- CGA-min A provided for dynamic sitting balance when crossing LEs to wash feet and lower B LEs d/t Pt fall over to L. Facilitated neuro re-ed to L UE by OT providing HOH A during UB bathing tasks  and Pt providing self HOH A when washing upper B LEs with min verbal cues for technique. Pt dried self in seated following shower with mod A. Stand pivot shower seat > wc using grab bar min A. Re-educated Pt on hemi-dressing technique with demonstration provided. Pt then able to donn shirt with min A and pants with mod A required to stabilize L LE when weaving foot and to bring pants to waist in standing. Positioned Pt at sink for grooming/hygiene tasks with Pt able to complete oral care using L UE as a stabilizer with SUP. Pt was left resting in wc with call bell in reach, seatbelt alarm on, and all needs met.    Therapy Documentation Precautions:  Precautions Precautions: Fall Recall of Precautions/Restrictions: Impaired Precaution/Restrictions Comments: L hemipareisis, permissive HTN, HIV (+) Restrictions Weight Bearing Restrictions Per Provider Order: No   Therapy/Group: Individual Therapy  Katheryn SHAUNNA [place] 03/02/2024, 7:50 AM

## 2024-03-03 LAB — RENAL FUNCTION PANEL
Albumin: 3 g/dL — ABNORMAL LOW (ref 3.5–5.0)
Anion gap: 4 — ABNORMAL LOW (ref 5–15)
BUN: 32 mg/dL — ABNORMAL HIGH (ref 6–20)
CO2: 28 mmol/L (ref 22–32)
Calcium: 8.7 mg/dL — ABNORMAL LOW (ref 8.9–10.3)
Chloride: 105 mmol/L (ref 98–111)
Creatinine, Ser: 2.75 mg/dL — ABNORMAL HIGH (ref 0.61–1.24)
GFR, Estimated: 28 mL/min — ABNORMAL LOW (ref >60)
Glucose, Bld: 95 mg/dL (ref 70–99)
Phosphorus: 4.3 mg/dL (ref 2.5–4.6)
Potassium: 3.9 mmol/L (ref 3.5–5.1)
Sodium: 137 mmol/L (ref 135–145)

## 2024-03-03 LAB — PROTEIN ELECTROPHORESIS, SERUM
A/G Ratio: 0.8 (ref 0.7–1.7)
Albumin ELP: 3 g/dL (ref 2.9–4.4)
Alpha-1-Globulin: 0.3 g/dL (ref 0.0–0.4)
Alpha-2-Globulin: 0.7 g/dL (ref 0.4–1.0)
Beta Globulin: 0.9 g/dL (ref 0.7–1.3)
Gamma Globulin: 1.9 g/dL — ABNORMAL HIGH (ref 0.4–1.8)
Globulin, Total: 3.8 g/dL (ref 2.2–3.9)
Total Protein ELP: 6.8 g/dL (ref 6.0–8.5)

## 2024-03-03 NOTE — Progress Notes (Signed)
 Speech Language Pathology Daily Session Note  Patient Details  Name: Juan Townsend MRN: [id number] Date of Birth: Mar 07, 1976  Today's Date: 03/03/2024 SLP Individual Time: 1432-1530 SLP Individual Time Calculation (min): 58 min  Short Term Goals: Week 1: SLP Short Term Goal 1 (Week 1): Patient will utilize swallowing compensatory strategies during consumption of D3/NTL diet given supervision multimodal A SLP Short Term Goal 2 (Week 1): Patient will demonstrate orientation to self, time, place, and situation given mod multimodal A SLP Short Term Goal 3 (Week 1): Patient will demonstrate basic problem solving skills given mod multimodal a SLP Short Term Goal 4 (Week 1): Patient will recall cognitive and physical changes since admission given mod multimodal A SLP Short Term Goal 5 (Week 1): Patient will recall daily activities given mod multimodal A  Skilled Therapeutic Interventions: Skilled therapy session focused on cognitive and dysphagia goals. SLP facilitated completion of medication management task to encourage use of problem solving, memory and organizational skills. SLP provided min fading to supervision A for patient to complete BID pillbox according to written and verbalized directions. Patient with x1 mistake, though able to self correct with min cues. SLP educate patient on continuing to utilize pillbox upon d/c to aid in planning, organization, and memory. Patient verbalized understanding. SLP continued to challenge patient through oropharyngeal swallowing exercises. Patient completed x20 chin tucks against resistance and independently recalled use of L headturn during NTL intake. Patient transferred to [name] and continent of urine. Patient left in [name] with alarm set and call bell in reach. Continue POC.   Pain None reported   Therapy/Group: Individual Therapy  Carmin Dibartolo M.A., CCC-SLP 03/03/2024, 7:42 AM

## 2024-03-03 NOTE — Plan of Care (Signed)
  Problem: RH BOWEL ELIMINATION Goal: RH STG MANAGE BOWEL WITH ASSISTANCE Description: STG Manage Bowel with toileting Assistance. Outcome: Progressing Goal: RH STG MANAGE BOWEL W/MEDICATION W/ASSISTANCE Description: STG Manage Bowel with Medication with mod I Assistance. Outcome: Progressing   Problem: RH SAFETY Goal: RH STG ADHERE TO SAFETY PRECAUTIONS W/ASSISTANCE/DEVICE Description: STG Adhere to Safety Precautions With cues Assistance/Device. Outcome: Progressing   Problem: RH KNOWLEDGE DEFICIT Goal: RH STG INCREASE KNOWLEDGE OF HYPERTENSION Description: Patient and brother will be able to manage HTN using educational resources for medications and dietary modification recommendations independently Outcome: Progressing

## 2024-03-03 NOTE — Progress Notes (Signed)
 Speech Language Pathology Weekly Progress and Session Note  Patient Details  Name: Juan Townsend MRN: [id number] Date of Birth: 05-Nov-1975  Beginning of progress report period: February 26, 2024 End of progress report period: March 03, 2024  Short Term Goals: Week 1: SLP Short Term Goal 1 (Week 1): Patient will utilize swallowing compensatory strategies during consumption of D3/NTL diet given supervision multimodal A SLP Short Term Goal 1 - Progress (Week 1): Met SLP Short Term Goal 2 (Week 1): Patient will demonstrate orientation to self, time, place, and situation given mod multimodal A SLP Short Term Goal 2 - Progress (Week 1): Met SLP Short Term Goal 3 (Week 1): Patient will demonstrate basic problem solving skills given mod multimodal a SLP Short Term Goal 3 - Progress (Week 1): Met SLP Short Term Goal 4 (Week 1): Patient will recall cognitive and physical changes since admission given mod multimodal A SLP Short Term Goal 4 - Progress (Week 1): Met SLP Short Term Goal 5 (Week 1): Patient will recall daily activities given mod multimodal A SLP Short Term Goal 5 - Progress (Week 1): Met  New Short Term Goals: Week 2: SLP Short Term Goal 1 (Week 2): STGs=LTGs d/t ELOS  Weekly Progress Updates: Patient has made excellent progress towards therapy goals, meeting 5/5 short term goals set for reporting period duration. Patient currently benefits from min assist for recall of daily events, problem solving, min to mod assist for awareness of deficits, and min assist for orientation x4 using external aids. Patient utilizes compensatory swallow safety strategies to tolerate Dys3/NTL diet with supervision and SLP has targeted pharyngeal strengthening exercises to work towards improving swallow function. This upcoming reporting period, will plan to trial upgraded diet textures and perform updated MBS to objectively assess oropharyngeal swallowing improvement. Patient and family education ongoing. Patient  will continue to benefit from skilled therapy services during remainder of CIR stay.       Intensity: Minumum of 1-2 x/day, 30 to 90 minutes Frequency: 3 to 5 out of 7 days Duration/Length of Stay: 10-14 days Treatment/Interventions: Cognitive remediation/compensation;Cueing hierarchy;Dysphagia/aspiration precaution training;Functional tasks;Internal/external aids;Oral motor exercises;Patient/family education;Therapeutic Activities  Rosina Downy, M.A., CCC-SLP  Hadley Detloff A Danaiya Steadman 03/03/2024, 2:43 PM

## 2024-03-03 NOTE — Progress Notes (Signed)
 Physical Therapy Session Note  Patient Details  Name: Juan Townsend MRN: [id number] Date of Birth: Nov 26, 1975  Today's Date: 03/03/2024 PT Individual Time: 1400-1430 PT Individual Time Calculation (min): 30 min   Short Term Goals: Week 1:  PT Short Term Goal 1 (Week 1): Pt will perform bed mobility with overall consistent CGA. PT Short Term Goal 2 (Week 1): Pt will perform sit<>stand to no AD with MinA. PT Short Term Goal 3 (Week 1): Pt will perform stand pivot transfers with consistent ModA. PT Short Term Goal 4 (Week 1): pt will perform squat pivot transfers with consistent CGA. PT Short Term Goal 5 (Week 1): Pt will ambulate 50 ft with ModA from therapist and CGA from +2.  Skilled Therapeutic Interventions/Progress Updates:      Pt presents in wheelchair in agreement to therapy session. Pt has no reports of pain but does have flat affect during session.   Transported patient to main rehab hallway. Completes sit<>stand using R hand rail with minA with cues for awareness of L foot placement to ensure adequate BOS. CGA for static standing balance with rail support. Gait training 54ft + 23ft with R hand rail with minA with cues for widening BOS and improving his upright posture.  Wheelchair propulsion training using BLE only for foot propelling with emphasis on hip flexion and knee flexion to activate posterior chain. Patient needed minA for ~35ft of w/c propulsion using BLE only.   Pt returned to his room and left sitting up in wheelchair with safety belt alarm on and needs met.   Therapy Documentation Precautions:  Precautions Precautions: Fall Recall of Precautions/Restrictions: Impaired Precaution/Restrictions Comments: L hemipareisis, permissive HTN, HIV (+) Restrictions Weight Bearing Restrictions Per Provider Order: No General:   Therapy/Group: Individual Therapy  [name] 03/03/2024, 12:18 PM

## 2024-03-03 NOTE — Progress Notes (Signed)
 Physical Therapy Session Note  Patient Details  Name: Juan Townsend MRN: [id number] Date of Birth: April 20, 1976  Today's Date: 03/03/2024 PT Individual Time: 0803-0902 PT Individual Time Calculation (min): 59 min   Short Term Goals: Week 1:  PT Short Term Goal 1 (Week 1): Pt will perform bed mobility with overall consistent CGA. PT Short Term Goal 2 (Week 1): Pt will perform sit<>stand to no AD with MinA. PT Short Term Goal 3 (Week 1): Pt will perform stand pivot transfers with consistent ModA. PT Short Term Goal 4 (Week 1): pt will perform squat pivot transfers with consistent CGA. PT Short Term Goal 5 (Week 1): Pt will ambulate 50 ft with ModA from therapist and CGA from +2.  Skilled Therapeutic Interventions/Progress Updates:  Patient supine in bed on entrance to room. Patient alert and agreeable to PT session. Pt relates his brief was changed this morning and wants to wear same clothes. Later in session, noted pt's shirt and pants were wet with clean brief.   Patient with no pain complaint at start of session.  Therapeutic Activity: Bed Mobility: Pt performed supine > sit with HOB elevated, uses bedrail and able to reach seated position EOB with close supervision. No cues provided this day and pt is able to bring UB up to upright seated position with increased ease.  Transfers: Pt performed sit<>stand and stand pivot transfers throughout session with ModA initially and when setup correctly with vc/ tc then is able to perform with CGA. Provided vc/ tc throughout session for correct setup prior to rise to stand and for controlling descent to sit.  Gait Training:  Pt ambulated 83' x1/ 82' x1 using RW with overall MinA for balance with need for intermittent ModA to prevent LOB. Demonstrated R trunk lean and holding head in R lateral cervical flexion. Also demo'd adduction of LLE in swing phase with difficulty in hip/ knee flexion  and almost ataxic bounce in L knee during stance phase when  attempting to stand with hip/ knee extension - all demo'd especially when fatigued. NDT cueing at trunk for improved lateral weight shift to L side as well as L trunk shortening (QL and lower trap as well as lower thoracic facilitation) and lengthening of R trunk.  Provided vc for stepping L foot and strong extensor push of foot into floor to attempt to decrease bouncing in stance phase. Minimal intermittent cue downward at knee for increased extensor push and upward cue provide just below knee to initiate hip/ knee flexion to start swing phase.   Neuromuscular Re-ed: NMR facilitated during session with focus on standing balance and muscle fiber recruitment/ motor control. Pt guided in sequence of preparing body position to correct sit>stands throughout session and again during blocked practice. Pt provided with vc for leaning forward to scoot fwd, R hand position to w/c armrest, then sufficient fwd lean to reach upright stance with ease. Otherwise pt has been attempting to vault up to stance with posterior LOB requiring MinA to maintain upright position. With proper cueing pt can control rise to stand and find initial upright balance with close supervision. Progressed to progressively lower squat to chair height with slight bias to L side for improved quad and glute activation.   NMR performed for improvements in motor control and coordination, balance, sequencing, judgement, and self confidence/ efficacy in performing all aspects of mobility at highest level of independence.   On return to room, pt taken to bathroom and doffed pants with MaxA while standing at  safety rail. After sitting, pt able to doff shirt with MinA and don new shirt with ModA. Provided with washcloth and pt performed frontal pericare and washup with supervision, then stood at rail and provided with MinA for balance while using RUE to wash backside. Assisted with donning pants Mod/ MaxA.   Patient seated upright in bed at end of  session with brakes locked, bed alarm set, and all needs within reach.   Therapy Documentation Precautions:  Precautions Precautions: Fall Recall of Precautions/Restrictions: Impaired Precaution/Restrictions Comments: L hemipareisis, permissive HTN, HIV (+) Restrictions Weight Bearing Restrictions Per Provider Order: No  Pain: Pain Assessment Pain Scale: 0-10 Pain Score: 0-No pain  Therapy/Group: Individual Therapy  [name] PT, DPT, CSRS 03/03/2024, 7:59 AM

## 2024-03-03 NOTE — Progress Notes (Signed)
 Occupational Therapy Weekly Progress Note  Patient Details  Name: Juan Townsend MRN: [id number] Date of Birth: [birth year]  Beginning of progress report period: February 26, 2024 End of progress report period: March 03, 2024  Today's Date: 03/03/2024 OT Individual Time: 8970-8871 OT Individual Time Calculation (min): 59 min    Patient has met 4 of 4 short term goals.  Patient is progressing with all ADLs.  UB dressing- min A- requires verbal cues for dressing and education on hemi techniques to increase independence in dressing tasks LB dressing- mod A UB bathing- min LB bathing - min, requires assist for dynamic balance due to decreased postural control, decreased motor coordination, and education on hemi bathing techniques.  Toilet/ shower transfers-min A utilizing gab bars with verbal cues for positioning and balance  Toileting- requires max A for dynamic balance, set up for perineal hygiene, and dynamic balance and clothing management required for standing urination.   Patient continues to demonstrate the following deficits: muscle weakness, decreased cardiorespiratoy endurance, impaired timing and sequencing, abnormal tone, unbalanced muscle activation, ataxia, decreased coordination, and decreased motor planning, decreased midline orientation and decreased motor planning, decreased awareness, decreased problem solving, decreased safety awareness, and delayed processing, and decreased standing balance and hemiplegia and therefore will continue to benefit from skilled OT intervention to enhance overall performance with BADL, iADL, and Reduce care partner burden.  Caregiver training has not yet been completed, however will be scheduled. D/c plan to return home with family assist.  Patient progressing toward long term goals..  Continue plan of care.  OT Short Term Goals Week 2:  OT Short Term Goal 1 (Week 2): patient will utilize LUE as stabilizer in daily activities with CGA OT Short Term  Goal 2 (Week 2): patient will demonstrate ability to perform transfers to wc, toilet, shower with CGA OT Short Term Goal 3 (Week 2): patient will demonstrate a  Skilled Therapeutic Interventions/Progress Updates:  Patient agreeable to participate in OT session. Reports 0/10 pain level.  Patient participated in skilled OT session focusing on ADL participation, standing balance, UE strength and coordination, B/L coordination, and attention deficits. Patient completed toileting in standing with max A for balance and clothing management. Patient completed standing activity working on divided attention for UE and LE strengthening. Patient able to demonstrate fair - coordination to grab items and pull off table. Patient able to utilize LUE as stabilizer with min A. Therapist elicited increased motor strength in LUE and RUE to complete bilateral task to promote increased success in ADL tasks.     Therapy Documentation Precautions:  Precautions Precautions: Fall Recall of Precautions/Restrictions: Impaired Precaution/Restrictions Comments: L hemipareisis, permissive HTN, HIV (+) Restrictions Weight Bearing Restrictions Per Provider Order: No   Therapy/Group: Individual Therapy  D'mariea L Mohamedamin Nifong OTR/L  03/03/2024, 7:55 AM

## 2024-03-03 NOTE — Progress Notes (Signed)
 Patient ID: Juan Townsend, male   DOB: [birth year], 47 y.o.   MRN: [id number] S: No new complaints O:BP 127/86   Pulse 73   Temp 98.5 F (36.9 C) (Oral)   Resp 18   Ht 5' 6 (1.676 m)   Wt 58.2 kg   SpO2 100%   BMI 20.71 kg/m   Intake/Output Summary (Last 24 hours) at 03/03/2024 1103 Last data filed at 03/03/2024 0700 Gross per 24 hour  Intake 836 ml  Output 850 ml  Net -14 ml   Intake/Output: I/O last 3 completed shifts: In: 836 [P.O.:836] Out: 1150 [Urine:1150]  Intake/Output this shift:  No intake/output data recorded. Weight change: -0.4 kg Gen: NAD CVS: RRR Resp:CTA Abd: +BS, soft, NT/ND Ext: no edema  Recent Labs  Lab 02/26/24 0503 02/27/24 1038 02/28/24 0625 02/29/24 0714 03/01/24 0608 03/02/24 0517 03/02/24 1958 03/03/24 0459  NA 134* 136 137 136 134* 136 139 137  K 3.7 3.8 3.5 3.9 4.1 3.7 4.1 3.9  CL 101 100 101 100 103 106 106 105  CO2 25 25 24 28 28 27 25 28   GLUCOSE 94 120* 149* 111* 94 98 108* 95  BUN 26* 27* 30* 26* 32* 30* 34* 32*  CREATININE 2.86* 2.93* 3.19* 3.03* 3.16* 2.78* 2.72* 2.75*  ALBUMIN 3.3* 3.5 3.3* 3.3* 3.2* 3.0*  --  3.0*  CALCIUM  8.9 9.5 9.0 9.2 8.6* 8.6* 9.0 8.7*  PHOS  --  3.9 4.0 3.5 4.3 4.0  --  4.3  AST 22  --   --   --   --   --   --   --   ALT 21  --   --   --   --   --   --   --    Liver Function Tests: Recent Labs  Lab 02/26/24 0503 02/27/24 1038 03/01/24 0608 03/02/24 0517 03/03/24 0459  AST 22  --   --   --   --   ALT 21  --   --   --   --   ALKPHOS 39  --   --   --   --   BILITOT 1.1  --   --   --   --   PROT 7.7  --   --   --   --   ALBUMIN 3.3*   < > 3.2* 3.0* 3.0*   < > = values in this interval not displayed.   No results for input(s): LIPASE, AMYLASE in the last 168 hours. No results for input(s): AMMONIA in the last 168 hours. CBC: Recent Labs  Lab 02/26/24 0503 03/02/24 1958  WBC 5.3 6.5  NEUTROABS 3.1  --   HGB 9.7* 8.6*  HCT 28.5* 26.1*  MCV 89.9 93.9  PLT 224 431*   Cardiac  Enzymes: No results for input(s): CKTOTAL, CKMB, CKMBINDEX, TROPONINI in the last 168 hours. CBG: Recent Labs  Lab 02/25/24 1130  GLUCAP 108*    Iron Studies: No results for input(s): IRON, TIBC, TRANSFERRIN, FERRITIN in the last 72 hours. Studies/Results: No results found.  amLODipine   10 mg Oral Daily   aspirin  EC  81 mg Oral Daily   atorvastatin   20 mg Oral Q supper   bictegravir-emtricitabine -tenofovir  AF  1 tablet Oral Daily   calcium  carbonate  1 tablet Oral Q lunch   clopidogrel   75 mg Oral Daily   enoxaparin  (LOVENOX ) injection  30 mg Subcutaneous Q24H   hydrALAZINE   100 mg Oral Q8H   metoprolol   tartrate  50 mg Oral BID   nicotine   21 mg Transdermal Daily   mouth rinse  15 mL Mouth Rinse 4 times per day   pantoprazole   40 mg Oral Daily   traZODone   50 mg Oral QHS    BMET    Component Value Date/Time   NA 137 03/03/2024 0459   K 3.9 03/03/2024 0459   CL 105 03/03/2024 0459   CO2 28 03/03/2024 0459   GLUCOSE 95 03/03/2024 0459   BUN 32 (H) 03/03/2024 0459   CREATININE 2.75 (H) 03/03/2024 0459   CREATININE 1.24 10/17/2023 1622   CALCIUM  8.7 (L) 03/03/2024 0459   GFRNONAA 28 (L) 03/03/2024 0459   CBC    Component Value Date/Time   WBC 6.5 03/02/2024 1958   RBC 2.78 (L) 03/02/2024 1958   HGB 8.6 (L) 03/02/2024 1958   HCT 26.1 (L) 03/02/2024 1958   PLT 431 (H) 03/02/2024 1958   MCV 93.9 03/02/2024 1958   MCH 30.9 03/02/2024 1958   MCHC 33.0 03/02/2024 1958   RDW 13.4 03/02/2024 1958   LYMPHSABS 1.3 02/26/2024 0503   MONOABS 0.7 02/26/2024 0503   EOSABS 0.1 02/26/2024 0503   BASOSABS 0.0 02/26/2024 0503    Assessment/Recommendations: Author Hatlestad is a/an 48 y.o. male with  hypertension and HIV, admitted for CVA complicated by AKI.        Non-Oliguric AKI on possible CKD: Baseline creatinine hard to define.  Could be between 1 and 1.6 or even higher.  Creatinine elevated here and slowly rising.  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  Other forms of TMA are also possible but seem less likely because the thrombocytopenia improved quickly with controlling the blood pressure.  This would also fit with preponderance for CVA.  Scr is starting to slowly improve.  -  urine pretty bland-   US  does show smaller left kidney but normal echogenicity   - have sent genetic studies for atypical HUS to be on the safe side - Leave blood pressure where it is for now but then improved control with time as his creatinine stabilizes   No compelling need for HD-  crt now stable around 2.7-  no changes for now-  UA remains bland, UPC 280 mg/g.  Nothing further to add.  Will sign off for now and continue to follow his labs remotely.  Please call with any questions or concerns.  Continue to follow Scr weekly as ordered.   Thrombocytopenia: Present on arrival.  Improved with conservative management.  Likely related to hypertension but sending atypical HUS genetic studies as well.  Consider further workup if it returns   Hypertension: Blood pressure overall improved.  Continue current medications amlodipine  10, hydral 100 and metoprolol  50 - I will check orthostatics to make sure BP is not getting too low    CVA: Continue management per primary team   HIV: Home medications per primary team  [name], MD [facility]

## 2024-03-03 NOTE — Progress Notes (Signed)
 PROGRESS NOTE   Subjective/Complaints:  BPs improved, cont BM with PT this am , no pain c/os  Weekend coverage ordered IVF, will hold tonight and ask for nephro input      03/03/2024    6:07 AM 03/03/2024    5:03 AM 03/02/2024    9:14 PM  Vitals with BMI  Weight  128 lbs 5 oz   BMI  20.72   Systolic 127 127 862  Diastolic 86 86 104  Pulse  73       Objective:   No results found. Recent Labs    03/02/24 1958  WBC 6.5  HGB 8.6*  HCT 26.1*  PLT 431*    Recent Labs    03/02/24 1958 03/03/24 0459  NA 139 137  K 4.1 3.9  CL 106 105  CO2 25 28  GLUCOSE 108* 95  BUN 34* 32*  CREATININE 2.72* 2.75*  CALCIUM  9.0 8.7*    Intake/Output Summary (Last 24 hours) at 03/03/2024 0846 Last data filed at 03/03/2024 0700 Gross per 24 hour  Intake 836 ml  Output 850 ml  Net -14 ml        Physical Exam: Vital Signs Blood pressure 127/86, pulse 73, temperature 98.5 F (36.9 C), temperature source Oral, resp. rate 18, height 5' 6 (1.676 m), weight 58.2 kg, SpO2 100%.   General: No acute distress.  Sitting up in bedside chair Mood and affect are appropriate Heart: Regular rate and rhythm no rubs murmurs or extra sounds Lungs: Clear to auscultation, breathing unlabored, no rales or wheezes Abdomen: Positive bowel sounds, soft nontender to palpation, nondistended Extremities: No clubbing, cyanosis, or edema Skin: No evidence of breakdown, no evidence of rash  Neurologic: Awake, alert, oriented x 4. Cranial nerves II through XII intact,  motor strength is 5/5 on the right 3- left delt, bi, tri grip, 3- knee ext 0/5 at foot and ankle Sensory exam normal sensation to light touch and proprioception in bilateral upper and reduced LT lower extremities  Musculoskeletal: Full range of motion in all 4 extremities. No joint swelling  Physical exam unchanged from the above on reexamination 03/03/24     Assessment/Plan: 1. Functional deficits which require 3+ hours per day of interdisciplinary therapy in a comprehensive inpatient rehab setting. Physiatrist is providing close team supervision and 24 hour management of active medical problems listed below. Physiatrist and rehab team continue to assess barriers to discharge/monitor patient progress toward functional and medical goals  Care Tool:  Bathing    Body parts bathed by patient: Left arm, Chest, Abdomen, Front perineal area, Right upper leg, Left upper leg, Face, Left lower leg, Right lower leg   Body parts bathed by helper: Right arm, Buttocks     Bathing assist Assist Level: Minimal Assistance - Patient > 75%     Upper Body Dressing/Undressing Upper body dressing   What is the patient wearing?: Pull over shirt    Upper body assist Assist Level: Minimal Assistance - Patient > 75%    Lower Body Dressing/Undressing Lower body dressing      What is the patient wearing?: Incontinence brief, Pants     Lower body assist Assist for  lower body dressing: Moderate Assistance - Patient 50 - 74%     Toileting Toileting    Toileting assist Assist for toileting: Maximal Assistance - Patient 25 - 49%     Transfers Chair/bed transfer  Transfers assist  Chair/bed transfer activity did not occur: Safety/medical concerns  Chair/bed transfer assist level: Moderate Assistance - Patient 50 - 74%     Locomotion Ambulation   Ambulation assist      Assist level: Maximal Assistance - Patient 25 - 49% Assistive device: Hand held assist (+2) Max distance: 30 ft   Walk 10 feet activity   Assist     Assist level: Maximal Assistance - Patient 25 - 49% Assistive device: Hand held assist (+2)   Walk 50 feet activity   Assist Walk 50 feet with 2 turns activity did not occur: Safety/medical concerns         Walk 150 feet activity   Assist Walk 150 feet activity did not occur: Safety/medical concerns          Walk 10 feet on uneven surface  activity   Assist Walk 10 feet on uneven surfaces activity did not occur: Safety/medical concerns         Wheelchair     Assist Is the patient using a wheelchair?: Yes Type of Wheelchair: Manual    Wheelchair assist level: Dependent - Patient 0% Max wheelchair distance: 250 ft    Wheelchair 50 feet with 2 turns activity    Assist        Assist Level: Dependent - Patient 0%   Wheelchair 150 feet activity     Assist      Assist Level: Dependent - Patient 0%   Blood pressure 127/86, pulse 73, temperature 98.5 F (36.9 C), temperature source Oral, resp. rate 18, height 5' 6 (1.676 m), weight 58.2 kg, SpO2 100%.  Medical Problem List and Plan: 1. 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  Left M3 and L A3 severe stenosis              -patient may shower             -ELOS/Goals:  10-14 days, goals supervision for mobility, self-care, and cognition, communication, team conf in am              -pt with emerging hypertonicity in the LLE. Will order PRAFO to use at night  - Stable to continue inpatient rehab  2.  Antithrombotics: -DVT/anticoagulation:  Pharmaceutical: Lovenox              -antiplatelet therapy: DAPT X 3 weeks followed by ASA alone.  3. Pain Management: Tylenol  prn.  4. Mood/Behavior/Sleep: LCSW to follow for evaluation and support.              -antipsychotic agents: N/A--agitation has resolved.  5. Neuropsych/cognition: This patient is capable of making decisions on his own behalf. 6. Skin/Wound Care: Routine pressure relief measures.  7. Fluids/Electrolytes/Nutrition: Monitor I/O. Check CMET in am.  8.  HTN: Monitor BP TID and slowly  normalize over next few week per nephrology             --on Amlodipine  10, metoprolol  50 mg bid and hydralazine  100 mg every 8 hrs. Vitals:   03/03/24 0503 03/03/24 0607  BP: 127/86  127/86  Pulse: 73   Resp: 18   Temp: 98.5 F (36.9 C)   SpO2: 100%    6-21: Blood  pressure overall improved, continue amlodipine  10 mg, hydralazine  100 mg, metoprolol  50 mg per nephrology.  Appreciate their assistance 6-24 improved  9. Dysphagia: D3, nectar (06/16) with head turn to left for all liquids. --Full supervision for safety and aspiration precautions.   10. 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  Sent studies for atypical HUS.      Latest Ref Rng & Units 03/03/2024    4:59 AM 03/02/2024    7:58 PM 03/02/2024    5:17 AM  BMP  Glucose 70 - 99 mg/dL 95  891  98   BUN 6 - 20 mg/dL 32  34  30   Creatinine 0.61 - 1.24 mg/dL 7.24  7.27  7.21   Sodium 135 - 145 mmol/L 137  139  136   Potassium 3.5 - 5.1 mmol/L 3.9  4.1  3.7   Chloride 98 - 111 mmol/L 105  106  106   CO2 22 - 32 mmol/L 28  25  27    Calcium  8.9 - 10.3 mg/dL 8.7  9.0  8.6      11. HIV disease: Continue BIKTARVY  50-200-25. Now followed by Dr. CHRISTELLA. Singh.  12. Anemia of chronic disease: Likely due to HIV/CKD. Will recheck in am.  13. Thrombocytopenia: Platelets 51 at admission. Now up to 147.  --Monitor for recovery 14. Hypocalcemia: Ionized calcium  1.08. on supplement, corrected to 9.0 when factoring in low alb 15. Hyponatremia: mild/borderline, improved   16. Tobacco use: Nicotine  patch. Encourage nicotine  cessation.   LOS: 7 days A FACE TO FACE EVALUATION WAS PERFORMED  Prentice FORBES Compton 03/03/2024, 8:46 AM

## 2024-03-04 NOTE — Progress Notes (Addendum)
 PROGRESS NOTE   Subjective/Complaints: Working with PT this am   Weekend coverage ordered IVF, will hold tonight and ask for nephro input      03/04/2024    6:10 AM 03/04/2024    6:06 AM 03/04/2024    5:00 AM  Vitals with BMI  Weight   125 lbs  BMI   20.19  Systolic 142 142   Diastolic 84 84   Pulse 71        Objective:   No results found. Recent Labs    03/02/24 1958  WBC 6.5  HGB 8.6*  HCT 26.1*  PLT 431*    Recent Labs    03/02/24 1958 03/03/24 0459  NA 139 137  K 4.1 3.9  CL 106 105  CO2 25 28  GLUCOSE 108* 95  BUN 34* 32*  CREATININE 2.72* 2.75*  CALCIUM  9.0 8.7*    Intake/Output Summary (Last 24 hours) at 03/04/2024 0913 Last data filed at 03/04/2024 0745 Gross per 24 hour  Intake 876 ml  Output 350 ml  Net 526 ml        Physical Exam: Vital Signs Blood pressure (!) 142/84, pulse 71, temperature 98.6 F (37 C), temperature source Oral, resp. rate 18, height 5' 6 (1.676 m), weight 56.7 kg, SpO2 100%.   General: No acute distress.  Sitting up in bedside chair Mood and affect are appropriate Heart: Regular rate and rhythm no rubs murmurs or extra sounds Lungs: Clear to auscultation, breathing unlabored, no rales or wheezes Abdomen: Positive bowel sounds, soft nontender to palpation, nondistended Extremities: No clubbing, cyanosis, or edema Skin: No evidence of breakdown, no evidence of rash  Neurologic: Awake, alert, oriented x 4. Cranial nerves II through XII intact,  motor strength is 5/5 on the right 3- left delt, bi, tri grip, 3- knee ext 0/5 at foot and ankle Sensory exam normal sensation to light touch and proprioception in bilateral upper and reduced LT lower extremities  Musculoskeletal: Full range of motion in all 4 extremities. No joint swelling  Physical exam unchanged from the above on reexamination 03/04/24    Assessment/Plan: 1. Functional deficits which require 3+  hours per day of interdisciplinary therapy in a comprehensive inpatient rehab setting. Physiatrist is providing close team supervision and 24 hour management of active medical problems listed below. Physiatrist and rehab team continue to assess barriers to discharge/monitor patient progress toward functional and medical goals  Care Tool:  Bathing    Body parts bathed by patient: Left arm, Chest, Abdomen, Front perineal area, Right upper leg, Left upper leg, Face, Left lower leg, Right lower leg   Body parts bathed by helper: Right arm, Buttocks     Bathing assist Assist Level: Minimal Assistance - Patient > 75%     Upper Body Dressing/Undressing Upper body dressing   What is the patient wearing?: Pull over shirt    Upper body assist Assist Level: Minimal Assistance - Patient > 75%    Lower Body Dressing/Undressing Lower body dressing      What is the patient wearing?: Incontinence brief, Pants     Lower body assist Assist for lower body dressing: Moderate Assistance - Patient 50 -  74%     Toileting Toileting    Toileting assist Assist for toileting: Maximal Assistance - Patient 25 - 49% (standing toilet use and clothing management)     Transfers Chair/bed transfer  Transfers assist  Chair/bed transfer activity did not occur: Safety/medical concerns  Chair/bed transfer assist level: Moderate Assistance - Patient 50 - 74%     Locomotion Ambulation   Ambulation assist      Assist level: Maximal Assistance - Patient 25 - 49% Assistive device: Hand held assist (+2) Max distance: 30 ft   Walk 10 feet activity   Assist     Assist level: Maximal Assistance - Patient 25 - 49% Assistive device: Hand held assist (+2)   Walk 50 feet activity   Assist Walk 50 feet with 2 turns activity did not occur: Safety/medical concerns         Walk 150 feet activity   Assist Walk 150 feet activity did not occur: Safety/medical concerns         Walk 10 feet  on uneven surface  activity   Assist Walk 10 feet on uneven surfaces activity did not occur: Safety/medical concerns         Wheelchair     Assist Is the patient using a wheelchair?: Yes Type of Wheelchair: Manual    Wheelchair assist level: Dependent - Patient 0% Max wheelchair distance: 250 ft    Wheelchair 50 feet with 2 turns activity    Assist        Assist Level: Dependent - Patient 0%   Wheelchair 150 feet activity     Assist      Assist Level: Dependent - Patient 0%   Blood pressure (!) 142/84, pulse 71, temperature 98.6 F (37 C), temperature source Oral, resp. rate 18, height 5' 6 (1.676 m), weight 56.7 kg, SpO2 100%.  Medical Problem List and Plan: 1. 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 Will order PRAFO to use at night  - Stable to continue inpatient rehab Team conference today please see physician documentation under team conference tab, met with team  to discuss problems,progress, and goals. Formulized individual treatment plan based on medical history, underlying problem and comorbidities.\ 2.  Antithrombotics: -DVT/anticoagulation:  Pharmaceutical: Lovenox  may d/c amb 100'             -antiplatelet therapy: DAPT X 3 weeks followed by ASA alone.  3. Pain Management: Tylenol  prn.  4. Mood/Behavior/Sleep: LCSW to follow for evaluation and support.              -antipsychotic agents: N/A--agitation has resolved.  5. Neuropsych/cognition: This patient is capable of making decisions on his own behalf. 6. Skin/Wound Care: Routine pressure relief measures.  7. Fluids/Electrolytes/Nutrition: Monitor I/O. Check CMET in am.   8.  HTN: Monitor BP TID and slowly  normalize over next few week per nephrology             --on Amlodipine  10, metoprolol  50 mg bid and hydralazine  100 mg every 8 hrs. Vitals:   03/04/24 0606 03/04/24  0610  BP: (!) 142/84 (!) 142/84  Pulse:  71  Resp:  18  Temp:  98.6 F (37 C)  SpO2:  100%   6-25: Blood pressure overall improved, continue amlodipine  10 mg, hydralazine  100 mg, metoprolol  50 mg per nephrology.  Appreciate their assistance   9. Dysphagia: D3, nectar (06/16) with head turn to left for all liquids. --Full supervision for safety and aspiration precautions.   10. 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  Sent studies for atypical HUS. BUN/Cr still elevated over baseline but no additional tx recommended other  than weekly check.  Will rec Nephro f/u as OP       Latest Ref Rng & Units 03/03/2024    4:59 AM 03/02/2024    7:58 PM 03/02/2024    5:17 AM  BMP  Glucose 70 - 99 mg/dL 95  891  98   BUN 6 - 20 mg/dL 32  34  30   Creatinine 0.61 - 1.24 mg/dL 7.24  7.27  7.21   Sodium 135 - 145 mmol/L 137  139  136   Potassium 3.5 - 5.1 mmol/L 3.9  4.1  3.7   Chloride 98 - 111 mmol/L 105  106  106   CO2 22 - 32 mmol/L 28  25  27    Calcium  8.9 - 10.3 mg/dL 8.7  9.0  8.6      11. HIV disease: Continue BIKTARVY  50-200-25. Now followed by Dr. CHRISTELLA. Singh.  12. Anemia of chronic disease: Likely due to HIV/CKD. Will recheck in am.  13. Thrombocytopenia: Platelets 51 at admission. Now up to 147.  --Monitor for recovery 14. Hypocalcemia: Ionized calcium  1.08. on supplement, corrected to 9.0 when factoring in low alb 15. Hyponatremia: mild/borderline, improved   16. Tobacco use: Nicotine  patch. Encourage nicotine  cessation.   LOS: 8 days A FACE TO FACE EVALUATION WAS PERFORMED  Juan Townsend 03/04/2024, 9:13 AM

## 2024-03-04 NOTE — Progress Notes (Signed)
 Speech Language Pathology Daily Session Note  Patient Details  Name: Juan Townsend MRN: [id number] Date of Birth: 1976-01-12  Today's Date: 03/04/2024 SLP Individual Time: 1115-1200 SLP Individual Time Calculation (min): 45 min  Short Term Goals: Week 2: SLP Short Term Goal 1 (Week 2): STGs=LTGs d/t ELOS  Skilled Therapeutic Interventions:   Pt greeted at bedside, awake/alert upon SLP arrival. He was very pleasant and cooperative throughout tx tasks targeting cognition. He was able to recall broad details re upcoming MBSS 6/26 w/ minA cues but required modA for more detailed information. SLP provided additional education re MBSS procedure and purpose for instrumental imaging d/t recall deficits. He then completed written tasks x2 targeting organization, sustained attention, emergent awareness, and mildly complex problem solving. He completed the initial one independently w/ additional processing time. However, minA required for attention to detail and emergent awareness during the second task. Adequate sustained attention noted throughout both tasks. At the end of tx session, he was left in his bed with the alarm set and call light within reach. Recommend cont ST per POC.   Pain  No pain reported   Therapy/Group: Individual Therapy  Juan Townsend 03/04/2024, 2:56 PM

## 2024-03-04 NOTE — Progress Notes (Signed)
 Speech Language Pathology Daily Session Note  Patient Details  Name: Quadry Kampa MRN: [id number] Date of Birth: [birth year]  Today's Date: 03/04/2024 SLP Individual Time: 0735-0829 SLP Individual Time Calculation (min): 54 min  Short Term Goals: Week 2: SLP Short Term Goal 1 (Week 2): STGs=LTGs d/t ELOS  Skilled Therapeutic Interventions: SLP conducted skilled therapy session targeting cognitive and dysphagia goals. SLP began by assessing tolerance of current Dys3/NTL diet with breakfast tray. Patient exhibited swift mastication of all solids and no overt s/sx of penetration/aspiration throughout any consistency. Patient utilized L head turn strategy consistently with supervision. After completion of meal, SLP facilitated pharyngeal strengthening exercises. Patient completed CTAR series x3, 10x Masako, and 10x effortful swallow with supervision cues for accuracy. SLP then provided patient with thin liquids via straw. Across trials, patient exhibited immediate cough x2 when prompted to swallow without head turn, which may suggest improvements in sensation. With L head turn strategy employed, exhibited immediate cough x1 with first swallow only, and then no further overt s/sx of airway invasion, though hx of silent aspiration, thus clinical indicators are not reliable for determination of aspiration. Recommend upgrade to regular solids and will plan to repeat MBS to determine ability to upgrade to thin liquids given silent aspiration on MBS exhibited on 6/13. Cognitively, patient recalled 3/3 pharyngeal strengthening exercises with supervision. He benefited from min cues to sustain attention during CTAR exercise. SLP facilitated basic problem solving tasks. When presented in written format, patient completed with supervision. When presented verbally, patient required mod to max assist for working memory and sustained attention. Patient was left in room with call bell in reach and alarm set. SLP will  continue to target goals per plan of care.        Pain Pain Assessment Pain Scale: 0-10 Pain Score: 0-No pain  Therapy/Group: Individual Therapy  Cacie Gaskins, M.A., CCC-SLP  Hubert Derstine A Karlin Heilman 03/04/2024, 8:29 AM

## 2024-03-04 NOTE — Progress Notes (Signed)
 Occupational Therapy Session Note  Patient Details  Name: Juan Townsend MRN: [id number] Date of Birth: 09-20-1975  Today's Date: 03/04/2024 OT Individual Time: 1300-1355 OT Individual Time Calculation (min): 55 min    Short Term Goals: Week 2:  OT Short Term Goal 1 (Week 2): patient will utilize LUE as stabilizer in daily activities with CGA OT Short Term Goal 2 (Week 2): patient will demonstrate ability to perform transfers to wc, toilet, shower with CGA OT Short Term Goal 3 (Week 2): patient will demonstrate increased standing balance evidenced by ability to don pants with min A in sititng/ standing  Skilled Therapeutic Interventions/Progress Updates:     Pt received reclined in bed dressed for the day upon OT arrival. Pt presenting with flat affect receptive to skilled OT session reporting 0/10 pain- OT offering intermittent rest breaks, repositioning, and therapeutic support to optimize participation in therapy session. Pt politely declining need for shower this session.   Pt transitioned to EOB with HOB elevated with light min A required to fully lift trunk from [facility] +verbal cues for technique d/t motor planning deficits. Partial stand pivot EOB > wc to R light min A to guide hips to w/c +verbal cues for set-up and technique. Transported Pt total A to therapy gym in wc for time management. Positioned Pt at high/low table in w/c with B LEs positioned on floor. Engaged Pt in completing table glides with L UE positioned on towel for gravity decreased positioning to improve overall functional use for ADLs. Pt completed pushing/pulling (shoulder protraction/retraction, scapular circumduction (clockwise/counter clockwise), isolated elbow flexion/extension, and shoulder abduction/adduction with OT providing consistent verbal  and tactile cues for muscle activation, body alignment, and to improve breathing pattern during functional movements- min shoulder elevation and compensatory trunk rotation  noted.   Engaged Pt in completing functional movement pattern maintained grasp activity to improve L UE functional use. Pt tasked with using L Ue to retrieve wash cloth from table top, bring wash cloth to his face, and then drop wash cloth onto floor facilitating gravity assisted digit extension. Pt completed 15 trials of this for blocked practice to improve motor learning with light min A required to move through full ROM during elbow flexion and to facilitate digit extension when initiating grasp.   Progressed NMRE activities to incorporate dynamic standing and dual tasking challenge- Pt instructed to balance at elevated table top with L UE positioned on table to facilitate WB'ing while using R UE to reach across midline to retrieve and transport cones to opposite side of table. Min A required for dynamic standing balance and L LE stabilization. Pt then reversed task using L UE to reach across midline to retrieve cones with min A required for elbow flexion and maintained grasp when retrieving cones. Pt fatigued at end of activity with increased clonus in LE noted.   Transported Pt back to room total A in wc. Stand pivot wc > EOB using bed rail light min A. Pt was left resting in bed with call bell in reach, bed alarm on, and all needs met.    Therapy Documentation Precautions:  Precautions Precautions: Fall Recall of Precautions/Restrictions: Impaired Precaution/Restrictions Comments: L hemipareisis, permissive HTN, HIV (+) Restrictions Weight Bearing Restrictions Per Provider Order: No   Therapy/Group: Individual Therapy  Katheryn SHAUNNA [place]ines 03/04/2024, 1:34 PM

## 2024-03-04 NOTE — Progress Notes (Addendum)
 Patient ID: Juan Townsend, male   DOB: 08-08-1976, 48 y.o.   MRN: [id number] Attempted to call pt's brother and now it says call not be completed as dialed. Not sure if changed number or having phone issues. Once pt returns to room will ask him and update him regarding team conference  2:40 PM Spoke with pt to give him the team conference update regarding goals of supervision and target discharge date of 7/9. He reports his brother's phone is dead and to call [phone] to let her know update. Pt feels he is doing better and making progress in his therapies. He hopes after MS tomorrow he can be advanced to thin liquids. Will call Levon to let know discharge date.

## 2024-03-05 ENCOUNTER — Inpatient Hospital Stay (HOSPITAL_COMMUNITY): Payer: Self-pay

## 2024-03-05 NOTE — Progress Notes (Signed)
 Physical Therapy Session Note  Patient Details  Name: Juan Townsend MRN: [id number] Date of Birth: 1976/05/14  Today's Date: 03/04/2024 PT Individual Time: 0916-1005 PT Individual Time Calculation (min): 49 min  Short Term Goals: Week 1:  PT Short Term Goal 1 (Week 1): Pt will perform bed mobility with overall consistent CGA. PT Short Term Goal 2 (Week 1): Pt will perform sit<>stand to no AD with MinA. PT Short Term Goal 3 (Week 1): Pt will perform stand pivot transfers with consistent ModA. PT Short Term Goal 4 (Week 1): pt will perform squat pivot transfers with consistent CGA. PT Short Term Goal 5 (Week 1): Pt will ambulate 50 ft with ModA from therapist and CGA from +2.  Skilled Therapeutic Interventions/Progress Updates:  Patient supine in bed on entrance to room. Patient alert and agreeable to PT session.   Patient with no pain complaint at start of session.  Pt noted to be incontinent of urine.   Therapeutic Activity: Bed Mobility: Pt performed supine > sit with HOB elevated, uses bedrail and able to reach seated position EOB with close supervision/ CGA. Cues required for increased lean to R and forward in order to put R foot on floor as pt continues to lose balance posteriorly with attempt to raise RLE for momentum build.  Transfers: Pt performed squat pivot transfer to w/c requiring vc for setup of reaching across w/c to armrest and forward lean with BLE extensor push instead of pulling on w/c. Completes with vc and CGA otherwise would need ModA.   Pt taken to bathroom in w/c and paritally doffed pants with MaxA while standing at safety rail. After sitting to toilet, pt able to doff shirt with supervision and don new shirt with MinA. Provided with washcloth and pt performed frontal pericare and washup with supervision, then stood at rail and provided with MinA for balance while using RUE to wash backside. Assisted with donning brief and pants Mod/ MaxA.   Sit<>stand transfers  with CGA/ near supervision with proper setup and max cues. Improves with NMR training but little carryover noted with transfer back to bed at end of session requiring minA to control descent to sit on EOB. Stand pivot transfers throughout session with CGA/ light MinA intermittently during turns in ambulation bouts. Provided vc/ tc throughout session for correct setup prior to rise to stand and for controlling descent to sit.   Gait Training:  Pt ambulated 95' x1 incuding 180* turn/ 37' x2 using RW with L hand saddle spint and overall CGA/MinA for balance with need for intermittent MinA to prevent LOB. Demonstrated R trunk lean and holding head in R lateral cervical flexion. Also demo'd adduction of LLE in swing phase with difficulty in hip/ knee flexion and almost ataxic/ slow clonic bounce in L knee during stance phase when attempting to stand with hip/ knee extension - all demo'd especially when fatigued. NDT cueing at trunk for improved lateral weight shift to L side as well as L trunk shortening (QL and lower trap as well as lower thoracic facilitation) and lengthening of R trunk.  Provided vc for stepping L foot more laterally and strong extensor push of foot into floor to attempt to decrease bouncing in stance phase.    Neuromuscular Re-ed: NMR facilitated during session with focus on standing balance and muscle fiber recruitment/ motor control. Pt guided in sequence of preparing body position to correct sit>stands throughout session and again during blocked practice. Pt provided with vc for leaning forward to scoot  fwd, R hand position to w/c armrest, then sufficient fwd lean to reach upright stance with ease. With proper cueing pt can control rise to stand and find initial upright balance with close supervision. Progressed to sit<>stand practice to rolling stool. Pt is able to control descent to sit until final 25% of lowering to seat. Demos increased forward lean to initiate power up from stool with  minimal movement of stool.    NMR performed for improvements in motor control and coordination, balance, sequencing, judgement, and self confidence/ efficacy in performing all aspects of mobility at highest level of independence.   Patient supine in bed at end of session with brakes locked, bed alarm set, and all needs within reach.   Therapy Documentation Precautions:  Precautions Precautions: Fall Recall of Precautions/Restrictions: Impaired Precaution/Restrictions Comments: L hemipareisis, permissive HTN, HIV (+) Restrictions Weight Bearing Restrictions Per Provider Order: No  Pain: Pain Assessment Pain Scale: 0-10 Pain Score: 0-No pain related this session.    Therapy/Group: Individual Therapy  [name] PT, DPT, CSRS 03/05/2024, 6:10 AM

## 2024-03-05 NOTE — Progress Notes (Signed)
 Occupational Therapy Session Note  Patient Details  Name: Juan Townsend MRN: [id number] Date of Birth: 03-Jan-1976  Today's Date: 03/05/2024  Session 1:  OT Individual Time: 1030-1045 OT Individual Time Calculation (min): 15 min  and Today's Date: 03/05/2024 OT Missed Time: 15 Minutes Missed Time Reason: Unavailable (comment) (Pt off unit for swallow study)  Session 2:  OT Individual Time: 8699-8654 OT Individual Time Calculation (min): 45 min    Session 3:  OT Individual Time: 1449-1530 OT Individual Time Calculation (min): 41 min   Short Term Goals: Week 2:  OT Short Term Goal 1 (Week 2): patient will utilize LUE as stabilizer in daily activities with CGA OT Short Term Goal 2 (Week 2): patient will demonstrate ability to perform transfers to wc, toilet, shower with CGA OT Short Term Goal 3 (Week 2): patient will demonstrate increased standing balance evidenced by ability to don pants with min A in sititng/ standing  Skilled Therapeutic Interventions/Progress Updates:     Session 1: Missed 15 minutes skilled OT treatment d/t Pt out of his room for swallow study. Pt received semi-reclined in bed presenting with flat affect receptive to skilled OT session reporting 0/10 pain- OT offering intermittent rest breaks, repositioning, and therapeutic support to optimize participation in therapy session. Pt requesting to use urinal for continent void during session. Provided education on hemi-techniques for clothing management at bed level and education on urinal positioning to avoid spillage with pt receptive to education. Engaged Pt in managing his clothes and urinal placement to increase independence in toileting tasks- mod A required overall d/t first learning experience, however Pt motivated to continue practicing using urinal to become independent in this. Issued pt a yellow (light resistance) foam cube to work on L hand functional use with emphasis on grasp and release. Provided education on  simple exercises Pt can complete using yellow cube with Pt receptive to education and demonstrating teach back as evidence of learning. Pt was left resting in bed with call bell in reach, bed alarm on, and all needs met.    Session 2: Pt received semi-reclined in bed presenting with flat affect receptive to skilled OT session reporting 0/10 pain- OT offering intermittent rest breaks, repositioning, and therapeutic support to optimize participation in therapy session. Focused this session on L UE NRME to increase overall functional use for ADLs. Pt transitioned to EOB with HOB elevated CGA and maintained sitting balance EOB SUP with OT donned shoes total A for time management. Squat pivot EOB > wc to R light min A to guide hips and min verbal cues for technique. Transported Pt to therapy gym total A via wc. Engaged Pt in completing series of NMRE actives on Motus Nova UE device with biofeedback provided to support improved muscle activation. Utilized series of electronic video games as motivation for Pt in which Pt was tasked with moving through wrist flexion/extension and intermittent isometric holds to move video game controller. Pt tolerated completing >30 reps total with rest breaks provided PRN. Progress activities to incorporate functional reaching and sustained grasp.Pt tasked with utilizing L UE to retrieve horse shoes reaching inferiorly and maintain grasp on horse shoe while bringing it to chest and then donning it over edge of chair facilitating cylindrical grasp pattern and inferior/anterior reaching. Pt completed total of 8 reps with min HOH A provided to facilitate improved functional movement patterns and mod multimodal cues to decrease compensatory shoulder elevation and trunk flexion. Pt the reversed task to doff horse shoes from chair with  increased challenge noted lifting horse shoe off back of chair, however improved voluntary release notes. Transported Pt back to room total A in wc. Pt was left  resting in wc with call bell in reach, seatbelt alarm on, and all needs met.    Session 3: Pt received sitting up in wc presenting to be in good spirits receptive to skilled OT session reporting 0/10 pain- OT offering intermittent rest breaks, repositioning, and therapeutic support to optimize participation in therapy session. Pt requesting to take shower- focused this session on ADL retraining. Transported Pt to bathroom total A in wc for energy conservation. Stand pivot wc > shower chair using grab bar B UEs on grab bar min A for balance. Pt doffed pants from waist in standing with CGA and doffed shirt in seated position SUP. Majority of bathing tasks completed in seated position for energy conservation and safety. Pt with improved dynamic sitting balance this session with close SUP provided for safety. Pt able to utilize L UE to wash R underarm and R UE with min-mod HOH. Facilitated neuro re-ed to L UE by engaging it in bathing task and utilizing it for showerhead management with min-mod HOH A required to maintain grasp and sufficiently utilize shower head during shower. Pt dried self in seated position min A. Stand pivot using B UE support on grab bar to wc min A. U/LB dressing completed seated in wc with pt able to donn shirt with SUP +increased time and pants min A to bring pants to waist and CGA provided to maintain B LEs in figure-four when weaving feet. Pt stood to RW to bring pants to waist min A using R UE hooked on waist band of pants and min A for balance. Pt completed short distance functional mobility to bed ~37ft with min A +mod verbal cues for technique. Eob > supine SUP. Pt was left resting in bed with call bell in reach, bed alarm on, and all needs met.    Therapy Documentation Precautions:  Precautions Precautions: Fall Recall of Precautions/Restrictions: Impaired Precaution/Restrictions Comments: L hemipareisis, permissive HTN, HIV (+) Restrictions Weight Bearing Restrictions Per  Provider Order: No   Therapy/Group: Individual Therapy  Katheryn SHAUNNA Mines 03/05/2024, 8:01 AM

## 2024-03-05 NOTE — Progress Notes (Addendum)
 PROGRESS NOTE   Subjective/Complaints:  No issues overnite , amb 100' with PT today , clonus at Left ankle noted  Not using PRAFO, encouraged use   ROS- neg CP, SOB, N/V?D      03/05/2024    5:12 AM 03/05/2024    5:00 AM 03/04/2024    8:24 PM  Vitals with BMI  Weight  131 lbs 13 oz   BMI  21.29   Systolic 132  128  Diastolic 94  89  Pulse 72  76      Objective:   No results found. Recent Labs    03/02/24 1958  WBC 6.5  HGB 8.6*  HCT 26.1*  PLT 431*    Recent Labs    03/02/24 1958 03/03/24 0459  NA 139 137  K 4.1 3.9  CL 106 105  CO2 25 28  GLUCOSE 108* 95  BUN 34* 32*  CREATININE 2.72* 2.75*  CALCIUM  9.0 8.7*    Intake/Output Summary (Last 24 hours) at 03/05/2024 0820 Last data filed at 03/05/2024 0730 Gross per 24 hour  Intake 958 ml  Output 1375 ml  Net -417 ml        Physical Exam: Vital Signs Blood pressure (!) 132/94, pulse 72, temperature 99.1 F (37.3 C), temperature source Oral, resp. rate 18, height 5' 6 (1.676 m), weight 59.8 kg, SpO2 98%.   General: No acute distress.  Sitting up in bedside chair Mood and affect are appropriate Heart: Regular rate and rhythm no rubs murmurs or extra sounds Lungs: Clear to auscultation, breathing unlabored, no rales or wheezes Abdomen: Positive bowel sounds, soft nontender to palpation, nondistended Extremities: No clubbing, cyanosis, or edema Skin: No evidence of breakdown, no evidence of rash  Neurologic: Awake, alert, oriented x 4. Cranial nerves II through XII intact,  motor strength is 5/5 on the right 3- left delt, bi, tri grip, 3- knee ext 0/5 at foot and ankle Sensory exam normal sensation to light touch and proprioception in bilateral upper and reduced LT lower extremities TOne- sustained clonus at Left ankle No pain orwith knee ROM, no pain to palp over the hamstrings Musculoskeletal: Full range of motion in all 4 extremities. No  joint swelling  Physical exam unchanged from the above on reexamination 03/05/24    Assessment/Plan: 1. Functional deficits which require 3+ hours per day of interdisciplinary therapy in a comprehensive inpatient rehab setting. Physiatrist is providing close team supervision and 24 hour management of active medical problems listed below. Physiatrist and rehab team continue to assess barriers to discharge/monitor patient progress toward functional and medical goals  Care Tool:  Bathing    Body parts bathed by patient: Left arm, Chest, Abdomen, Front perineal area, Right upper leg, Left upper leg, Face, Left lower leg, Right lower leg   Body parts bathed by helper: Right arm, Buttocks     Bathing assist Assist Level: Minimal Assistance - Patient > 75%     Upper Body Dressing/Undressing Upper body dressing   What is the patient wearing?: Pull over shirt    Upper body assist Assist Level: Minimal Assistance - Patient > 75%    Lower Body Dressing/Undressing Lower body dressing  What is the patient wearing?: Incontinence brief, Pants     Lower body assist Assist for lower body dressing: Moderate Assistance - Patient 50 - 74%     Toileting Toileting    Toileting assist Assist for toileting: Maximal Assistance - Patient 25 - 49% (standing toilet use and clothing management)     Transfers Chair/bed transfer  Transfers assist  Chair/bed transfer activity did not occur: Safety/medical concerns  Chair/bed transfer assist level: Moderate Assistance - Patient 50 - 74%     Locomotion Ambulation   Ambulation assist      Assist level: Maximal Assistance - Patient 25 - 49% Assistive device: Hand held assist (+2) Max distance: 30 ft   Walk 10 feet activity   Assist     Assist level: Maximal Assistance - Patient 25 - 49% Assistive device: Hand held assist (+2)   Walk 50 feet activity   Assist Walk 50 feet with 2 turns activity did not occur: Safety/medical  concerns         Walk 150 feet activity   Assist Walk 150 feet activity did not occur: Safety/medical concerns         Walk 10 feet on uneven surface  activity   Assist Walk 10 feet on uneven surfaces activity did not occur: Safety/medical concerns         Wheelchair     Assist Is the patient using a wheelchair?: Yes Type of Wheelchair: Manual    Wheelchair assist level: Dependent - Patient 0% Max wheelchair distance: 250 ft    Wheelchair 50 feet with 2 turns activity    Assist        Assist Level: Dependent - Patient 0%   Wheelchair 150 feet activity     Assist      Assist Level: Dependent - Patient 0%   Blood pressure (!) 132/94, pulse 72, temperature 99.1 F (37.3 C), temperature source Oral, resp. rate 18, height 5' 6 (1.676 m), weight 59.8 kg, SpO2 98%.  Medical Problem List and Plan: 1. 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 Will order PRAFO to use at night  - Stable to continue inpatient rehab  2.  Antithrombotics: -DVT/anticoagulation:  Pharmaceutical: Lovenox  may d/c amb 100'             -antiplatelet therapy: DAPT X 3 weeks followed by ASA alone.  3. Pain Management: Tylenol  prn.  4. Mood/Behavior/Sleep: LCSW to follow for evaluation and support.              -antipsychotic agents: N/A--agitation has resolved.  5. Neuropsych/cognition: This patient is capable of making decisions on his own behalf. 6. Skin/Wound Care: Routine pressure relief measures.  7. Fluids/Electrolytes/Nutrition: Monitor I/O. Check CMET in am.  8.  HTN: Monitor BP TID and slowly  normalize over next few week per nephrology             --on  Amlodipine  10, metoprolol  50 mg bid and hydralazine  100 mg every 8 hrs. Vitals:   03/04/24 2024 03/05/24 0512  BP: 128/89 (!) 132/94  Pulse: 76 72  Resp: 18 18  Temp: 98.7 F (37.1 C) 99.1 F (37.3 C)  SpO2: 100% 98%   6-25: Blood pressure overall improved, continue amlodipine  10 mg, hydralazine  100 mg, metoprolol  50 mg per nephrology.  Appreciate their assistance   9. Dysphagia: D3, nectar (06/16) with head turn to left for all liquids. --Full supervision for safety and aspiration precautions.   10. 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  Sent studies for atypical HUS. BUN/Cr still elevated over baseline but no additional tx recommended other  than weekly check.  Will rec Nephro f/u as OP       Latest Ref Rng & Units 03/03/2024    4:59 AM 03/02/2024    7:58 PM 03/02/2024    5:17 AM  BMP  Glucose 70 - 99 mg/dL 95  891  98   BUN 6 - 20 mg/dL 32  34  30   Creatinine 0.61 - 1.24 mg/dL 7.24  7.27  7.21   Sodium 135 - 145 mmol/L 137  139  136   Potassium 3.5 - 5.1 mmol/L 3.9  4.1  3.7   Chloride 98 - 111 mmol/L 105  106  106   CO2 22 - 32 mmol/L 28  25  27    Calcium  8.9 - 10.3 mg/dL 8.7  9.0  8.6      11. HIV disease: Continue BIKTARVY  50-200-25. Now followed by Dr. CHRISTELLA. Singh.  12. Anemia of chronic disease: Likely due to HIV/CKD. Will recheck in am.  13. Thrombocytopenia: Platelets 51 at admission. Now up to 147.  --Monitor for recovery 14. Hypocalcemia: Ionized calcium  1.08. on supplement, corrected to 9.0 when factoring in low alb 15. Hyponatremia: mild/borderline, improved   16. Tobacco use: Nicotine  patch. Encourage nicotine  cessation.   LOS: 9 days A FACE TO FACE EVALUATION WAS PERFORMED  Prentice FORBES Compton 03/05/2024, 8:20 AM

## 2024-03-05 NOTE — Progress Notes (Signed)
 Patient ID: Juan Townsend, male   DOB: [birth year], 48 y.o.   MRN: [id number]  have attempted to call Nikki-pt's sister and had the wrong number checked with him again for the right one. Have called her and unable to complete call, will try again later.

## 2024-03-05 NOTE — Patient Care Conference (Signed)
 Inpatient RehabilitationTeam Conference and Plan of Care Update Date: 03/04/2024   Time: 10:38 AM    Patient Name: Juan Townsend      Medical Record Number: [id number]  Date of Birth: 03-22-1976 Sex: Male         Room/Bed: 5T90R/5T90R-98 Payor Info: Payor: /    Admit Date/Time:  02/25/2024  2:36 PM  Primary Diagnosis:  Acute right ACA stroke [facility])  Hospital Problems: Principal Problem:   Acute right ACA stroke [facility]) Active Problems:   Cognitive change    Expected Discharge Date: Expected Discharge Date: 03/18/24  Team Members Present: Physician leading conference: Dr. Prentice Compton Social Worker Present: Rhoda Clement, LCSW Nurse Present: Barnie Ronde, RN PT Present: Recardo Milliner, PT OT Present: Katheryn Mines, OT SLP Present: Blaise Alderman, SLP     Current Status/Progress Goal Weekly Team Focus  Bowel/Bladder   continent x2   pt will remain continent   q4 hour toileting and prn    Swallow/Nutrition/ Hydration   D3/NTL, planning to complete updated MBS 6/26   mod i  pharyngeal swallowing exercises, L headturn    ADL's   min A UB ADLs, mod A LB ADLs, min A bathing, max A toileting, stand pivot min A with verbal cues for technique, improved initation of grasp and digit extension // Barriers: flat affect, needs family ed prior to d/c, cognitive deficits?   Supervision   L NMRE, functional transfers, dynamic balance, therapeutic use of self, motivational interviewing, Pt education, ADL retraining    Mobility   Bed mobility = supervision/ ModI, Transfers = minA, Ambulation up to ~100 ft using RW with L hand saddle splint and MinA. Cued for posture and sequential muscle activation   supervision for transfers, CGA/ MinA for gait  Barriers: urinary incontinence, L hemibody weakness /// Work on: improving standing balance, LOA in transfers, quality of gait, NMR to L hemibody, pt/ family education    Communication                Safety/Cognition/ Behavioral  Observations  mod-severe deficits on evaluation   min-mod   orientation, problem solving, memory, intellectual awareness    Pain   pt not complaining of any pain   pt will remain pain free   pain assessments q shift and prn    Skin   no skin issues   skin will remain intact  skin assessment q shift and prn      Discharge Planning:  Plan to go home with brother and sister in-law have yet to talk with brother to confirm this. Both he and wife work third shift. Pt needs to be as high level as possible due to may be times he is alone while they sleep.   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 Functional progress limited by ease of being overwhelmed and delayed processing.   Patient on target to meet rehab goals: yes, currently needs min assist for upper body care and mod assist for lower body care with min assist for toilet transfers with max assist for toileting. Able to ambulate up to 100' using a RW with min assist for postural control and continues to work on dynamic sitting balance. Appears to do better with written information but continues to work on problem solving and memory. Goals for discharge set for supervision overall.  *See Care Plan and progress notes for long and short-term goals.   Revisions to Treatment Plan:  Nephrology signed off  on care; new creatine norm = weekly creatine checks Neuro psychology referral MBS 03/05/24   Teaching Needs: Safety, medications, dietary modification, transfers, toileting, etc.   Current Barriers to Discharge: Decreased caregiver support and Home enviroment access/layout  Possible Resolutions to Barriers: Family education     Medical Summary Current Status: left hemi starting to improve, ARF on CKD stable     Possible Resolutions to [employer] Focus: nephrology has signed off will need OP f/u,  work on incontinence, need to solidify d/c plan   Continued Need for  Acute Rehabilitation Level of Care: The patient requires daily medical management by a physician with specialized training in physical medicine and rehabilitation for the following reasons: Direction of a multidisciplinary physical rehabilitation program to maximize functional independence : Yes Medical management of patient stability for increased activity during participation in an intensive rehabilitation regime.: Yes Analysis of laboratory values and/or radiology reports with any subsequent need for medication adjustment and/or medical intervention. : Yes   I attest that I was present, lead the team conference, and concur with the assessment and plan of the team.   Fredericka Sober B 03/05/2024, 8:38 AM

## 2024-03-05 NOTE — Procedures (Signed)
 Modified Barium Swallow Study  Patient Details  Name: Juan Townsend MRN: [id number] Date of Birth: [birth year]  Today's Date: 03/05/2024  Modified Barium Swallow completed.  Full report located under Chart Review in the Imaging Section.  History of Present Illness Joseguadalupe Stan is a 48 yo male presenting to ED 6/10 with L sided weakness, falls, and L eye vision changes. 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 Pt passed the [place] swallow screen 6/10 but RN noted increased coughing with liquids and SLP was consulted. FEES 6/12 inconclusive in regards to swallowing function and safety but showed bilateral arytenoid movement, but limited and incongruent in addition to minimal glottic space at rest, L aryepiglottic fold erythema, and epiglottic excresence on the R side. PMH includes CVA without residual deficits, HIV, hypercholesteremia, HTN   Clinical Impression Patient exhibits mild oropharyngeal dysphagia characterized primarily by lingering deficits to swallow timing resulting in movement of the head of the bolus into the lower pharynx at times prior to swallow initiation, which intermittently results in silent aspiration of thin liquids. Aspiration observed with first teaspoon sip of thin liquids, however no further aspiration observed with teaspoon or cup sips, nor consecutive cup sips, throughout the study. Patient did again exhibit aspiration of thin liquids via straw sips and when taking medications with thin liquid wash. Aspiration was always silent with poor cough strength upon cued cough when attempting to expectorate aspirated material. No significant airway invasion events observed with NTL nor HTL. Recommend regular/thin liquid diet with NO STRAWS and medications administered whole in puree. Will continue to target  improvments in swallow timing as patient progresses. Factors that may increase risk of adverse event in presence of aspiration Noe & Lianne 2021): Poor general health and/or compromised immunity;Reduced cognitive function;Inadequate oral hygiene;Weak cough  Swallow Evaluation Recommendations Recommendations: PO diet PO Diet Recommendation: Regular;Thin liquids (Level 0) Liquid Administration via: Spoon;Cup Medication Administration: Whole meds with puree Supervision: Full supervision/cueing for swallowing strategies;Staff to assist with self-feeding Swallowing strategies  : Minimize environmental distractions;Slow rate;Small bites/sips;Head turn left during swallowing Postural changes: Position pt fully upright for meals Oral care recommendations: Oral care QID (4x/day);Oral care before PO    Mariabelen Pressly, M.A., CCC-SLP   Rosina A Kishana Battey 03/05/2024,1:00 PM

## 2024-03-05 NOTE — Progress Notes (Signed)
 Physical Therapy Session Note  Patient Details  Name: Juan Townsend MRN: [id number] Date of Birth: [birth year]  Today's Date: 03/05/2024 PT Individual Time: 0800-0859 PT Individual Time Calculation (min): 59 min   Short Term Goals: Week 2:   Patient lying in bed on entrance to room. Patient alert and agreeable to PT session.   Patient reported that he felt good today and that he pleased with how is therapy is progressing and that he feels as if his L arm is becoming stronger.   Therapeutic Activity: Bed Mobility: Pt performed supine<>sit on EOB with supervision. Few VC for going from lying to sitting at EOB. Transfers: Pt performed sit<>stand and stand sit transfers throughout session with CGA . Provided VC for weight shifting forward and placing hand on bed to push up. Lowerbody dressing: minA with tactile trunk stabilization to lift leg to put R pant leg on. Pt does not have enough strength to weight bear through L UE to stabilize trunk while doing so.   Gait Training:  Pt ambulated ~120 using RW with CGA. Pt demonstrated the following gait deviations with therapist providing the described cuing and facilitation for improvement: Pt needs vc to look up while walking, and advancing R foot far enough to prevent posterior instability. If pt does not advance R foot far enough he leans backwards and needs minA to prevent falling.    Neuromuscular Re-ed: NMR facilitated during session with focus on dynamic standing balance, weight shifting, reaching out of BOS.SABRA - standing with mirror for posture to stay erect -standing with rotational reaches 2x10 L 2 x 10 R -Standing with weight shifting laterally 2x10   NMR performed for improvements in motor control and coordination, balance, sequencing, judgement, and self confidence/ efficacy in performing all aspects of mobility at highest level of independence.    Patient left in wc at end of session with brakes locked, with chair alarm set, and all  needs within reach.   Skilled Therapeutic Interventions/Progress Updates:      Therapy Documentation Precautions:  Precautions Precautions: Fall Recall of Precautions/Restrictions: Impaired Precaution/Restrictions Comments: L hemipareisis, permissive HTN, HIV (+) Restrictions Weight Bearing Restrictions Per Provider Order: No General:   Vital Signs: Therapy Vitals Temp: 99.1 F (37.3 C) Temp Source: Oral Pulse Rate: 72 Resp: 18 BP: (!) 132/94 Patient Position (if appropriate): Lying Oxygen Therapy SpO2: 98 % O2 Device: Room Air Pain:   Mobility:   Locomotion :    Trunk/Postural Assessment :    Balance:   Exercises:   Other Treatments:      Therapy/Group: Individual Therapy  [name] 03/05/2024, 9:07 AM

## 2024-03-06 LAB — CBC
HCT: 25.1 % — ABNORMAL LOW (ref 39.0–52.0)
Hemoglobin: 8.4 g/dL — ABNORMAL LOW (ref 13.0–17.0)
MCH: 31.3 pg (ref 26.0–34.0)
MCHC: 33.5 g/dL (ref 30.0–36.0)
MCV: 93.7 fL (ref 80.0–100.0)
Platelets: 421 10*3/uL — ABNORMAL HIGH (ref 150–400)
RBC: 2.68 MIL/uL — ABNORMAL LOW (ref 4.22–5.81)
RDW: 13.6 % (ref 11.5–15.5)
WBC: 5.6 10*3/uL (ref 4.0–10.5)
nRBC: 0 % (ref 0.0–0.2)

## 2024-03-06 NOTE — Progress Notes (Signed)
 PROGRESS NOTE   Subjective/Complaints:  No coughing this am , slept well Upgraded to thin liquids after MBS yesterday   ROS- neg CP, SOB, N/V?D      03/06/2024    5:30 AM 03/06/2024    5:00 AM 03/05/2024    8:14 PM  Vitals with BMI  Weight  138 lbs 11 oz   BMI  22.39   Systolic 127  130  Diastolic 93  84  Pulse 77  68      Objective:   DG Swallowing Func-Speech Pathology Result Date: 03/05/2024 Table formatting from the original result was not included. Modified Barium Swallow Study Patient Details Name: Juan Townsend MRN: [id number] Date of Birth: October 31, 1975 Today's Date: 03/05/2024 HPI/PMH: HPI: Juan Townsend is a 48 yo male presenting to ED 6/10 with L sided weakness, falls, and L eye vision changes. 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 Pt passed the [place] swallow screen 6/10 but RN noted increased coughing with liquids and SLP was consulted. FEES 6/12 inconclusive in regards to swallowing function and safety but showed bilateral arytenoid movement, but limited and incongruent in addition to minimal glottic space at rest, L aryepiglottic fold erythema, and epiglottic excresence on the R side. PMH includes CVA without residual deficits, HIV, hypercholesteremia, HTN Clinical Impression: Clinical Impression: Patient exhibits mild oropharyngeal dysphagia characterized primarily by lingering deficits to swallow timing resulting in movement of the head of the bolus into the lower pharynx at times prior to swallow initiation, which intermittently results in silent aspiration of thin liquids. Aspiration observed with first teaspoon sip of thin liquids, however no further aspiration observed with teaspoon or cup sips, nor consecutive cup sips, throughout the  study. Patient did again exhibit aspiration of thin liquids via straw sips and when taking medications with thin liquid wash. Aspiration was always silent with poor cough strength upon cued cough when attempting to expectorate aspirated material. No significant airway invasion events observed with NTL nor HTL. Recommend regular/thin liquid diet with NO STRAWS and medications administered whole in puree. Will continue to target improvments in swallow timing as patient progresses. Factors that may increase risk of adverse event in presence of aspiration Noe & Lianne 2021): Factors that may increase risk of adverse event in presence of aspiration Noe & Lianne 2021): Poor general health and/or compromised immunity; Reduced cognitive function; Inadequate oral hygiene; Weak cough Recommendations/Plan: Swallowing Evaluation Recommendations Swallowing Evaluation Recommendations Recommendations: PO diet PO Diet Recommendation: Regular; Thin liquids (Level 0) Liquid Administration via: Spoon; Cup Medication Administration: Whole meds with puree Supervision: Full supervision/cueing for swallowing strategies; Staff to assist with self-feeding Swallowing strategies  : Minimize environmental distractions; Slow rate; Small bites/sips; Head turn left during swallowing Postural changes: Position pt fully upright for meals Oral care recommendations: Oral care QID (4x/day); Oral care before PO Treatment Plan Treatment Plan Treatment recommendations: Therapy as outlined in treatment plan below Follow-up recommendations: Outpatient SLP Functional status assessment: Patient has had a recent decline in their functional status and demonstrates  the ability to make significant improvements in function in a reasonable and predictable amount of time. Treatment frequency: Min 2x/week Treatment duration: 2 weeks Interventions: Aspiration precaution training; Compensatory techniques; Patient/family education; Trials of upgraded  texture/liquids; Diet toleration management by SLP Recommendations Recommendations for follow up therapy are one component of a multi-disciplinary discharge planning process, led by the attending physician.  Recommendations may be updated based on patient status, additional functional criteria and insurance authorization. Assessment: Orofacial Exam: Orofacial Exam Oral Cavity: Oral Hygiene: WFL Oral Cavity - Dentition: Poor condition Orofacial Anatomy: WFL Oral Motor/Sensory Function: Suspected cranial nerve impairment CN V - Trigeminal: WFL CN VII - Facial: Left motor impairment CN IX - Glossopharyngeal, CN X - Vagus: Left motor impairment CN XII - Hypoglossal: Left motor impairment Anatomy: Anatomy: Suspected cervical osteophytes Boluses Administered: Boluses Administered Boluses Administered: Thin liquids (Level 0); Mildly thick liquids (Level 2, nectar thick); Moderately thick liquids (Level 3, honey thick); Puree  Oral Impairment Domain: Oral Impairment Domain Lip Closure: No labial escape Tongue control during bolus hold: Cohesive bolus between tongue to palatal seal Bolus transport/lingual motion: Brisk tongue motion Oral residue: Trace residue lining oral structures Location of oral residue : Tongue; Palate Initiation of pharyngeal swallow : Pyriform sinuses  Pharyngeal Impairment Domain: Pharyngeal Impairment Domain Soft palate elevation: No bolus between soft palate (SP)/pharyngeal wall (PW) Laryngeal elevation: Complete superior movement of thyroid cartilage with complete approximation of arytenoids to epiglottic petiole Anterior hyoid excursion: Complete anterior movement Epiglottic movement: Complete inversion Laryngeal vestibule closure: Complete, no air/contrast in laryngeal vestibule Pharyngeal stripping wave : Present - complete Pharyngeal contraction (A/P view only): N/A Pharyngoesophageal segment opening: Complete distension and complete duration, no obstruction of flow Tongue base retraction:  Trace column of contrast or air between tongue base and PPW Pharyngeal residue: Trace residue within or on pharyngeal structures Location of pharyngeal residue: Tongue base; Valleculae; Pyriform sinuses  Esophageal Impairment Domain: Esophageal Impairment Domain Esophageal clearance upright position: Complete clearance, esophageal coating Pill: Pill Consistency administered: Thin liquids (Level 0) Thin liquids (Level 0): Impaired (see clinical impressions) Penetration/Aspiration Scale Score: Penetration/Aspiration Scale Score 1.  Material does not enter airway: Moderately thick liquids (Level 3, honey thick); Puree 2.  Material enters airway, remains ABOVE vocal cords then ejected out: Mildly thick liquids (Level 2, nectar thick) 8.  Material enters airway, passes BELOW cords without attempt by patient to eject out (silent aspiration) : Thin liquids (Level 0) Compensatory Strategies: Compensatory Strategies Compensatory strategies: Yes Straw: Ineffective Ineffective Straw: Thin liquid (Level 0)   General Information: Caregiver present: No  Diet Prior to this Study: Regular; Mildly thick liquids (Level 2, nectar thick)   Temperature : Normal   Respiratory Status: WFL   Supplemental O2: None (Room air)   History of Recent Intubation: No  Behavior/Cognition: Alert; Cooperative; Requires cueing Self-Feeding Abilities: Able to self-feed Baseline vocal quality/speech: Normal Volitional Cough: Able to elicit Volitional Swallow: Able to elicit Exam Limitations: No limitations Goal Planning: Prognosis for improved oropharyngeal function: Good No data recorded No data recorded Patient/Family Stated Goal: none stated Consulted and agree with results and recommendations: Patient Pain: Pain Assessment Pain Assessment: No/denies pain End of Session: Start Time:No data recorded Stop Time: No data recorded Time Calculation:No data recorded Charges: No data recorded SLP visit diagnosis: SLP Visit Diagnosis: Dysphagia, oropharyngeal  phase (R13.12); Cognitive communication deficit (R41.841) Past Medical History: Past Medical History: Diagnosis Date  HIV (human immunodeficiency virus infection) (HCC)   Hypercholesteremia   Hypertension   Stroke (  HCC)  Past Surgical History: No past surgical history on file. Rosina LABOR Ellin 03/05/2024, 1:01 PM  Recent Labs    03/06/24 0610  WBC 5.6  HGB 8.4*  HCT 25.1*  PLT 421*    No results for input(s): NA, K, CL, CO2, GLUCOSE, BUN, CREATININE, CALCIUM  in the last 72 hours.   Intake/Output Summary (Last 24 hours) at 03/06/2024 0855 Last data filed at 03/06/2024 9178 Gross per 24 hour  Intake 836 ml  Output 900 ml  Net -64 ml        Physical Exam: Vital Signs Blood pressure (!) 127/93, pulse 77, temperature 98.7 F (37.1 C), temperature source Oral, resp. rate 18, height 5' 6 (1.676 m), weight 62.9 kg, SpO2 97%.   General: No acute distress.  Sitting up in bedside chair Mood and affect are appropriate Heart: Regular rate and rhythm no rubs murmurs or extra sounds Lungs: Clear to auscultation, breathing unlabored, no rales or wheezes Abdomen: Positive bowel sounds, soft nontender to palpation, nondistended Extremities: No clubbing, cyanosis, or edema Skin: No evidence of breakdown, no evidence of rash  Neurologic: Awake, alert, oriented x 4. Cranial nerves II through XII intact,  motor strength is 5/5 on the right 3- left delt, bi, tri grip, 3- knee ext 0/5 at foot and ankle Sensory exam normal sensation to light touch and proprioception in bilateral upper and reduced LT lower extremities TOne- sustained clonus at Left ankle No pain orwith knee ROM, no pain to palp over the hamstrings Musculoskeletal: Full range of motion in all 4 extremities. No joint swelling  Physical exam unchanged from the above on reexamination 03/06/24    Assessment/Plan: 1. Functional deficits which require 3+ hours per day of interdisciplinary therapy in a comprehensive  inpatient rehab setting. Physiatrist is providing close team supervision and 24 hour management of active medical problems listed below. Physiatrist and rehab team continue to assess barriers to discharge/monitor patient progress toward functional and medical goals  Care Tool:  Bathing    Body parts bathed by patient: Left arm, Chest, Abdomen, Front perineal area, Right upper leg, Left upper leg, Face, Left lower leg, Right lower leg   Body parts bathed by helper: Right arm, Buttocks     Bathing assist Assist Level: Minimal Assistance - Patient > 75%     Upper Body Dressing/Undressing Upper body dressing   What is the patient wearing?: Pull over shirt    Upper body assist Assist Level: Minimal Assistance - Patient > 75%    Lower Body Dressing/Undressing Lower body dressing      What is the patient wearing?: Incontinence brief, Pants     Lower body assist Assist for lower body dressing: Moderate Assistance - Patient 50 - 74%     Toileting Toileting    Toileting assist Assist for toileting: Maximal Assistance - Patient 25 - 49% (standing toilet use and clothing management)     Transfers Chair/bed transfer  Transfers assist  Chair/bed transfer activity did not occur: Safety/medical concerns  Chair/bed transfer assist level: Moderate Assistance - Patient 50 - 74%     Locomotion Ambulation   Ambulation assist      Assist level: Maximal Assistance - Patient 25 - 49% Assistive device: Hand held assist (+2) Max distance: 30 ft   Walk 10 feet activity   Assist     Assist level: Maximal Assistance - Patient 25 - 49% Assistive device: Hand held assist (+2)   Walk 50 feet activity   Assist Walk 50  feet with 2 turns activity did not occur: Safety/medical concerns         Walk 150 feet activity   Assist Walk 150 feet activity did not occur: Safety/medical concerns         Walk 10 feet on uneven surface  activity   Assist Walk 10 feet on  uneven surfaces activity did not occur: Safety/medical concerns         Wheelchair     Assist Is the patient using a wheelchair?: Yes Type of Wheelchair: Manual    Wheelchair assist level: Dependent - Patient 0% Max wheelchair distance: 250 ft    Wheelchair 50 feet with 2 turns activity    Assist        Assist Level: Dependent - Patient 0%   Wheelchair 150 feet activity     Assist      Assist Level: Dependent - Patient 0%   Blood pressure (!) 127/93, pulse 77, temperature 98.7 F (37.1 C), temperature source Oral, resp. rate 18, height 5' 6 (1.676 m), weight 62.9 kg, SpO2 97%.  Medical Problem List and Plan: 1. 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 Will order PRAFO to use at night  - Stable to continue inpatient rehab  2.  Antithrombotics: -DVT/anticoagulation:  Pharmaceutical: Lovenox  may d/c amb 100'             -antiplatelet therapy: DAPT X 3 weeks followed by ASA alone.  3. Pain Management: Tylenol  prn.  4. Mood/Behavior/Sleep: LCSW to follow for evaluation and support.              -antipsychotic agents: N/A--agitation has resolved.  5. Neuropsych/cognition: This patient is capable of making decisions on his own behalf. 6. Skin/Wound Care: Routine pressure relief measures.  7. Fluids/Electrolytes/Nutrition: Monitor I/O. Check CMET in am.  8.  HTN: Monitor BP TID and slowly  normalize over next few week per nephrology             --on Amlodipine  10, metoprolol  50 mg bid and hydralazine  100 mg every 8 hrs. Vitals:   03/05/24 2014 03/06/24 0530  BP: 130/84 (!) 127/93  Pulse: 68 77  Resp: 18 18  Temp: 98.7 F (37.1 C) 98.7 F  (37.1 C)  SpO2: 100% 97%   6-25: Blood pressure overall improved, continue amlodipine  10 mg, hydralazine  100 mg, metoprolol  50 mg per nephrology.  Appreciate their assistance   9. Dysphagia: D3, nectar (06/16) with head turn to left for all liquids. --Full supervision for safety and aspiration precautions.   10. 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  Sent studies for atypical HUS. BUN/Cr still elevated over baseline but no additional tx recommended other  than weekly check.  Will rec Nephro f/u as OP       Latest Ref Rng & Units 03/03/2024    4:59 AM 03/02/2024    7:58 PM 03/02/2024    5:17 AM  BMP  Glucose 70 - 99 mg/dL 95  891  98  BUN 6 - 20 mg/dL 32  34  30   Creatinine 0.61 - 1.24 mg/dL 7.24  7.27  7.21   Sodium 135 - 145 mmol/L 137  139  136   Potassium 3.5 - 5.1 mmol/L 3.9  4.1  3.7   Chloride 98 - 111 mmol/L 105  106  106   CO2 22 - 32 mmol/L 28  25  27    Calcium  8.9 - 10.3 mg/dL 8.7  9.0  8.6      11. HIV disease: Continue BIKTARVY  50-200-25. Now followed by Dr. CHRISTELLA. Singh.  12. Anemia of chronic disease: Likely due to HIV/CKD. Will recheck in am.  13. Thrombocytopenia: Platelets 51 at admission. Now up to 147.  --Monitor for recovery 14. Hypocalcemia: Ionized calcium  1.08. on supplement, corrected to 9.0 when factoring in low alb 15. Hyponatremia: mild/borderline, improved   16. Tobacco use: Nicotine  patch. Encourage nicotine  cessation.   LOS: 10 days A FACE TO FACE EVALUATION WAS PERFORMED  Prentice FORBES Compton 03/06/2024, 8:55 AM

## 2024-03-06 NOTE — Progress Notes (Signed)
 Physical Therapy Weekly Progress Note  Patient Details  Name: Juan Townsend MRN: [id number] Date of Birth: 11-09-1975  Beginning of progress report period: {Time; dates multiple:304500300} End of progress report period: {Time; dates multiple:304500300}  {CHL IP REHAB PT TIME CALCULATION:304800500}  Patient has met {number 1-5:22450} of {number 1-5:20334} short term goals.  ***  Patient continues to demonstrate the following deficits {impairments:3041632} and therefore will continue to benefit from skilled PT intervention to increase functional independence with mobility.  Patient {LTG progression:3041653}.  {plan of rjmz:6958345}  PT Short Term Goals {DUH:6958314}  Skilled Therapeutic Interventions/Progress Updates:      Therapy Documentation Precautions:  Precautions Precautions: Fall Recall of Precautions/Restrictions: Impaired Precaution/Restrictions Comments: L hemipareisis, permissive HTN, HIV (+) Restrictions Weight Bearing Restrictions Per Provider Order: No General:   Vital Signs: Therapy Vitals Temp: 97.8 F (36.6 C) Temp Source: Oral Pulse Rate: 68 Resp: 16 BP: (!) 133/90 Patient Position (if appropriate): Lying Oxygen Therapy SpO2: 100 % O2 Device: Room Air Pain:   Vision/Perception     Mobility:   Locomotion :    Trunk/Postural Assessment :    Balance:   Exercises:   Other Treatments:     Therapy/Group: {Therapy/Group:3049007}  Juan Townsend 03/06/2024, 6:15 PM

## 2024-03-06 NOTE — Progress Notes (Signed)
 Speech Language Pathology Daily Session Note  Patient Details  Name: Mynor Witkop MRN: [id number] Date of Birth: 01-Jun-1976  Today's Date: 03/06/2024 SLP Individual Time: 1101-1158 SLP Individual Time Calculation (min): 57 min  Short Term Goals: Week 2: SLP Short Term Goal 1 (Week 2): STGs=LTGs d/t ELOS  Skilled Therapeutic Interventions: Skilled therapy session focused on cognitive goals. SLP facilitated session by prompting patient to navigate to various locations around the hospital utilizing signs (main entrance, gift shop, cafeteria). Patient navigated to written locations given minA. At gift shop, patient identified various objects on predetermined list and bought three items within $50 budget. Patient completed simple addition to ensure adherence to budget with minA. Patient with increased spirits this date compared to prior with increased vocal intonation and positive facial expressions. Patient independently oriented x4. Patient left in chair with alarm set and call bell in reach. Continue POC.   Pain Denies  Therapy/Group: Individual Therapy  Saige Busby M.A., CCC-SLP 03/06/2024, 7:42 AM

## 2024-03-06 NOTE — Progress Notes (Signed)
 Speech Language Pathology Daily Session Note  Patient Details  Name: Juan Townsend MRN: [id number] Date of Birth: 1976/01/02  Today's Date: 03/06/2024 SLP Individual Time: 1213-1236 SLP Individual Time Calculation (min): 23 min  Short Term Goals: Week 2: SLP Short Term Goal 1 (Week 2): STGs=LTGs d/t ELOS  Skilled Therapeutic Interventions: SLP conducted skilled therapy session targeting dysphagia goals. SLP assessed patient's tolerance of recently upgraded regular/thin liquids. Provided setupA for items, then patient self fed. Across all items, patient exhibited no overt s/sx of penetration/aspiration and adhered to no straws strategy with supervision. Given overall improved tolerance of meals, SLP changed nursing recommendations from full supervision to intermittent supervision. Nursing and patient alerted to change. Patient was left in room with call bell in reach and alarm set. SLP will continue to target goals per plan of care.    Pain  None  Therapy/Group: Individual Therapy  Keidan Aumiller, M.A., CCC-SLP  Devian Bartolomei A Nazir Hacker 03/06/2024, 12:39 PM

## 2024-03-06 NOTE — Progress Notes (Signed)
 Physical Therapy Session Note  Patient Details  Name: Vardaan Depascale MRN: [id number] Date of Birth: September 17, 1975  Today's Date: 03/06/2024 PT Individual Time: 1305-1403 PT Individual Time Calculation (min): 58 min   Short Term Goals: Week 1:  PT Short Term Goal 1 (Week 1): Pt will perform bed mobility with overall consistent CGA. PT Short Term Goal 2 (Week 1): Pt will perform sit<>stand to no AD with MinA. PT Short Term Goal 3 (Week 1): Pt will perform stand pivot transfers with consistent ModA. PT Short Term Goal 4 (Week 1): pt will perform squat pivot transfers with consistent CGA. PT Short Term Goal 5 (Week 1): Pt will ambulate 50 ft with ModA from therapist and CGA from +2. Week 2:     Skilled Therapeutic Interventions/Progress Updates:      Therapy Documentation Precautions:  Precautions Precautions: Fall Recall of Precautions/Restrictions: Impaired Precaution/Restrictions Comments: L hemipareisis, permissive HTN, HIV (+) Restrictions Weight Bearing Restrictions Per Provider Order: No General:   Vital Signs: Therapy Vitals Temp: 97.8 F (36.6 C) Temp Source: Oral Pulse Rate: 68 Resp: 16 BP: (!) 133/90 Patient Position (if appropriate): Lying Oxygen Therapy SpO2: 100 % O2 Device: Room Air Pain:   Mobility:   Locomotion :    Trunk/Postural Assessment :    Balance:   Exercises:   Other Treatments:      Therapy/Group: Individual Therapy  [name] PT, DPT, CSRS 03/06/2024, 6:14 PM

## 2024-03-06 NOTE — Progress Notes (Signed)
 LBM 6/24. No nausea or abdominal discomfort reported. PRN colace and miralax  offered-patient refused. Education reinforced.

## 2024-03-06 NOTE — Progress Notes (Signed)
 Occupational Therapy Session Note  Patient Details  Name: Juan Townsend MRN: [id number] Date of Birth: 06-Aug-1976  Today's Date: 03/06/2024 OT Individual Time: 9083-8985 OT Individual Time Calculation (min): 58 min    Short Term Goals: Week 2:  OT Short Term Goal 1 (Week 2): patient will utilize LUE as stabilizer in daily activities with CGA OT Short Term Goal 2 (Week 2): patient will demonstrate ability to perform transfers to wc, toilet, shower with CGA OT Short Term Goal 3 (Week 2): patient will demonstrate increased standing balance evidenced by ability to don pants with min A in sititng/ standing  Skilled Therapeutic Interventions/Progress Updates:     Pt received reclined in bed presenting with flat affect, receptive to skilled OT session reporting 0/10 pain- OT offering intermittent rest breaks, repositioning, and therapeutic support to optimize participation in therapy session. Focused this session on ADL retraining, functional movement patterns, and L NMRE.   Pt noted to be soiled upon OT arrival- receptive to using restroom and getting cleaned up at beginning of session. Pt transitioned to EOB with HOB elevated using bed rail CGA. Partial stand pivot to R EOB > wc light min A +verbal cues for technique and pacing. Transport to bathroom via wc total A. Stand pivot to R wc > elevated toilet seat min  for balance and min verbal cues for L foot placement. Pt able to doff/donn pants in standing position with min A for balance and min verbal cues for hemi-technique. Peri-care completed in seated position with SUP. Stand pivot elevated toilet seat > wc to L min A +increased time and verbal cues for hand placement and body mechanics.   Positioned Pt at sink for grooming/hygiene tasks with education provided on hemi-techniques. Pt able to utilize L UE during task as a stabilizer with SUP to stabilize toothpaste and mouthwash containers. He then completed oral care with SUP +increased time.    Transported Pt total A to therapy gym in wc for time management.   Engaged Pt in completing table glides with L UE in standing position to facilitate WB'ing and functional movement patterns for NMRE to L UE. Pt able to maintain standing balance with CGA-min A during activity while completing 2x10 reps of pushing/pulling (scapular protraction/retraction) and 2x10 reps of scapular circumduction clockwise/counterclockwise. Verbal and tactile cues provided for muscle activation, technique, and to decrease compensatory trunk flexion and shoulder elevation. Seated rest break provided following.   Pt then completed dynamic standing balance activity at elevated table to work on L UE motor control. Pt tasked with using L UE to move rings across rainbow arch with emphasis on maintained pinch, decreasing compensatory movements, and improved controlled movements at shoulder. Pt able to complete 10 trials of activity with tactile cues and intermittent min A provided at elbow to improve functional movement pattern.   Transported Pt back to room total A in wc. Pt was left resting in wc with call bell in reach, seatbelt alarm on, and all needs met.    Therapy Documentation Precautions:  Precautions Precautions: Fall Recall of Precautions/Restrictions: Impaired Precaution/Restrictions Comments: L hemipareisis, permissive HTN, HIV (+) Restrictions Weight Bearing Restrictions Per Provider Order: No   Therapy/Group: Individual Therapy  Katheryn SHAUNNA [place] 03/06/2024, 7:53 AM

## 2024-03-07 DIAGNOSIS — N1831 Chronic kidney disease, stage 3a: Secondary | ICD-10-CM

## 2024-03-07 DIAGNOSIS — D631 Anemia in chronic kidney disease: Secondary | ICD-10-CM

## 2024-03-07 DIAGNOSIS — R1310 Dysphagia, unspecified: Secondary | ICD-10-CM

## 2024-03-07 NOTE — Progress Notes (Signed)
 PROGRESS NOTE   Subjective/Complaints: No complaints or concerns this AM. Reports BM today.   ROS- neg CP, SOB, N/V/D, abdominal pain      03/07/2024    5:00 AM 03/07/2024    4:48 AM 03/06/2024    7:56 PM  Vitals with BMI  Weight 137 lbs 13 oz    BMI 22.25    Systolic  120 142  Diastolic  85 84  Pulse  69 81      Objective:   No results found.  Recent Labs    03/06/24 0610  WBC 5.6  HGB 8.4*  HCT 25.1*  PLT 421*    No results for input(s): NA, K, CL, CO2, GLUCOSE, BUN, CREATININE, CALCIUM  in the last 72 hours.   Intake/Output Summary (Last 24 hours) at 03/07/2024 1305 Last data filed at 03/07/2024 0852 Gross per 24 hour  Intake 380 ml  Output 550 ml  Net -170 ml        Physical Exam: Vital Signs Blood pressure 120/85, pulse 69, temperature 98.7 F (37.1 C), temperature source Oral, resp. rate 17, height 5' 6 (1.676 m), weight 62.5 kg, SpO2 100%.   General: No acute distress.  Laying in bed appears comfortable Mood and affect are appropriate Heart: Regular rate and rhythm no rubs murmurs or extra sounds Lungs: Clear to auscultation, breathing unlabored, no rales or wheezes Abdomen: Positive bowel sounds, soft nontender to palpation, nondistended Extremities: No clubbing, cyanosis, or edema Skin: No evidence of breakdown, no evidence of rash  Neurologic: Awake, alert, oriented x 4. Cranial nerves II through XII intact,  motor strength is 5/5 on the right 3- left delt, bi, tri grip, 3- knee ext 0/5 at foot and ankle Sensory exam normal sensation to light touch and proprioception in bilateral upper and reduced LT lower extremities TOne- sustained clonus at Left ankle No pain orwith knee ROM, no pain to palp over the hamstrings Musculoskeletal: Full range of motion in all 4 extremities. No joint swelling  Physical exam unchanged from the above on reexamination 03/07/24     Assessment/Plan: 1. Functional deficits which require 3+ hours per day of interdisciplinary therapy in a comprehensive inpatient rehab setting. Physiatrist is providing close team supervision and 24 hour management of active medical problems listed below. Physiatrist and rehab team continue to assess barriers to discharge/monitor patient progress toward functional and medical goals  Care Tool:  Bathing    Body parts bathed by patient: Left arm, Chest, Abdomen, Front perineal area, Right upper leg, Left upper leg, Face, Left lower leg, Right lower leg   Body parts bathed by helper: Right arm, Buttocks     Bathing assist Assist Level: Minimal Assistance - Patient > 75%     Upper Body Dressing/Undressing Upper body dressing   What is the patient wearing?: Pull over shirt    Upper body assist Assist Level: Minimal Assistance - Patient > 75%    Lower Body Dressing/Undressing Lower body dressing      What is the patient wearing?: Incontinence brief, Pants     Lower body assist Assist for lower body dressing: Moderate Assistance - Patient 50 - 74%     Toileting  Toileting    Toileting assist Assist for toileting: Maximal Assistance - Patient 25 - 49% (standing toilet use and clothing management)     Transfers Chair/bed transfer  Transfers assist  Chair/bed transfer activity did not occur: Safety/medical concerns  Chair/bed transfer assist level: Moderate Assistance - Patient 50 - 74%     Locomotion Ambulation   Ambulation assist      Assist level: Maximal Assistance - Patient 25 - 49% Assistive device: Hand held assist (+2) Max distance: 30 ft   Walk 10 feet activity   Assist     Assist level: Maximal Assistance - Patient 25 - 49% Assistive device: Hand held assist (+2)   Walk 50 feet activity   Assist Walk 50 feet with 2 turns activity did not occur: Safety/medical concerns         Walk 150 feet activity   Assist Walk 150 feet activity did  not occur: Safety/medical concerns         Walk 10 feet on uneven surface  activity   Assist Walk 10 feet on uneven surfaces activity did not occur: Safety/medical concerns         Wheelchair     Assist Is the patient using a wheelchair?: Yes Type of Wheelchair: Manual    Wheelchair assist level: Dependent - Patient 0% Max wheelchair distance: 250 ft    Wheelchair 50 feet with 2 turns activity    Assist        Assist Level: Dependent - Patient 0%   Wheelchair 150 feet activity     Assist      Assist Level: Dependent - Patient 0%   Blood pressure 120/85, pulse 69, temperature 98.7 F (37.1 C), temperature source Oral, resp. rate 17, height 5' 6 (1.676 m), weight 62.5 kg, SpO2 100%.  Medical Problem List and Plan: 1. 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 Will order PRAFO to use at night  - Stable to continue inpatient rehab  2.  Antithrombotics: -DVT/anticoagulation:  Pharmaceutical: Lovenox  may d/c amb 100'             -antiplatelet therapy: DAPT X 3 weeks followed by ASA alone.  3. Pain Management: Tylenol  prn.  4. Mood/Behavior/Sleep: LCSW to follow for evaluation and support.              -antipsychotic agents: N/A--agitation has resolved.  5. Neuropsych/cognition: This patient is capable of making decisions on his own behalf. 6. Skin/Wound Care: Routine pressure relief measures.  7. Fluids/Electrolytes/Nutrition: Monitor I/O. Check CMET in am.  8.  HTN: Monitor BP TID and slowly  normalize over next few week per nephrology             --on Amlodipine  10, metoprolol  50 mg bid and hydralazine  100 mg every 8 hrs. Vitals:    03/06/24 1956 03/07/24 0448  BP: (!) 142/84 120/85  Pulse: 81 69  Resp: 18 17  Temp: 98.2 F (36.8 C) 98.7 F (37.1 C)  SpO2: 96% 100%   6-25: Blood pressure overall improved, continue amlodipine  10 mg,  hydralazine  100 mg, metoprolol  50 mg per nephrology.  Appreciate their assistance 6/28 BP controlled, continue current regimen and monitor  9. Dysphagia: D3, nectar (06/16) with head turn to left for all liquids. --Full supervision for safety and aspiration precautions. -6/28 was upgraded thin liquids on 6/26   10. 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  Sent studies for atypical HUS. BUN/Cr still elevated over baseline but no additional tx recommended other  than weekly check.  Will rec Nephro f/u as OP  BMP Monday  -6/28  Will discontinue bladder scans, appears to be stable      Latest Ref Rng & Units 03/03/2024    4:59 AM 03/02/2024    7:58 PM 03/02/2024    5:17 AM  BMP  Glucose 70 - 99 mg/dL 95  891  98   BUN 6 - 20 mg/dL 32  34  30   Creatinine 0.61 - 1.24 mg/dL 7.24  7.27  7.21   Sodium 135 - 145 mmol/L 137  139  136   Potassium 3.5 - 5.1 mmol/L 3.9  4.1  3.7   Chloride 98 - 111 mmol/L 105  106  106   CO2 22 - 32 mmol/L 28  25  27    Calcium  8.9 - 10.3 mg/dL 8.7  9.0  8.6      11. HIV disease: Continue BIKTARVY  50-200-25. Now followed by Dr. CHRISTELLA. Singh.  12. Anemia of chronic disease: Likely due to HIV/CKD. Will recheck in am.   -HGB 8.4 on 6/27, recheck monday 13. Thrombocytopenia: Platelets 51 at admission. Now up to 147.  --Monitor for recovery 14. Hypocalcemia: Ionized calcium  1.08. on supplement, corrected to 9.0 when factoring in low alb 15. Hyponatremia: mild/borderline, improved   16. Tobacco use: Nicotine  patch. Encourage nicotine  cessation.   LOS: 11 days A FACE TO FACE EVALUATION WAS PERFORMED  Murray Collier 03/07/2024, 1:05 PM

## 2024-03-07 NOTE — Progress Notes (Signed)
 Occupational Therapy Session Note  Patient Details  Name: Juan Townsend MRN: [id number] Date of Birth: 11-20-1975  Today's Date: 03/07/2024 OT Individual Time: 9051-8955 OT Individual Time Calculation (min): 56 min    Short Term Goals: Week 2:  OT Short Term Goal 1 (Week 2): patient will utilize LUE as stabilizer in daily activities with CGA OT Short Term Goal 2 (Week 2): patient will demonstrate ability to perform transfers to wc, toilet, shower with CGA OT Short Term Goal 3 (Week 2): patient will demonstrate increased standing balance evidenced by ability to don pants with min A in sititng/ standing  Skilled Therapeutic Interventions/Progress Updates:     Pt received sitting up in wc presenting to be in good spirits receptive to skilled OT session reporting 0/10 pain- OT offering intermittent rest breaks, repositioning, and therapeutic support to optimize participation in therapy session. Focused this session on ADL retraining and L NMRE to improved overall independence in ADLs.   Mobility:  -Bed Mobility: Pt transitioned to EOB with HOB elevated with CGA +increased time. Pt with increased challenges motor planning through technique and how to lift trunk without falling over to the L.  -Transfers: Pt completed squat pivot transfers x4 trials during session to transition from EOB > wc and wc <> EOM. CGA-min A required to fully lift hips and min-mod verbal cues for technique. Re-educated Pt on head/hip relationship.   ADLs:  -Dressing: U/LB dressing completed sitting EOB for increased dynamic balance challenge and to increase overall independence in ADLs. Min questioning cues required to recall hemi-technique. Pt was able to don shirt with min A this session with assistance required for orienting shirt and weaving L UE- Pt noted to have increased challenges with this when sitting EOB d/t dynamic sitting balance deficits. Pt donned pants with min A required for weaving L LE +min A for dynamic  sitting and standing balance +verbal cues for pacing.   NMRE:  -Sitting EOM engaged Pt in series of L UE NMRE activities to improve overall motor learning and control for improved functional use during ADLs.  -Facilitated WB'ing with L UE positioned on large therapy ball with Pt tasked with rolling ball forwarded and backward moving through scapular protraction/retraction and elbow flexion/extension with Pt able to complete 2x10 reps min A +multimodal cues for technique and muscle activation. Positioned L UE on medium sized ball for gravity decreased plane with Pt then able to complete shoulder abduction/adduction and scapular protraction/retraction with mod tactile/verbal cues provided for trunk alignment and positioning and to decrease compensatory shoulder elevation.  -With Pt sitting EOM, positioned L UE on bench to facilitate WB'ing with while using R UE to reach towards ground to retrieve cones and transport to opposite side of bench. OT provided min A for L UE stabilization and dynamic trunk balance while reaching towards ground to increase overall trunk control and L UE motor control.  -Pt completed functional reaching horse shoes activity to improve L UE functional use with emphasis on trunk positioning and decreasing compensatory body movements. Pt tasked with retrieving horse shoe from OT reaching towards the ground, maintaining grasp on horseshoe while bringing it to his chest, and then hooking it over back of chair. Pt completed total of 8 reps with min A provided at elbow and mod verbal/tactile cues to decrease compensatory shoulder elevation and lateral trunk flexion.   Pt was left resting in wc with call bell in reach, seatbelt alarm on, and all needs met.    Therapy Documentation Precautions:  Precautions Precautions: Fall Recall of Precautions/Restrictions: Impaired Precaution/Restrictions Comments: L hemipareisis, permissive HTN, HIV (+) Restrictions Weight Bearing Restrictions  Per Provider Order: No   Therapy/Group: Individual Therapy  Katheryn SHAUNNA Mines 03/07/2024, 10:11 AM

## 2024-03-08 NOTE — Progress Notes (Signed)
 Slept good.  Declined scheduled mouthwash. Using urinal. LBM 06/28. Left prafo boot and left resting hand splint applied at bedtime. Denies pain. Flat affect. Darrall Strey A

## 2024-03-08 NOTE — Progress Notes (Signed)
 Occupational Therapy Session Note  Patient Details  Name: Juan Townsend MRN: [id number] Date of Birth: 21-Mar-1976  Today's Date: 03/08/2024 OT Individual Time: 1012-1057 OT Individual Time Calculation (min): 45 min    Short Term Goals: Week 1:  OT Short Term Goal 1 (Week 1): patient will don UB clothing with min A OT Short Term Goal 1 - Progress (Week 1): Met OT Short Term Goal 2 (Week 1): patient will don LB clothing with max A OT Short Term Goal 2 - Progress (Week 1): Met OT Short Term Goal 3 (Week 1): patient will complete funcitonal toilet and shower transfer with min A OT Short Term Goal 3 - Progress (Week 1): Met OT Short Term Goal 4 (Week 1): patient will demonstrate increased LUE incorperation as a stabilizer with min A OT Short Term Goal 4 - Progress (Week 1): Met  Skilled Therapeutic Interventions/Progress Updates:     (1st) Occupational Therapy Session: Patient resting in bed at the time of arrival. Patient reporting resting during the night with no report of pain at the time of treatment. Patient in agreement with completing BADL related task in showering The pt was able to transfer from supine in bed to EOB with close S, the pt was able to transfer from EOB to the w/c with MinA using the RW for additional balance and the arm of the w/c.  The pt was transported to the restroom and was able to enter the shower stall coming from standing to sitting  using the grab bar in the shower and the arm of the w/c.  The pt was able to remove his over head shirt with MinA . The pt was able to come from sit to stand to doff his underwear and pants at [facility] using the grab bar.  The pt was MaxA for his socks. The pt was able to bathe his UB with MinA, he was ModA with his LB secondary to challenges with bilateral coordination due to L hemiparesis.  Following the shower exercise, the pt was able to exit the shower using the RW and grab bar with ModA for transferring to the w/c.  The pt was MaxA for  his socks, he was able to apply deo with MinA and he was s/uA for donning his over head hospital shirt.  The pt was ModA for donning his underwear and pants. At the end of the session, the pt remained at w/c LOF with the call light and bedside table within reach and all additional needs addressed.      (2nd) Occupational Therapy Session:  Patient resting in bed at the time of arrival. Patient was in agreement with completing NMR. Patient was able to come from supine in bed to EOB with close S. The pt was able to donn his shoes with ModA. The pt was able to come from EOB to w/c LOF 2x with CGA.The pt transferred back to EOB with CGA and vc's The pt went on to tolerate manual manipulation of the scapular and surrounding structures to improve communication with the L side the body for gains in functional performance. Patient performed an oppositional force exercise using the LUE against external force to improve strength in his L non dominate UE as an assist.  At the close of the session, the pt returned to w/c LOF with BLE supported and his chair alarm in place. The call light and bedside table were placed within reach with all additional needs addressed. .  Therapy Documentation Precautions:  Precautions Precautions: Fall Recall of Precautions/Restrictions: Impaired Precaution/Restrictions Comments: L hemipareisis, permissive HTN, HIV (+) Restrictions Weight Bearing Restrictions Per Provider Order: No  Therapy/Group: Individual Therapy  Elvera JONETTA Mace 03/08/2024, 4:21 PM

## 2024-03-08 NOTE — Plan of Care (Signed)
  Problem: RH BOWEL ELIMINATION Goal: RH STG MANAGE BOWEL WITH ASSISTANCE Description: STG Manage Bowel with toileting Assistance. Flowsheets (Taken 03/08/2024 0414) STG: Pt will manage bowels with assistance: 5-Supervision/curing Goal: RH STG MANAGE BOWEL W/MEDICATION W/ASSISTANCE Description: STG Manage Bowel with Medication with mod I Assistance. Flowsheets (Taken 03/08/2024 0414) STG: Pt will manage bowels with medication with assistance: 5-Supervision/cueing   Problem: RH SAFETY Goal: RH STG ADHERE TO SAFETY PRECAUTIONS W/ASSISTANCE/DEVICE Description: STG Adhere to Safety Precautions With cues Assistance/Device. Flowsheets (Taken 03/08/2024 0414) STG:Pt will adhere to safety precautions with assistance/device: 5-Supervision/cueing

## 2024-03-08 NOTE — Progress Notes (Signed)
 PROGRESS NOTE   Subjective/Complaints: Patient working with therapy, does have a shower.  No new complaints or concerns.  ROS- neg Chills, CP, SOB, N/V/D, abdominal pain      03/08/2024   12:00 PM 03/08/2024    8:53 AM 03/08/2024    5:02 AM  Vitals with BMI  Weight   136 lbs 7 oz  BMI   22.04  Systolic 124 118   Diastolic 88 79   Pulse 62 68       Objective:   No results found.  Recent Labs    03/06/24 0610  WBC 5.6  HGB 8.4*  HCT 25.1*  PLT 421*    No results for input(s): NA, K, CL, CO2, GLUCOSE, BUN, CREATININE, CALCIUM  in the last 72 hours.   Intake/Output Summary (Last 24 hours) at 03/08/2024 1302 Last data filed at 03/08/2024 0914 Gross per 24 hour  Intake 1429 ml  Output 1175 ml  Net 254 ml        Physical Exam: Vital Signs Blood pressure 124/88, pulse 62, temperature 97.7 F (36.5 C), temperature source Oral, resp. rate 14, height 5' 6 (1.676 m), weight 61.9 kg, SpO2 100%.   General: No acute distress.  Sitting in wheelchair working with therapy Mood and affect are appropriate Heart: Regular rate and rhythm no rubs murmurs or extra sounds Lungs: Clear to auscultation, breathing unlabored, no rales or wheezes Abdomen: Positive bowel sounds, soft nontender to palpation, nondistended Extremities: No clubbing, cyanosis, or edema Skin: No evidence of breakdown, no evidence of rash  Neurologic: Awake, alert, oriented x 4.  Usually answers in 1 or 2 word answers Cranial nerves II through XII intact,  motor strength is 5/5 on the right 3- left delt, bi, tri grip, 3- knee ext 0/5 at foot and ankle Sensory exam normal sensation to light touch and proprioception in bilateral upper and reduced LT lower extremities TOne- sustained clonus at Left ankle No pain orwith knee ROM, no pain to palp over the hamstrings Musculoskeletal: Full range of motion in all 4 extremities. No joint  swelling  Physical exam unchanged from the above on reexamination 03/08/24    Assessment/Plan: 1. Functional deficits which require 3+ hours per day of interdisciplinary therapy in a comprehensive inpatient rehab setting. Physiatrist is providing close team supervision and 24 hour management of active medical problems listed below. Physiatrist and rehab team continue to assess barriers to discharge/monitor patient progress toward functional and medical goals  Care Tool:  Bathing    Body parts bathed by patient: Left arm, Chest, Abdomen, Front perineal area, Right upper leg, Left upper leg, Face, Left lower leg, Right lower leg   Body parts bathed by helper: Right arm, Buttocks     Bathing assist Assist Level: Minimal Assistance - Patient > 75%     Upper Body Dressing/Undressing Upper body dressing   What is the patient wearing?: Pull over shirt    Upper body assist Assist Level: Minimal Assistance - Patient > 75%    Lower Body Dressing/Undressing Lower body dressing      What is the patient wearing?: Incontinence brief, Pants     Lower body assist Assist for lower  body dressing: Moderate Assistance - Patient 50 - 74%     Toileting Toileting    Toileting assist Assist for toileting: Maximal Assistance - Patient 25 - 49% (standing toilet use and clothing management)     Transfers Chair/bed transfer  Transfers assist  Chair/bed transfer activity did not occur: Safety/medical concerns  Chair/bed transfer assist level: Moderate Assistance - Patient 50 - 74%     Locomotion Ambulation   Ambulation assist      Assist level: Maximal Assistance - Patient 25 - 49% Assistive device: Hand held assist (+2) Max distance: 30 ft   Walk 10 feet activity   Assist     Assist level: Maximal Assistance - Patient 25 - 49% Assistive device: Hand held assist (+2)   Walk 50 feet activity   Assist Walk 50 feet with 2 turns activity did not occur: Safety/medical  concerns         Walk 150 feet activity   Assist Walk 150 feet activity did not occur: Safety/medical concerns         Walk 10 feet on uneven surface  activity   Assist Walk 10 feet on uneven surfaces activity did not occur: Safety/medical concerns         Wheelchair     Assist Is the patient using a wheelchair?: Yes Type of Wheelchair: Manual    Wheelchair assist level: Dependent - Patient 0% Max wheelchair distance: 250 ft    Wheelchair 50 feet with 2 turns activity    Assist        Assist Level: Dependent - Patient 0%   Wheelchair 150 feet activity     Assist      Assist Level: Dependent - Patient 0%   Blood pressure 124/88, pulse 62, temperature 97.7 F (36.5 C), temperature source Oral, resp. rate 14, height 5' 6 (1.676 m), weight 61.9 kg, SpO2 100%.  Medical Problem List and Plan: 1. 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 Will order PRAFO to use at night  - Stable to continue inpatient rehab  2.  Antithrombotics: -DVT/anticoagulation:  Pharmaceutical: Lovenox  may d/c amb 100'             -antiplatelet therapy: DAPT X 3 weeks followed by ASA alone.  3. Pain Management: Tylenol  prn.  4. Mood/Behavior/Sleep: LCSW to follow for evaluation and support.              -antipsychotic agents: N/A--agitation has resolved.  5. Neuropsych/cognition: This patient is capable of making decisions on his own behalf. 6. Skin/Wound Care: Routine pressure relief measures.  7. Fluids/Electrolytes/Nutrition: Monitor I/O. Check CMET in am.  8.  HTN: Monitor BP TID and slowly  normalize over next few week per nephrology             --on  Amlodipine  10, metoprolol  50 mg bid and hydralazine  100 mg every 8 hrs. Vitals:   03/08/24 0853 03/08/24 1200  BP: 118/79 124/88  Pulse: 68 62  Resp:  14  Temp:  97.7 F (36.5 C)  SpO2:  100%  6-25: Blood pressure overall improved, continue amlodipine  10 mg, hydralazine  100 mg, metoprolol  50 mg per nephrology.  Appreciate their assistance 6/28-29 BP controlled, continue current regimen and monitor  9. Dysphagia: D3, nectar (06/16) with head turn to left for all liquids. --Full supervision for safety and aspiration precautions. -6/28 was upgraded thin liquids on 6/26  -6/29 appears to be eating most of his meals  10. 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  Sent studies for atypical HUS. BUN/Cr still elevated over baseline but no additional tx recommended other  than weekly check.  Will rec Nephro f/u as OP  BMP tomorrow  -6/28  Will discontinue bladder scans, appears to be stable      Latest Ref Rng & Units 03/03/2024    4:59 AM 03/02/2024    7:58 PM 03/02/2024    5:17 AM  BMP  Glucose 70 - 99 mg/dL 95  891  98   BUN 6 - 20 mg/dL 32  34  30   Creatinine 0.61 - 1.24 mg/dL 7.24  7.27  7.21   Sodium 135 - 145 mmol/L 137  139  136   Potassium 3.5 - 5.1 mmol/L 3.9  4.1  3.7   Chloride 98 - 111 mmol/L 105  106  106   CO2 22 - 32 mmol/L 28  25  27    Calcium  8.9 - 10.3 mg/dL 8.7  9.0  8.6      11. HIV disease: Continue BIKTARVY  50-200-25. Now followed by Dr. CHRISTELLA. Singh.  12. Anemia of chronic disease: Likely due to HIV/CKD. Will recheck in am.   -HGB 8.4 on 6/27, recheck tomorrow 13. Thrombocytopenia: Platelets 51 at admission. Now up to 147.  --Monitor for recovery 14. Hypocalcemia: Ionized calcium  1.08. on supplement, corrected to 9.0 when factoring in low alb 15. Hyponatremia: mild/borderline, improved   16. Tobacco use: Nicotine  patch. Encourage nicotine  cessation.   LOS: 12 days A FACE TO FACE EVALUATION WAS  PERFORMED  Murray Collier 03/08/2024, 1:02 PM

## 2024-03-09 LAB — CBC
HCT: 26 % — ABNORMAL LOW (ref 39.0–52.0)
Hemoglobin: 8.6 g/dL — ABNORMAL LOW (ref 13.0–17.0)
MCH: 30.9 pg (ref 26.0–34.0)
MCHC: 33.1 g/dL (ref 30.0–36.0)
MCV: 93.5 fL (ref 80.0–100.0)
Platelets: 384 10*3/uL (ref 150–400)
RBC: 2.78 MIL/uL — ABNORMAL LOW (ref 4.22–5.81)
RDW: 13.5 % (ref 11.5–15.5)
WBC: 5.5 10*3/uL (ref 4.0–10.5)
nRBC: 0 % (ref 0.0–0.2)

## 2024-03-09 LAB — BASIC METABOLIC PANEL WITH GFR
Anion gap: 7 (ref 5–15)
BUN: 29 mg/dL — ABNORMAL HIGH (ref 6–20)
CO2: 27 mmol/L (ref 22–32)
Calcium: 9 mg/dL (ref 8.9–10.3)
Chloride: 103 mmol/L (ref 98–111)
Creatinine, Ser: 2.93 mg/dL — ABNORMAL HIGH (ref 0.61–1.24)
GFR, Estimated: 26 mL/min — ABNORMAL LOW (ref >60)
Glucose, Bld: 93 mg/dL (ref 70–99)
Potassium: 4.1 mmol/L (ref 3.5–5.1)
Sodium: 137 mmol/L (ref 135–145)

## 2024-03-09 NOTE — Progress Notes (Signed)
 PROGRESS NOTE   Subjective/Complaints:  Walked from gym with PT, doing puzzles with SLP using LUE for part of task   ROS- neg Chills, CP, SOB, N/V/D, abdominal pain      03/09/2024    5:12 AM 03/09/2024    5:00 AM 03/08/2024    7:48 PM  Vitals with BMI  Weight  134 lbs 4 oz   BMI  21.68   Systolic 138  137  Diastolic 82  87  Pulse 63  62      Objective:   No results found.  Recent Labs    03/09/24 0523  WBC 5.5  HGB 8.6*  HCT 26.0*  PLT 384    Recent Labs    03/09/24 0523  NA 137  K 4.1  CL 103  CO2 27  GLUCOSE 93  BUN 29*  CREATININE 2.93*  CALCIUM  9.0     Intake/Output Summary (Last 24 hours) at 03/09/2024 1053 Last data filed at 03/09/2024 0512 Gross per 24 hour  Intake 593 ml  Output 1150 ml  Net -557 ml        Physical Exam: Vital Signs Blood pressure 138/82, pulse 63, temperature 98.7 F (37.1 C), temperature source Oral, resp. rate 16, height 5' 6 (1.676 m), weight 60.9 kg, SpO2 100%.   General: No acute distress.  Sitting in wheelchair working with therapy Mood and affect are appropriate Heart: Regular rate and rhythm no rubs murmurs or extra sounds Lungs: Clear to auscultation, breathing unlabored, no rales or wheezes Abdomen: Positive bowel sounds, soft nontender to palpation, nondistended Extremities: No clubbing, cyanosis, or edema Skin: No evidence of breakdown, no evidence of rash  Neurologic: Awake, alert, oriented x 4.  Usually answers in 1 or 2 word answers Cranial nerves II through XII intact,  motor strength is 5/5 on the right 3- left delt, bi, tri grip, 3- knee ext 0/5 at foot and ankle Sensory exam normal sensation to light touch and proprioception in bilateral upper and reduced LT lower extremities TOne- sustained clonus at Left ankle No pain orwith knee ROM, no pain to palp over the hamstrings Musculoskeletal: Full range of motion in all 4 extremities. No  joint swelling  Physical exam unchanged from the above on reexamination 03/09/24    Assessment/Plan: 1. Functional deficits which require 3+ hours per day of interdisciplinary therapy in a comprehensive inpatient rehab setting. Physiatrist is providing close team supervision and 24 hour management of active medical problems listed below. Physiatrist and rehab team continue to assess barriers to discharge/monitor patient progress toward functional and medical goals  Care Tool:  Bathing    Body parts bathed by patient: Left arm, Chest, Abdomen, Front perineal area, Right upper leg, Left upper leg, Face, Left lower leg, Right lower leg   Body parts bathed by helper: Right arm, Buttocks     Bathing assist Assist Level: Minimal Assistance - Patient > 75%     Upper Body Dressing/Undressing Upper body dressing   What is the patient wearing?: Pull over shirt    Upper body assist Assist Level: Minimal Assistance - Patient > 75%    Lower Body Dressing/Undressing Lower body dressing  What is the patient wearing?: Incontinence brief, Pants     Lower body assist Assist for lower body dressing: Moderate Assistance - Patient 50 - 74%     Toileting Toileting    Toileting assist Assist for toileting: Maximal Assistance - Patient 25 - 49% (standing toilet use and clothing management)     Transfers Chair/bed transfer  Transfers assist  Chair/bed transfer activity did not occur: Safety/medical concerns  Chair/bed transfer assist level: Moderate Assistance - Patient 50 - 74%     Locomotion Ambulation   Ambulation assist      Assist level: Maximal Assistance - Patient 25 - 49% Assistive device: Hand held assist (+2) Max distance: 30 ft   Walk 10 feet activity   Assist     Assist level: Maximal Assistance - Patient 25 - 49% Assistive device: Hand held assist (+2)   Walk 50 feet activity   Assist Walk 50 feet with 2 turns activity did not occur: Safety/medical  concerns         Walk 150 feet activity   Assist Walk 150 feet activity did not occur: Safety/medical concerns         Walk 10 feet on uneven surface  activity   Assist Walk 10 feet on uneven surfaces activity did not occur: Safety/medical concerns         Wheelchair     Assist Is the patient using a wheelchair?: Yes Type of Wheelchair: Manual    Wheelchair assist level: Dependent - Patient 0% Max wheelchair distance: 250 ft    Wheelchair 50 feet with 2 turns activity    Assist        Assist Level: Dependent - Patient 0%   Wheelchair 150 feet activity     Assist      Assist Level: Dependent - Patient 0%   Blood pressure 138/82, pulse 63, temperature 98.7 F (37.1 C), temperature source Oral, resp. rate 16, height 5' 6 (1.676 m), weight 60.9 kg, SpO2 100%.  Medical Problem List and Plan: 1. 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 Will order PRAFO to use at night  - Stable to continue inpatient rehab  2.  Antithrombotics: -DVT/anticoagulation:  Pharmaceutical: Lovenox  may d/c amb 100'             -antiplatelet therapy: DAPT X 3 weeks followed by ASA alone.  3. Pain Management: Tylenol  prn.  4. Mood/Behavior/Sleep: LCSW to follow for evaluation and support.              -antipsychotic agents: N/A--agitation has resolved.  5. Neuropsych/cognition: This patient is capable of making decisions on his own behalf. 6. Skin/Wound Care: Routine pressure relief measures.  7. Fluids/Electrolytes/Nutrition: Monitor I/O. Check CMET in am.  8.  HTN: Monitor BP TID and slowly  normalize over next few week per nephrology             --on  Amlodipine  10, metoprolol  50 mg bid and hydralazine  100 mg every 8 hrs. Vitals:   03/08/24 1948 03/09/24 0512  BP: 137/87 138/82  Pulse:  62 63  Resp: 16 16  Temp: 98.3 F (36.8 C) 98.7 F (37.1 C)  SpO2: 100% 100%   6-25: Blood pressure overall improved, continue amlodipine  10 mg, hydralazine  100 mg, metoprolol  50 mg per nephrology.  Appreciate their assistance 6/28-29 BP controlled, continue current regimen and monitor  9. Dysphagia: D3, nectar (06/16) with head turn to left for all liquids. --Full supervision for safety and aspiration precautions. -6/28 was upgraded thin liquids on 6/26  -6/29 appears to be eating most of his meals  10. 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  Sent studies for atypical HUS. BUN/Cr still elevated over baseline but no additional tx recommended other  than weekly check.  Will rec Nephro f/u as OP  BMP tomorrow  -6/28  Will discontinue bladder scans, appears to be stable      Latest Ref Rng & Units 03/09/2024    5:23 AM 03/03/2024    4:59 AM 03/02/2024    7:58 PM  BMP  Glucose 70 - 99 mg/dL 93  95  891   BUN 6 - 20 mg/dL 29  32  34   Creatinine 0.61 - 1.24 mg/dL 7.06  7.24  7.27   Sodium 135 - 145 mmol/L 137  137  139   Potassium 3.5 - 5.1 mmol/L 4.1  3.9  4.1   Chloride 98 - 111 mmol/L 103  105  106   CO2 22 - 32 mmol/L 27  28  25    Calcium  8.9 - 10.3 mg/dL 9.0  8.7  9.0      11. HIV disease: Continue BIKTARVY  50-200-25. Now followed by Dr. CHRISTELLA. Singh.  12. Anemia of chronic disease: Likely due to HIV/CKD. Will recheck in am.  Hgb stable  13. Thrombocytopenia: Platelets 51 at admission. Now up to 147.  --Monitor for recovery 14. Hypocalcemia: Ionized calcium  1.08. on supplement, corrected to 9.0 when factoring in low alb 15. Hyponatremia: mild/borderline, improved   16. Tobacco use: Nicotine  patch. Encourage nicotine  cessation.   LOS: 13 days A FACE TO FACE EVALUATION WAS  PERFORMED  Prentice FORBES Compton 03/09/2024, 10:53 AM

## 2024-03-09 NOTE — Progress Notes (Signed)
 Slept good. Patient declined scheduled trazodone . Wore PRAFO boot and left resting hand splint most of night. Continent using urinal. PVR's low. ? Discontinue PVR checks.? Flat affect. Poor initiation with conversation. Juan Townsend A

## 2024-03-09 NOTE — Plan of Care (Signed)
  Problem: Consults Goal: RH STROKE PATIENT EDUCATION Description: See Patient Education module for education specifics  Outcome: Progressing   Problem: RH BOWEL ELIMINATION Goal: RH STG MANAGE BOWEL WITH ASSISTANCE Description: STG Manage Bowel with toileting Assistance. Outcome: Progressing   Problem: RH SAFETY Goal: RH STG ADHERE TO SAFETY PRECAUTIONS W/ASSISTANCE/DEVICE Description: STG Adhere to Safety Precautions With  cues Assistance/Device. Outcome: Progressing

## 2024-03-09 NOTE — Progress Notes (Signed)
 Speech Language Pathology Daily Session Note  Patient Details  Name: Juan Townsend MRN: [id number] Date of Birth: 1975/10/21  Today's Date: 03/09/2024 SLP Individual Time: 8884-8846 SLP Individual Time Calculation (min): 38 min  Short Term Goals: Week 2: SLP Short Term Goal 1 (Week 2): STGs=LTGs d/t ELOS  Skilled Therapeutic Interventions: SLP conducted skilled therapy session targeting cognitive goals. Engaged patient in discussion re: current cognitive status compared to PTA. Patient endorses that while he has noticed a positive improvement since rehab admission, he is still not back to cognitive baseline. Patient endorses ongoing challenges include processing speed and thought organization. SLP conducted skilled basic to mildly complex visual sequencing task with patient benefiting from supervision cues for accurate completion. Patient then completed task of increased difficulty with supervision cues. In final minutes of session, conduced mildly complex word problem task where patient benefited from supervision and mildly increased processing time to accurately solve problems. Patient was left in room with call bell in reach and alarm set. SLP will continue to target goals per plan of care.    Pain  None endorsed  Therapy/Group: Individual Therapy  Deone Omahoney, M.A., CCC-SLP  Paxton Binns A Lowery Paullin 03/09/2024, 12:05 PM

## 2024-03-09 NOTE — Progress Notes (Signed)
 Occupational Therapy Session Note  Patient Details  Name: Juan Townsend MRN: [id number] Date of Birth: [birth year]  Today's Date: 03/09/2024 OT Individual Time: 9195-9140 OT Individual Time Calculation (min): 55 min    Short Term Goals: Week 2:  OT Short Term Goal 1 (Week 2): patient will utilize LUE as stabilizer in daily activities with CGA OT Short Term Goal 2 (Week 2): patient will demonstrate ability to perform transfers to wc, toilet, shower with CGA OT Short Term Goal 3 (Week 2): patient will demonstrate increased standing balance evidenced by ability to don pants with min A in sititng/ standing  Skilled Therapeutic Interventions/Progress Updates:     Pt received semi-reclined in bed, dressed for the day politely declining need for shower this AM. Pt presenting with flat affect, however to be more lively compared to previous sessions receptive to skilled OT session reporting 0/10 pain- OT offering intermittent rest breaks, repositioning, and therapeutic support to optimize participation in therapy session. Pt reported he slept better last night and that he tolerated sleeping in [facility] boot and L wresting hand splint. Focus this session on ADL retraining, functional transfer training, and L UE NRME to improve functional use for ADLs.   Mobility:  -Bed Mobility: Pt transitioned to EOB using bed rail with HOB elevated with light min A to lift trunk +mod verbal cues for technique. Assistance required to prevent Pt from falling backwards or to the L when coming to EOB. Later in session, Pt transitioned from supine on mat table to EOM using log rolling technique with CGA +increased time, however max step by step verbal cues required for technique and body mechanics.  -Transfers: Pt completed squat pivot EOB > wc to L with light min A provided to fully guide hips to wc and min verbal cues for technique. Pt able to maintain L grasp on wc arm rest this session. Later in session Pt complete squat  pivot EOM <> wc with CGA +mod verbal cues for set-up and technique.   ADL:  -Positioned Pt at sink for grooming/hygiene tasks while seated in wc. Focused activities on utilizing hemi-techniques to increase overall independence and incorporating L UE into functional activities for improved motor learning. Pt able to brush teeth, wash face, and apply lotion with SUP +increased time utilizing L UE as a stabilizer progressing to gross assist this session with min verbal cues.   NMRE:  -In supine position on mat table, engaged Pt in completing series of B UE exercises in gravity eliminated position to increase overall motor control of L UE, improve reciprocal arm movements, and improve overall functional use of L UE for ADLs. Pt completed 2x10 reps of the following exercises with rest breaks provided between each exercises, consistent verbal cues provided for technique and mod tactile cues provided for muscle activation:   -Chest press, 2# weighted dowel  -Bicep curls, 2# weighted dowel  -Anterior shoulder flexion, 2# weighted dowel  -Internal/external shoulder rotation, 2# weighted dowel Following each exercise, Pt completed 3-5 reps of each movement without weighted dowel moving through full ROM in gravity eliminated plane. Improved L UE activation noted proximally and distally this session.  -Pt completed seated table top FM activity with functional reaching incorporated int task to improved overall L UE function. Pt task with retrieving medium sized pegs from OT at shoulder height using L UE and placing them into peg board. Focused activity on decreasing compensatory body movements, facilitating pincer grasp, and NMRE of functional movement patterns. Pt able to place  and remove 5 pegs with min A overall required for L UE control and to decrease compensatory shoulder elevation.   Pt transported back to room total A in wc for energy conservation. Pt was left resting in wc with call bell in reach, seatbelt  alarm on, and all needs met.    Therapy Documentation Precautions:  Precautions Precautions: Fall Recall of Precautions/Restrictions: Impaired Precaution/Restrictions Comments: L hemipareisis, permissive HTN, HIV (+) Restrictions Weight Bearing Restrictions Per Provider Order: No   Therapy/Group: Individual Therapy  Katheryn SHAUNNA [place] 03/09/2024, 8:00 AM

## 2024-03-09 NOTE — Progress Notes (Signed)
 Speech Language Pathology Daily Session Note  Patient Details  Name: Orrin Yurkovich MRN: [id number] Date of Birth: Mar 10, 1976  Today's Date: 03/09/2024 SLP Individual Time: 1430-1530 SLP Individual Time Calculation (min): 60 min  Short Term Goals: Week 2: SLP Short Term Goal 1 (Week 2): STGs=LTGs d/t ELOS  Skilled Therapeutic Interventions:   Pt greeted at bedside. He was pleasant and cooperative throughout tx tasks targeting cognition and dysphagia. He was able to recall recent events w/ 90% accuracy given modA cues. He was able to recall updated safe swallow strategies (after MBSS 6/26) w/ minA cues. SLP then facilitated CTAR. With s cues to ensure adequate technique, he completed 10 second isometric hold x3 and 10 isokinetic reps x3. During cognitive tasks, he completed a written organization task w/ s cues for attention to details. During verbal processing task (adding to category), he benefited from [facility] for working memory and reasoning. Additional processing time required as well, though minimal overall. At the end of tx tasks, SLP assisted pt back to bed. He required only minA physical assistance during squat pivot transfer to bed and demonstrated adequate safety awareness/sequencing throughout. He was then left with his bed alarm set and call light within reach. Recommend cont ST per POC.   Pain  None endorsed  Therapy/Group: Individual Therapy  [name] 03/09/2024, 2:47 PM

## 2024-03-09 NOTE — Progress Notes (Signed)
 Patient ID: Juan Townsend, male   DOB: [birth year], 48 y.o.   MRN: [id number] Met with pt and sister in-law and brother were here visiting. Discussed discharge date 7/7 with goals and coming in for education prior to him going home. Have set up education for next Monday 7/7 at 10;00-12:00. Pt is doing well and making progress.

## 2024-03-10 NOTE — Progress Notes (Signed)
 Speech Language Pathology Weekly Progress and Session Note  Patient Details  Name: Juan Townsend MRN: [id number] Date of Birth: [birth year]  Beginning of progress report period: March 03, 2024 End of progress report period: March 10, 2024  Today's Date: 03/10/2024 SLP Individual Time: 9199-9169 8897-8842 SLP Individual Time Calculation (min): 30 min 55 minutes  Short Term Goals: Week 2: SLP Short Term Goal 1 (Week 2): STGs=LTGs d/t ELOS    New Short Term Goals: Week 3: SLP Short Term Goal 1 (Week 3): STG = LTG due to ELOS  Weekly Progress Updates: Pt has made excellent goals towards LTGs this reporting period due to improved cognition and use of safe swallowing strategies. Currently, patient continues to require min-mod A for cognition and supervisionA for use of safe swallowing strategies during regular/thin diet.  Pt/family eduction ongoing. Pt would benefit from continued ST intervention to maximize cognition and dysphagia in order to maximize functional independence at d/c.    Intensity: Minumum of 1-2 x/day, 30 to 90 minutes Frequency: 3 to 5 out of 7 days Duration/Length of Stay: 4/9 Treatment/Interventions: Cognitive remediation/compensation;Cueing hierarchy;Dysphagia/aspiration precaution training;Functional tasks;Internal/external aids;Oral motor exercises;Patient/family education;Therapeutic Activities   Daily Session  Skilled Therapeutic Interventions:  Session 1: Skilled therapy session focused on cognitive goals. SLP facilitated session by targeting orientation. Patient oriented to self, situation and location independently and utilized external aid (board) to orient to time. SLP targeted problem solving goals through grocery planning/budgeting task. Patient required minA to name two meals and their ingredients in which would cost under $50. SLP and patient to continue activity next session. Patient left in bed with alarm set and call bell in reach. Continue POC.  Session 2:  Skilled therapy session focused on cognitive goals. SLP facilitated session by prompting continuation of activity initiated last session. Utilizing [employer], patient added items to cart and chose appropriate items according to amount of people to feed and budget. Patient then completed simple addition to ensure cost was in budget of $50. Patient required minA to complete simple mathematics. SLP targeted intellectual awareness by prompting patient to recall changes since admission. Patient recalled cognitive and physical changes given minA and verbalized feeling 80% back to normal with his thinking skills. Patient left in [facility] with alarm set and call bell in reach. Continue POC      Pain Session 1: Denies Session 2: Denies   Therapy/Group: Individual Therapy  Anasofia Micallef M.A., CCC-SLP 03/10/2024, 9:13 AM

## 2024-03-10 NOTE — Progress Notes (Signed)
 PROGRESS NOTE   Subjective/Complaints:    ROS- neg Chills, CP, SOB, N/V/D, abdominal pain      03/10/2024    5:27 AM 03/10/2024    5:26 AM 03/09/2024    8:16 PM  Vitals with BMI  Weight 134 lbs 1 oz    BMI 21.64    Systolic  121 129  Diastolic  81 73  Pulse   72      Objective:   No results found.  Recent Labs    03/09/24 0523  WBC 5.5  HGB 8.6*  HCT 26.0*  PLT 384    Recent Labs    03/09/24 0523  NA 137  K 4.1  CL 103  CO2 27  GLUCOSE 93  BUN 29*  CREATININE 2.93*  CALCIUM  9.0     Intake/Output Summary (Last 24 hours) at 03/10/2024 0739 Last data filed at 03/10/2024 0731 Gross per 24 hour  Intake 1074 ml  Output 475 ml  Net 599 ml        Physical Exam: Vital Signs Blood pressure 121/81, pulse 72, temperature 98.9 F (37.2 C), temperature source Oral, resp. rate 18, height 5' 6 (1.676 m), weight 60.8 kg, SpO2 100%.   General: No acute distress.  Sitting in wheelchair working with therapy Mood and affect are appropriate Heart: Regular rate and rhythm no rubs murmurs or extra sounds Lungs: Clear to auscultation, breathing unlabored, no rales or wheezes Abdomen: Positive bowel sounds, soft nontender to palpation, nondistended Extremities: No clubbing, cyanosis, or edema Skin: No evidence of breakdown, no evidence of rash  Neurologic: Awake, alert, oriented x 4.  Usually answers in 1 or 2 word answers Cranial nerves II through XII intact,  motor strength is 5/5 on the right 3- left delt, bi, tri grip, 3- knee ext 0/5 at foot and ankle- improving motor control Sensory exam normal sensation to light touch and proprioception in bilateral upper and reduced LT lower extremities  No pain or with knee ROM, no pain to palp over the hamstrings Musculoskeletal: Full range of motion in all 4 extremities. No joint swelling  Physical exam unchanged from the above on reexamination 03/10/24     Assessment/Plan: 1. Functional deficits which require 3+ hours per day of interdisciplinary therapy in a comprehensive inpatient rehab setting. Physiatrist is providing close team supervision and 24 hour management of active medical problems listed below. Physiatrist and rehab team continue to assess barriers to discharge/monitor patient progress toward functional and medical goals  Care Tool:  Bathing    Body parts bathed by patient: Left arm, Chest, Abdomen, Front perineal area, Right upper leg, Left upper leg, Face, Left lower leg, Right lower leg   Body parts bathed by helper: Right arm, Buttocks     Bathing assist Assist Level: Minimal Assistance - Patient > 75%     Upper Body Dressing/Undressing Upper body dressing   What is the patient wearing?: Pull over shirt    Upper body assist Assist Level: Minimal Assistance - Patient > 75%    Lower Body Dressing/Undressing Lower body dressing      What is the patient wearing?: Incontinence brief, Pants     Lower body  assist Assist for lower body dressing: Moderate Assistance - Patient 50 - 74%     Toileting Toileting    Toileting assist Assist for toileting: Maximal Assistance - Patient 25 - 49% (standing toilet use and clothing management)     Transfers Chair/bed transfer  Transfers assist  Chair/bed transfer activity did not occur: Safety/medical concerns  Chair/bed transfer assist level: Moderate Assistance - Patient 50 - 74%     Locomotion Ambulation   Ambulation assist      Assist level: Maximal Assistance - Patient 25 - 49% Assistive device: Hand held assist (+2) Max distance: 30 ft   Walk 10 feet activity   Assist     Assist level: Maximal Assistance - Patient 25 - 49% Assistive device: Hand held assist (+2)   Walk 50 feet activity   Assist Walk 50 feet with 2 turns activity did not occur: Safety/medical concerns         Walk 150 feet activity   Assist Walk 150 feet activity did  not occur: Safety/medical concerns         Walk 10 feet on uneven surface  activity   Assist Walk 10 feet on uneven surfaces activity did not occur: Safety/medical concerns         Wheelchair     Assist Is the patient using a wheelchair?: Yes Type of Wheelchair: Manual    Wheelchair assist level: Dependent - Patient 0% Max wheelchair distance: 250 ft    Wheelchair 50 feet with 2 turns activity    Assist        Assist Level: Dependent - Patient 0%   Wheelchair 150 feet activity     Assist      Assist Level: Dependent - Patient 0%   Blood pressure 121/81, pulse 72, temperature 98.9 F (37.2 C), temperature source Oral, resp. rate 18, height 5' 6 (1.676 m), weight 60.8 kg, SpO2 100%.  Medical Problem List and Plan: 1. 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 Will order PRAFO to use at night  - Stable to continue inpatient rehab Team conf in am  2.  Antithrombotics: -DVT/anticoagulation:  Pharmaceutical: Lovenox  may d/c amb 100'             -antiplatelet therapy: DAPT X 3 weeks followed by ASA alone. Starting ASA alone 7/1 3. Pain Management: Tylenol  prn.  4. Mood/Behavior/Sleep: LCSW to follow for evaluation and support.              -antipsychotic agents: N/A--agitation has resolved.  5. Neuropsych/cognition: This patient is capable of making decisions on his own behalf. 6. Skin/Wound Care: Routine pressure relief measures.  7. Fluids/Electrolytes/Nutrition: Monitor I/O. Check CMET in am.  8.  HTN: Monitor BP TID and slowly  normalize over next few week per nephrology             --on Amlodipine  10, metoprolol  50 mg bid and  hydralazine  100 mg every 8 hrs. Vitals:   03/09/24 2016 03/10/24 0526  BP: 129/73 121/81  Pulse: 72   Resp: 18 18  Temp: 98.8 F (37.1 C) 98.9 F (37.2 C)  SpO2: 100% 100%   7/1 Controlled continue amlodipine  10 mg, hydralazine  100 mg, metoprolol  50 mg per nephrology.    9. Dysphagia: D3, nectar (06/16) with head turn to left for all liquids. --Full supervision for safety and aspiration precautions. -6/28 was upgraded thin liquids on 6/26  -6/29 appears to be eating most of his meals  10. 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  Sent studies for atypical HUS. BUN/Cr still elevated over baseline but no additional tx recommended other  than weekly check.  Will rec Nephro f/u as OP  BMP tomorrow  -6/28  Will discontinue bladder scans, appears to be stable      Latest Ref Rng & Units 03/09/2024    5:23 AM 03/03/2024    4:59 AM 03/02/2024    7:58 PM  BMP  Glucose 70 - 99 mg/dL 93  95  891   BUN 6 - 20 mg/dL 29  32  34   Creatinine 0.61 - 1.24 mg/dL 7.06  7.24  7.27   Sodium 135 - 145 mmol/L 137  137  139   Potassium 3.5 - 5.1 mmol/L 4.1  3.9  4.1   Chloride 98 - 111 mmol/L 103  105  106   CO2 22 - 32 mmol/L 27  28  25    Calcium  8.9 - 10.3 mg/dL 9.0  8.7  9.0      11. HIV disease: Continue BIKTARVY  50-200-25. Now followed by Dr. CHRISTELLA. Singh.  12. Anemia of chronic disease: Likely due to HIV/CKD. Will recheck in am.  Hgb stable  13. Thrombocytopenia: Platelets 51 at admission. Now up to 147.  --Monitor for recovery 14. Hypocalcemia: Ionized calcium  1.08. on supplement, corrected to 9.0 when factoring in low alb 15. Hyponatremia: mild/borderline, improved   16. Tobacco use: Nicotine  patch. Encourage nicotine  cessation.   LOS: 14 days A FACE TO FACE EVALUATION WAS PERFORMED  Prentice FORBES Compton 03/10/2024, 7:39 AM

## 2024-03-10 NOTE — Progress Notes (Signed)
 Physical Therapy Session Note  Patient Details  Name: Juan Townsend MRN: [id number] Date of Birth: 1975/12/29  Today's Date: 03/10/2024 PT Individual Time: 1032- 1117 PT Individual Time Calculation (min): 45 min  Short Term Goals: Week 2:  PT Short Term Goal 1 (Week 2): Pt will perform bed mobility from flat mattress with overall SBA. PT Short Term Goal 2 (Week 2): Pt will perform standing transfers with overall CGA/ MinA. PT Short Term Goal 3 (Week 2): Pt will perform squat pivot transfers with SBA. PT Short Term Goal 4 (Week 2): Pt will perform w/c mobility with supervision. PT Short Term Goal 5 (Week 2): pt will ambulate up to 200' with CGA using RW.   Skilled Therapeutic Interventions/Progress Updates:  Patient seated upright in w/c on entrance to room. Patient alert and agreeable to PT session. BIL sleeping in recliner.   Patient with no pain complaint at start of session.  Therapeutic Activity/ NMR: Transfers: Pt performed squat and stand pivot transfers throughout session and pt continues to require vc for setup in order to improve safety and LOA from MinA to SBA. Pt is able to perform sit<>stand with SBA and improved ability to attain balance on initial stance to no AD. Is also able to perform sit<>stand to RW with focus on L hand placement to saddle splint with increased control to LUE. Continued to educate re: setup prior to performance for improved LOA and to maintain control through slower movements.   Gait Training:  Pt ambulated 55 ft with no AD and overall CGA. Increased effort noted in L step progression and then reduced motor control with LLE bounce in stance phase that diminishes over first half of bout and then increases again with LLE fatigue. Pt then able to ambulate using RW for return distance and slight increase in pace d/t improved UE support. Pt continues to pause RW advancement during L stance phase which limits R step distance.   Pt ambulates 180 feet using RW  with 5# aw to LLE using RW with L hand saddle splint.  Demonstrated decreased ataxic bounce to LLE. Bout ended d/t fatigue. Provided vc/ tc for increasing RLE step length and maintaining RW steady progression.   NMR performed for improvements in motor control and coordination, balance, sequencing, judgement, and self confidence/ efficacy in performing all aspects of mobility at highest level of independence.   Wheelchair Mobility:  Pt propelled wheelchair 125 feet with supervision using hemitechnique. Provided vc only for improved technique to help maintain straight path.   Patient seated upright in w/c at end of session with brakes locked, belt alarm set, and all needs within reach.   Therapy Documentation Precautions:  Precautions Precautions: Fall Recall of Precautions/Restrictions: Impaired Precaution/Restrictions Comments: L hemipareisis, permissive HTN, HIV (+) Restrictions Weight Bearing Restrictions Per Provider Order: No  Pain: No pain related this session.    Therapy/Group: Individual Therapy  [name] PT, DPT, CSRS 03/10/2024, 5:51 AM

## 2024-03-10 NOTE — Progress Notes (Signed)
 Occupational Therapy Weekly Progress Note  Patient Details  Name: Juan Townsend MRN: [id number] Date of Birth: 1976/09/02  Beginning of progress report period: March 03, 2024 End of progress report period: March 10, 2024  Today's Date: 03/10/2024 OT Individual Time: 9144-9049 OT Individual Time Calculation (min): 55 min    Patient has met 3 of 3 short term goals. Mr. Marchio is making steady progress towards reaching his LTGs. He is completing UB dressing SUP, LB dressing min A progressing to CGA, toileting MOD progressing to MIN A using RW for balance, and U/LB bathing with CGA when seated on shower seat using grab bar. Pt is completing toilet, shower, and wc transfers with CGA overall +increased time and verbal cues for technique. He is demonstrating improved functional use of his L UE and able to utilize it during functional activities at gross assist level with SUP.   Patient continues to demonstrate the following deficits: muscle weakness and muscle joint tightness, decreased cardiorespiratoy endurance, impaired timing and sequencing, abnormal tone, unbalanced muscle activation, decreased coordination, and decreased motor planning, decreased midline orientation and decreased motor planning, decreased attention, decreased problem solving, and decreased safety awareness, central origin, and decreased sitting balance, decreased standing balance, decreased postural control, hemiplegia, and decreased balance strategies and therefore will continue to benefit from skilled OT intervention to enhance overall performance with BADL and Reduce care partner burden.  Patient progressing toward long term goals..  Continue plan of care.  OT Short Term Goals Week 2:  OT Short Term Goal 1 (Week 2): patient will utilize LUE as stabilizer in daily activities with CGA OT Short Term Goal 1 - Progress (Week 2): Met OT Short Term Goal 2 (Week 2): patient will demonstrate ability to perform transfers to wc, toilet,  shower with CGA OT Short Term Goal 2 - Progress (Week 2): Met OT Short Term Goal 3 (Week 2): patient will demonstrate increased standing balance evidenced by ability to don pants with min A in sititng/ standing OT Short Term Goal 3 - Progress (Week 2): Met Week 3:  OT Short Term Goal 1 (Week 3): STG=LTG d/t ELOS  Skilled Therapeutic Interventions/Progress Updates:     Pt received semi-reclined in bed presenting to be in good spirits receptive to skilled OT session reporting 0/10 pain- OT offering intermittent rest breaks, repositioning, and therapeutic support to optimize participation in therapy session. Pt requesting to take shower- focused this session on ADL retraining to increase overall independence and decrease bourdon of care. Pt with improved recall of log rolling technique and able to come sitting EOB with HOB elevated with SUP +increased time. Squat pivot to R light min A +verbal cues for set-up and technique. Transport to bathroom total A in wc for energy conservation. Stand pivot wc <> shower seat positioned in walk-in shower using grab bar CGA +verbal cues for technique. Pt doffed pants from waist in standing using grab bar with min A provided for balance and doffed shirt while seated with SUP. Majority of U/LB bathing completed in seated position for energy conservation and increased safety. Pt demonstrating sufficient dynamic sitting balance to reach towards ground to wash lower B LEs with CGA. Facilitated NMRE to L UE by incorporating L UE into functional bathing tasks to wash R UE, chest, abdomin, and L LE. Pt also able to intermittently utilize L UE to manage showerhead with min A for improved control. Improved maintained grasp and shoulder flexion noted this session. Following shower, Pt dried self with min A and  completed U/LB dressing seated in wc. Pt able to recall hemi-dressing technique for UB dressing donning shirt with set-up A +increased time. Min verbal cues required to recall LB  hemi-dressing technique donning pants with min A to fully adjust over L hip. Pt able to maintain standing balance during ADLs with min A overall. While seated at sink, Pt completed oral care, applied lotion, and groomed hair with SUP using hemi-techniques utilizing L UE at a gross assist level. Pt was left resting in wc with call bell in reach, seatbelt alarm on, and all needs met.    Therapy Documentation Precautions:  Precautions Precautions: Fall Recall of Precautions/Restrictions: Impaired Precaution/Restrictions Comments: L hemipareisis, permissive HTN, HIV (+) Restrictions Weight Bearing Restrictions Per Provider Order: No   Therapy/Group: Individual Therapy  Katheryn SHAUNNA Mines 03/10/2024, 7:52 AM

## 2024-03-11 ENCOUNTER — Encounter (HOSPITAL_COMMUNITY): Payer: Self-pay | Admitting: Physical Medicine & Rehabilitation

## 2024-03-11 NOTE — Progress Notes (Signed)
 PROGRESS NOTE   Subjective/Complaints:  No issues overnite   ROS- neg Chills, CP, SOB, N/V/D, abdominal pain      03/11/2024    6:28 AM 03/11/2024    5:00 AM 03/10/2024    7:43 PM  Vitals with BMI  Weight  137 lbs 6 oz   BMI  22.18   Systolic 118  125  Diastolic 77  79  Pulse 71  73      Objective:   No results found.  Recent Labs    03/09/24 0523  WBC 5.5  HGB 8.6*  HCT 26.0*  PLT 384    Recent Labs    03/09/24 0523  NA 137  K 4.1  CL 103  CO2 27  GLUCOSE 93  BUN 29*  CREATININE 2.93*  CALCIUM  9.0     Intake/Output Summary (Last 24 hours) at 03/11/2024 0828 Last data filed at 03/11/2024 0700 Gross per 24 hour  Intake 600 ml  Output 1275 ml  Net -675 ml        Physical Exam: Vital Signs Blood pressure 118/77, pulse 71, temperature 98.1 F (36.7 C), temperature source Oral, resp. rate 18, height 5' 6 (1.676 m), weight 62.3 kg, SpO2 100%.   General: No acute distress.  Sitting in wheelchair working with therapy Mood and affect are appropriate Heart: Regular rate and rhythm no rubs murmurs or extra sounds Lungs: Clear to auscultation, breathing unlabored, no rales or wheezes Abdomen: Positive bowel sounds, soft nontender to palpation, nondistended Extremities: No clubbing, cyanosis, or edema Skin: No evidence of breakdown, no evidence of rash  Neurologic: Awake, alert, oriented x 4.  Usually answers in 1 or 2 word answers Cranial nerves II through XII intact,  motor strength is 5/5 on the right 3- left delt, bi, tri grip, 3- knee ext and hip flexion  0/5 at foot and ankle- improving motor control Sensory exam normal sensation to light touch and proprioception in bilateral upper and reduced LT lower extremities  No pain or with knee ROM, no pain to palp over the hamstrings Musculoskeletal: Full range of motion in all 4 extremities. No joint swelling  Physical exam unchanged from the above  on reexamination 03/11/24    Assessment/Plan: 1. Functional deficits which require 3+ hours per day of interdisciplinary therapy in a comprehensive inpatient rehab setting. Physiatrist is providing close team supervision and 24 hour management of active medical problems listed below. Physiatrist and rehab team continue to assess barriers to discharge/monitor patient progress toward functional and medical goals  Care Tool:  Bathing    Body parts bathed by patient: Left arm, Chest, Abdomen, Front perineal area, Right upper leg, Left upper leg, Face, Left lower leg, Right lower leg, Right arm, Buttocks   Body parts bathed by helper: Right arm, Buttocks     Bathing assist Assist Level: Contact Guard/Touching assist     Upper Body Dressing/Undressing Upper body dressing   What is the patient wearing?: Pull over shirt    Upper body assist Assist Level: Supervision/Verbal cueing    Lower Body Dressing/Undressing Lower body dressing      What is the patient wearing?: Incontinence brief, Pants  Lower body assist Assist for lower body dressing: Minimal Assistance - Patient > 75%     Toileting Toileting    Toileting assist Assist for toileting: Moderate Assistance - Patient 50 - 74%     Transfers Chair/bed transfer  Transfers assist  Chair/bed transfer activity did not occur: Safety/medical concerns  Chair/bed transfer assist level: Moderate Assistance - Patient 50 - 74%     Locomotion Ambulation   Ambulation assist      Assist level: Maximal Assistance - Patient 25 - 49% Assistive device: Hand held assist (+2) Max distance: 30 ft   Walk 10 feet activity   Assist     Assist level: Maximal Assistance - Patient 25 - 49% Assistive device: Hand held assist (+2)   Walk 50 feet activity   Assist Walk 50 feet with 2 turns activity did not occur: Safety/medical concerns         Walk 150 feet activity   Assist Walk 150 feet activity did not occur:  Safety/medical concerns         Walk 10 feet on uneven surface  activity   Assist Walk 10 feet on uneven surfaces activity did not occur: Safety/medical concerns         Wheelchair     Assist Is the patient using a wheelchair?: Yes Type of Wheelchair: Manual    Wheelchair assist level: Dependent - Patient 0% Max wheelchair distance: 250 ft    Wheelchair 50 feet with 2 turns activity    Assist        Assist Level: Dependent - Patient 0%   Wheelchair 150 feet activity     Assist      Assist Level: Dependent - Patient 0%   Blood pressure 118/77, pulse 71, temperature 98.1 F (36.7 C), temperature source Oral, resp. rate 18, height 5' 6 (1.676 m), weight 62.3 kg, SpO2 100%.  Medical Problem List and Plan: 1. 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 Will order PRAFO to use at night  - Stable to continue inpatient rehab Team conference today please see physician documentation under team conference tab, met with team  to discuss problems,progress, and goals. Formulized individual treatment plan based on medical history, underlying problem and comorbidities.  2.  Antithrombotics: -DVT/anticoagulation:  Pharmaceutical: Lovenox  may d/c amb 100'             -antiplatelet therapy: DAPT X 3 weeks followed by ASA alone. Starting ASA alone 7/1 3. Pain Management: Tylenol  prn.  4. Mood/Behavior/Sleep: LCSW to follow for evaluation and support.              -antipsychotic agents: N/A--agitation has resolved.  5. Neuropsych/cognition: This patient is capable of making decisions on his own behalf. 6. Skin/Wound Care: Routine pressure relief  measures.  7. Fluids/Electrolytes/Nutrition: Monitor I/O. Check CMET in am.  8.  HTN: Monitor BP TID and slowly  normalize over next few week per nephrology             --on Amlodipine  10, metoprolol  50 mg bid and hydralazine  100 mg  every 8 hrs. Vitals:   03/10/24 1943 03/11/24 0628  BP: 125/79 118/77  Pulse: 73 71  Resp: 20 18  Temp: 99 F (37.2 C) 98.1 F (36.7 C)  SpO2: 100% 100%   7/1 Controlled continue amlodipine  10 mg, hydralazine  100 mg, metoprolol  50 mg per nephrology.    9. Dysphagia: improved tolerating regular diet and thin liquids   10. 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  Sent studies for atypical HUS. BUN/Cr still elevated over baseline but no additional tx recommended other  than weekly check.  Will rec Nephro f/u as OP  BMP tomorrow  -6/28  Will discontinue bladder scans, appears to be stable      Latest Ref Rng & Units 03/09/2024    5:23 AM 03/03/2024    4:59 AM 03/02/2024    7:58 PM  BMP  Glucose 70 - 99 mg/dL 93  95  891   BUN 6 - 20 mg/dL 29  32  34   Creatinine 0.61 - 1.24 mg/dL 7.06  7.24  7.27   Sodium 135 - 145 mmol/L 137  137  139   Potassium 3.5 - 5.1 mmol/L 4.1  3.9  4.1   Chloride 98 - 111 mmol/L 103  105  106   CO2 22 - 32 mmol/L 27  28  25    Calcium  8.9 - 10.3 mg/dL 9.0  8.7  9.0      11. HIV disease: Continue BIKTARVY  50-200-25. Now followed by Dr. CHRISTELLA. Singh.  12. Anemia of chronic disease: Likely due to HIV/CKD. Will recheck in am.  Hgb stable  13. Thrombocytopenia: Platelets 51 at admission. Now up to 147.  --Monitor for recovery 14. Hypocalcemia: Ionized calcium  1.08. on supplement, corrected to 9.0 when factoring in low alb 15. Hyponatremia: mild/borderline, improved   16. Tobacco use: Nicotine  patch. Encourage nicotine  cessation.   LOS: 15 days A FACE TO FACE EVALUATION WAS PERFORMED  Prentice FORBES Compton 03/11/2024, 8:28 AM

## 2024-03-11 NOTE — Progress Notes (Signed)
 Speech Language Pathology Daily Session Note  Patient Details  Name: Juan Townsend MRN: [id number] Date of Birth: [birth year]  Today's Date: 03/11/2024 SLP Individual Time: 1430-1530 SLP Individual Time Calculation (min): 60 min  Short Term Goals: Week 2: SLP Short Term Goal 1 (Week 2): STGs=LTGs d/t ELOS  Skilled Therapeutic Interventions:   Pt greeted at bedside after PT tx session. He was very pleasant and cooperative throughout tx tasks targeting cognition.  He completed written tasks of mild complexity targeting money and time management. He was able to complete the money calculation task (via menu) independently given additional processing time. He required only s cues for information processing and attention to details during time calculation task. He was able to ID his errors, however, unable to adequately self correct. During conversational task re upcoming d/c, he required only s cues for complex problem solving and planning. At the end of tx tasks, he was left in bed with the alarm set and call light within reach. Recommend cont ST per POC.   Pain  None reported  Therapy/Group: Individual Therapy  [name] 03/11/2024, 3:20 PM

## 2024-03-11 NOTE — Patient Care Conference (Addendum)
 Inpatient RehabilitationTeam Conference and Plan of Care Update Date: 03/11/2024   Time: 10:51 AM    Patient Name: Juan Townsend      Medical Record Number: [id number]  Date of Birth: 1975/12/04 Sex: Male         Room/Bed: 5T90R/5T90R-98 Payor Info: Payor: /    Admit Date/Time:  02/25/2024  2:36 PM  Primary Diagnosis:  Acute right ACA stroke [facility])  Hospital Problems: Principal Problem:   Acute right ACA stroke [facility]) Active Problems:   Cognitive change    Expected Discharge Date: Expected Discharge Date: 03/18/24  Team Members Present: Physician leading conference: Dr. Prentice Compton Social Worker Present: Rhoda Clement, LCSW Nurse Present: Barnie Ronde, RN PT Present: Recardo Milliner, PT OT Present: Katheryn Mines, OT SLP Present: Recardo Mole, SLP     Current Status/Progress Goal Weekly Team Focus  Bowel/Bladder   Pt continent x2 with some episodes of incontinence at night   Pt will maintain continent and gain full continence over night   Scheduled toileting q 4 hours and prn    Swallow/Nutrition/ Hydration   reg/thin, meds whole in puree, no straws   mod i  tolerance and education    ADL's   UB Dressing SUP, LB dressing min A progressing to CGA, toileitng MOD A, U/LB bathing CGA; squat pivots to wc, shower, toilet CGA; improved shoudler flexion and digit flex/extension // Barriers: improved affect and participation overall, family has not participated in education, body awareness   Supervision   L NMRE, functional transfers, dynamic balance, ADL retraining    Mobility   Bed mobility = supervision/ ModI, Transfers = CGA with max cues for safe setup, Ambulation more than 100 ft using RW with L hand saddle splint and MinA. MinA for steps. Cued for posture and sequential muscle activation   supervision for transfers, CGA/ MinA for gait  Barriers: urinary incontinence, L hemibody weakness /// Work on: improving standing balance, LOA in transfers, quality of gait, NMR to L  hemibody, pt/ family education    Communication                Safety/Cognition/ Behavioral Observations  modA   minA   orientation, problem solving, memory, awareness    Pain   Pt has no complaints of pain   Maintain pain control   Pain assessmend q shift and prn    Skin   Skin is intact   Maintain skin integrity  Skin assessments q shift and prn      Discharge Planning:  Have scheduled famly training wiht brother and sister in-law for MOnday from 59-12. Aware he will need supervision but both work third and will be sleeping during the day. 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   Patient on target to meet rehab goals: yes, currently limited by tonic bounce on the left with fatigue.  Left UE awareness improved and affect is better.  Needs CGA for squat pivot transfers.  Needs supervision for upper body care and min - CGA for lower body care but min - mod assist for toileting. Continue to work on problem solving, memory and awareness. Goals for discharge set for supervision overall with CGA for gait.  *See Care Plan and progress notes for long and short-term goals.   Revisions to Treatment Plan:  Renal status stable;  OP follow up referral   Teaching Needs: Safety, medications, transfers, toileting, etc.   Current Barriers to Discharge: Decreased caregiver support and Home enviroment access/layout  Possible Resolutions to Barriers: Family education DME: TTB and BSC, W/C, RW     Medical Summary Current Status: No pain problems, has had constipation but this has improved.  Continues to have left foot drop, left arm and lower extremity weakness improving     Possible Resolutions to [employer] Focus: Consider Orthotic management of left foot drop   Continued Need for Acute Rehabilitation  Level of Care: The patient requires daily medical management by a physician with specialized training in physical medicine and rehabilitation for the following reasons: Direction of a multidisciplinary physical rehabilitation program to maximize functional independence : Yes Medical management of patient stability for increased activity during participation in an intensive rehabilitation regime.: Yes Analysis of laboratory values and/or radiology reports with any subsequent need for medication adjustment and/or medical intervention. : Yes   I attest that I was present, lead the team conference, and concur with the assessment and plan of the team.   Fredericka Sober B 03/11/2024, 3:42 PM

## 2024-03-11 NOTE — Progress Notes (Signed)
 Physical Therapy Session Note  Patient Details  Name: Juan Townsend MRN: [id number] Date of Birth: Aug 03, 1976  Today's Date: 03/11/2024 PT Individual Time: 0901-1005 PT Individual Time Calculation (min): 64 min   Short Term Goals: Week 2:  PT Short Term Goal 1 (Week 2): Pt will perform bed mobility from flat mattress with overall SBA. PT Short Term Goal 2 (Week 2): Pt will perform standing transfers with overall CGA/ MinA. PT Short Term Goal 3 (Week 2): Pt will perform squat pivot transfers with SBA. PT Short Term Goal 4 (Week 2): Pt will perform w/c mobility with supervision. PT Short Term Goal 5 (Week 2): pt will ambulate up to 200' with CGA using RW.   Skilled Therapeutic Interventions/Progress Updates:  Patient seated upright in w/c on entrance to room. Patient alert and agreeable to PT session.   Patient with no pain complaint at start of session.Does relate being fatigued already this morning despite no therapy sessions yet.  Wheelchair Mobility:  Discussed potential need for wheelchair and decided to work on w/c mobility for start of session. Pt propelled wheelchair 110 feet with CGA/ MinA d/t reduced coordination of RUE/ RLE and inability to produce effective downward pressure into RLE. Provided vc/ tc throughout for sequencing and technique. Very slow to progress. At end of session, related potential need for change in w/c to more appropriate size. Will acquire in next therapy session this afternoon.  Therapeutic Activity: Focus this session on execution of transfers with assessment of pt's ability to safely setup for improved efficiency. Pt performs squat pivot transfers w/c<>mat table to R and L sides. Is very light CGA for transfer to R side but continues to require minimal vc for hand placement. Initially unable to power up but repositions LEs without cues. Transfer to L side requires mod vc especially for use of LUE now that pt has increased use and ability to use with increased  focus. CGA to complete.   Patient seated upright in w/c at end of session with brakes locked, belt alarm set, and all needs within reach.   Therapy Documentation Precautions:  Precautions Precautions: Fall Recall of Precautions/Restrictions: Impaired Precaution/Restrictions Comments: L hemipareisis, permissive HTN, HIV (+) Restrictions Weight Bearing Restrictions Per Provider Order: No  Pain: Pain Assessment Pain Scale: 0-10 Pain Score: 0-No pain related this session.    Therapy/Group: Individual Therapy  [name] PT, DPT, CSRS 03/11/2024, 10:16 AM

## 2024-03-11 NOTE — Progress Notes (Signed)
 Occupational Therapy Session Note  Patient Details  Name: Juan Townsend MRN: [id number] Date of Birth: [birth year]  Today's Date: 03/11/2024 OT Individual Time: 1106-1200 OT Individual Time Calculation (min): 54 min    Short Term Goals: Week 2:  OT Short Term Goal 1 (Week 2): patient will utilize LUE as stabilizer in daily activities with CGA OT Short Term Goal 1 - Progress (Week 2): Met OT Short Term Goal 2 (Week 2): patient will demonstrate ability to perform transfers to wc, toilet, shower with CGA OT Short Term Goal 2 - Progress (Week 2): Met OT Short Term Goal 3 (Week 2): patient will demonstrate increased standing balance evidenced by ability to don pants with min A in sititng/ standing OT Short Term Goal 3 - Progress (Week 2): Met  Skilled Therapeutic Interventions/Progress Updates:     Pt received sitting up in wc, dressed for the day declining need for shower or bathroom. Pt presenting to be in good spirits receptive to skilled OT session reporting 0/10 pain- OT offering intermittent rest breaks, repositioning, and therapeutic support to optimize participation in therapy session. Focused this session on functional movement patterns and L hemibody NMRE to improve overall motor control and body awareness for improved participation in ADLs. Transported Pt total A to therapy gym in wc for energy conservation. Squat pivot wc > EOM to L CGA +mod verbal cues for technique. Educated Pt on technique to transition into quadruped position with Pt able to complete with min A +mod verbal cues for technique and body positioning- he was able to transition from sitting EOM <> quadruped with with CGA-MIN A x6 trials during session. From quadruped position Pt completed 2x10 reps of cat/cows and modified dead bugs alternating lifting R/L UE and then R/L LE- min A required overall to stabilize L UE and tactile cues for glute engagement on L LE. Spent time working in high kneeling to work on glute/trunk  stability in combination with functional reaching. In high kneeling with B UE supported on bench, engaged Pt in functional reaching activity with crossing midline incorporated into task- Pt able to maintain WB'ing through L UE while reaching with R UE with CGA-min A for muscle activation and utilize L UE to retrieve cones on R side of bench with min/mod verbal and tactile cues for improved muscle activation. From high kneeling position, engaged Pt in utilizing L UE to reach across midline to retrieve squigz from window with Pt able to complete 10 trials with CGA-intermittent min A provided to decrease compensatory shoulder elevation/lateral trunk flexion and maintain grasp on squigz +mod verbal cues for technique. Seated rest breaks provided between each activity for energy conservation. Improved functional use of L UE noted overall, however Pt continues to require verbal/tactile cues for improved movement patterns. Squat pivot EOM > wc to R CGA +min verbal cues for technique. Pt was left resting in wc with call bell in reach, seatbelt alarm on, and all needs met.    Therapy Documentation Precautions:  Precautions Precautions: Fall Recall of Precautions/Restrictions: Impaired Precaution/Restrictions Comments: L hemipareisis, permissive HTN, HIV (+) Restrictions Weight Bearing Restrictions Per Provider Order: No   Therapy/Group: Individual Therapy  Katheryn SHAUNNA Mines 03/11/2024, 8:02 AM

## 2024-03-11 NOTE — Progress Notes (Signed)
 Pt LBM 6/28.  Offered stool softener, pt refused this morning. No abd pain, passing gas.

## 2024-03-11 NOTE — Progress Notes (Signed)
 Pt resting in bed, Prafo boot and L hand splint applied at bedtime.  During my round, found L hand splint on the floor next to the bed.  Prafo boot still in place.  Pt using urinal independently throughout the night. No pain complaints

## 2024-03-11 NOTE — Progress Notes (Signed)
 Physical Therapy Session Note  Patient Details  Name: Juan Townsend MRN: [id number] Date of Birth: 06/18/1976  Today's Date: 03/10/2024 PT Individual Time: 1400-1501 PT Individual Time Calculation (min): 61 min  Short Term Goals: Week 2:  PT Short Term Goal 1 (Week 2): Pt will perform bed mobility from flat mattress with overall SBA. PT Short Term Goal 2 (Week 2): Pt will perform standing transfers with overall CGA/ MinA. PT Short Term Goal 3 (Week 2): Pt will perform squat pivot transfers with SBA. PT Short Term Goal 4 (Week 2): Pt will perform w/c mobility with supervision. PT Short Term Goal 5 (Week 2): pt will ambulate up to 200' with CGA using RW.   Skilled Therapeutic Interventions/Progress Updates:  Patient seated upright in w/c on entrance to room. Patient alert and agreeable to PT session. SPT providing CGA during ambulation.   Patient with no pain complaint at start of session.  Therapeutic Activity/ NMR: Transfers: Pt with difficulty performing sit<>stand from w/c in room. Pt positions L hand in saddle splint first and then attempts to stand with BUE on RW. Unable to lift from seat despite several attempts. Reminded pt to place feet posteriorly to knees and to place R hand on w/c armrest in order to assist with power up and forward.   Requires Mina to position w/c at mat table for improved postioning prior to squat pivot transfer. He requires MinA for remembering all pieces of body placement for setup.    Gait Training:  Pt ambulated 50 ft using no AD with CGA to pt's hips from SPT and intermittent NDT cueing from this therapist. Pt demonstrated R trunk flexion indicating increase use of R postural musculature and decreased activation in L trunk. Provided NDT cueing to correct posture with upward pressure at R ribcage and downward cueing at L lower traps, lats and QL in order to bring posture closer to midline. Also provided vc for more improvement and understanding from pt. Then  provided pt with verbal description and visual demonstration before next ambulation bout to demonstrate need for continuous push of RW in order to progress more fluid stepping and to improve LLE advancement. Pt is able to ambulate just under 100 ft in direction back to room from therapy gym with CGA and tc for posture as well as vc for widening narrow step width pt produces.   Neuromuscular Re-ed: NMR facilitated during session with focus on standing balance, muscle fiber fascilitation, coordination, and motor control. Pt guided in lateral step outs with LLE to reach out to targets on table top placed diagonally to pt's L side. Pt is able to step out with minimal difficulty with CGA but requires significant cueing for sequencing in order to bring LLE back to initial position. Is able to place L hand to correct target with no physical assist and only one instance of LOB d/t inability with max effort to step LLE back to midline. NMR performed for improvements in motor control and coordination, balance, sequencing, judgement, and self confidence/ efficacy in performing all aspects of mobility at highest level of independence.   Patient supine in bed at end of session with brakes locked, bed alarm set, and all needs within reach.   Therapy Documentation Precautions:  Precautions Precautions: Fall Recall of Precautions/Restrictions: Impaired Precaution/Restrictions Comments: L hemipareisis, permissive HTN, HIV (+) Restrictions Weight Bearing Restrictions Per Provider Order: No  Pain: Pain Assessment Pain Scale: 0-10 Pain Score: 0-No pain related this session.   Therapy/Group: Individual Therapy  [name] PT, DPT, CSRS 03/10/2024, 7:17 AM

## 2024-03-12 NOTE — Plan of Care (Signed)
  Problem: RH BOWEL ELIMINATION Goal: RH STG MANAGE BOWEL WITH ASSISTANCE Description: STG Manage Bowel with toileting Assistance. Outcome: Progressing Goal: RH STG MANAGE BOWEL W/MEDICATION W/ASSISTANCE Description: STG Manage Bowel with Medication with mod I Assistance. Outcome: Progressing   Problem: RH SAFETY Goal: RH STG ADHERE TO SAFETY PRECAUTIONS W/ASSISTANCE/DEVICE Description: STG Adhere to Safety Precautions With cues Assistance/Device. Outcome: Progressing

## 2024-03-12 NOTE — Progress Notes (Signed)
 Speech Language Pathology Daily Session Note  Patient Details  Name: Juan Townsend MRN: [id number] Date of Birth: 1976-05-16  Today's Date: 03/12/2024 SLP Individual Time: 9199-9154 8599-8570 SLP Individual Time Calculation (min): 45 min 29 minutes  Short Term Goals: Week 3: SLP Short Term Goal 1 (Week 3): STG = LTG due to ELOS  Skilled Therapeutic Interventions: Session 1: Skilled therapy session focused on cognitive goals. SLP re-evaluated patient via the Cognistat due to improvement in cognitive skills since evaluation. On evaluation, patient scored with moderate-severe cognitive deficits in the subtests of orientation, attention, calculations, executive functioning, registration and delayed recall. Today, patient scored WFL on all subtests with the exception of mild deficits in attention. Patient with occasional delayed processing and reports he is not quite back to baseline cognitively, therefore recommend continued ST services targeting high level cognitive skills. At the end of the session, SLP initiated completion of vacation budgeting task. Patient began creating budget by choosing destination and listing items to include in budget. Patient required minA to complete this portion of the activity. Patient left in bed with alarm set and call bell in reach. Continue POC. Session 2: Skilled therapy session focused on cognitive goals. SLP facilitated session by prompting completion of budgeting task initiated in prior session. Patient utilized internet with physical assistance from SLP to determine in budget hotel, transportation, food cost and activities. Patient then completed simple addition and multiplication given supervisionA. Patient left in [facility] with alarm set and call bell in reach. Continue POC.  Pain Session 1: Denies  Session 2: Denies  Therapy/Group: Individual Therapy  Malky Rudzinski M.A., CCC-SLP 03/12/2024, 7:34 AM

## 2024-03-12 NOTE — Progress Notes (Signed)
 PROGRESS NOTE   Subjective/Complaints:  Pt asked about care team , discussed results as well as lack of ins coverage for OP therapy  Discussed possibility of PT school pro bono clinics at [facility] and [employer]   ROS- neg Chills, CP, SOB, N/V/D, abdominal pain      03/12/2024    6:09 AM 03/12/2024    5:15 AM 03/11/2024    9:42 PM  Vitals with BMI  Weight  138 lbs 11 oz   BMI  22.39   Systolic 126 126 870  Diastolic 79 79 87  Pulse  58       Objective:   No results found.  No results for input(s): WBC, HGB, HCT, PLT in the last 72 hours.   No results for input(s): NA, K, CL, CO2, GLUCOSE, BUN, CREATININE, CALCIUM  in the last 72 hours.    Intake/Output Summary (Last 24 hours) at 03/12/2024 0844 Last data filed at 03/12/2024 0800 Gross per 24 hour  Intake 720 ml  Output 1150 ml  Net -430 ml        Physical Exam: Vital Signs Blood pressure 126/79, pulse (!) 58, temperature 97.9 F (36.6 C), resp. rate 18, height 5' 6 (1.676 m), weight 62.9 kg, SpO2 100%.   General: No acute distress.  Sitting in wheelchair working with therapy Mood and affect are appropriate Heart: Regular rate and rhythm no rubs murmurs or extra sounds Lungs: Clear to auscultation, breathing unlabored, no rales or wheezes Abdomen: Positive bowel sounds, soft nontender to palpation, nondistended Extremities: No clubbing, cyanosis, or edema Skin: No evidence of breakdown, no evidence of rash  Neurologic: Awake, alert, oriented x 4.  Usually answers in 1 or 2 word answers Cranial nerves II through XII intact,  motor strength is 5/5 on the right 3- left delt, bi, tri grip, 3- knee ext and hip flexion  trace/5 at foot and ankle- FIne motor LUE reduced but able to do pincer grasp, weakly  Sensory exam normal sensation to light touch and proprioception in bilateral upper and reduced LT lower extremities  No pain  or with knee ROM, no pain to palp over the hamstrings Musculoskeletal: Full range of motion in all 4 extremities. No joint swelling  Physical exam unchanged from the above on reexamination 03/12/24    Assessment/Plan: 1. Functional deficits which require 3+ hours per day of interdisciplinary therapy in a comprehensive inpatient rehab setting. Physiatrist is providing close team supervision and 24 hour management of active medical problems listed below. Physiatrist and rehab team continue to assess barriers to discharge/monitor patient progress toward functional and medical goals  Care Tool:  Bathing    Body parts bathed by patient: Left arm, Chest, Abdomen, Front perineal area, Right upper leg, Left upper leg, Face, Left lower leg, Right lower leg, Right arm, Buttocks   Body parts bathed by helper: Right arm, Buttocks     Bathing assist Assist Level: Contact Guard/Touching assist     Upper Body Dressing/Undressing Upper body dressing   What is the patient wearing?: Pull over shirt    Upper body assist Assist Level: Supervision/Verbal cueing    Lower Body Dressing/Undressing Lower body dressing  What is the patient wearing?: Incontinence brief, Pants     Lower body assist Assist for lower body dressing: Minimal Assistance - Patient > 75%     Toileting Toileting    Toileting assist Assist for toileting: Moderate Assistance - Patient 50 - 74%     Transfers Chair/bed transfer  Transfers assist  Chair/bed transfer activity did not occur: Safety/medical concerns  Chair/bed transfer assist level: Moderate Assistance - Patient 50 - 74%     Locomotion Ambulation   Ambulation assist      Assist level: Maximal Assistance - Patient 25 - 49% Assistive device: Hand held assist (+2) Max distance: 30 ft   Walk 10 feet activity   Assist     Assist level: Maximal Assistance - Patient 25 - 49% Assistive device: Hand held assist (+2)   Walk 50 feet  activity   Assist Walk 50 feet with 2 turns activity did not occur: Safety/medical concerns         Walk 150 feet activity   Assist Walk 150 feet activity did not occur: Safety/medical concerns         Walk 10 feet on uneven surface  activity   Assist Walk 10 feet on uneven surfaces activity did not occur: Safety/medical concerns         Wheelchair     Assist Is the patient using a wheelchair?: Yes Type of Wheelchair: Manual    Wheelchair assist level: Dependent - Patient 0% Max wheelchair distance: 250 ft    Wheelchair 50 feet with 2 turns activity    Assist        Assist Level: Dependent - Patient 0%   Wheelchair 150 feet activity     Assist      Assist Level: Dependent - Patient 0%   Blood pressure 126/79, pulse (!) 58, temperature 97.9 F (36.6 C), resp. rate 18, height 5' 6 (1.676 m), weight 62.9 kg, SpO2 100%.  Medical Problem List and Plan: 1. 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 Will order PRAFO to use at night  - Stable to continue inpatient rehab Team conference today please see physician documentation under team conference tab, met with team  to discuss problems,progress, and goals. Formulized individual treatment plan based on medical history, underlying problem and comorbidities.  2.  Antithrombotics: -DVT/anticoagulation:  Pharmaceutical: Lovenox  may d/c amb 100'             -antiplatelet therapy: DAPT X 3 weeks followed by ASA alone. Starting ASA alone 7/1 3. Pain Management: Tylenol  prn.  4. Mood/Behavior/Sleep: LCSW to follow for evaluation and support.              -antipsychotic agents:  N/A--agitation has resolved.  5. Neuropsych/cognition: This patient is capable of making decisions on his own behalf. 6. Skin/Wound Care: Routine pressure relief measures.  7. Fluids/Electrolytes/Nutrition: Monitor I/O. Check CMET in am.  8.  HTN: Monitor BP TID and slowly  normalize over next few week per nephrology             --on  Amlodipine  10, metoprolol  50 mg bid and hydralazine  100 mg every 8 hrs. Vitals:   03/12/24 0515 03/12/24 0609  BP: 126/79 126/79  Pulse: (!) 58   Resp: 18   Temp: 97.9 F (36.6 C)   SpO2: 100%    7/1 Controlled continue amlodipine  10 mg, hydralazine  100 mg, metoprolol  50 mg per nephrology.    9. Dysphagia: improved tolerating regular diet and thin liquids   10. 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  Sent studies for atypical HUS. BUN/Cr still elevated over baseline but no additional tx recommended other  than weekly check.  Will rec Nephro f/u as OP  BMP tomorrow  -6/28  Will discontinue bladder scans, appears to be stable      Latest Ref Rng & Units 03/09/2024    5:23 AM 03/03/2024    4:59 AM 03/02/2024    7:58 PM  BMP  Glucose 70 - 99 mg/dL 93  95  891   BUN 6 - 20 mg/dL 29  32  34   Creatinine 0.61 - 1.24 mg/dL 7.06  7.24  7.27   Sodium 135 - 145 mmol/L 137  137  139   Potassium 3.5 - 5.1 mmol/L 4.1  3.9  4.1   Chloride 98 - 111 mmol/L 103  105  106   CO2 22 - 32 mmol/L 27  28  25    Calcium  8.9 - 10.3 mg/dL 9.0  8.7  9.0      11. HIV disease: Continue BIKTARVY  50-200-25. Now followed by Dr. CHRISTELLA. Singh.  12. Anemia of chronic disease: Likely due to HIV/CKD. Will recheck in am.  Hgb stable  13. Thrombocytopenia: Platelets 51 at admission. Now up to 147.  --Monitor for recovery 14. Hypocalcemia: Ionized calcium  1.08. on supplement, corrected to 9.0 when factoring in low alb 15. Hyponatremia: mild/borderline, improved   16. Tobacco use: Nicotine  patch. Encourage nicotine  cessation.    LOS: 16 days A FACE TO FACE EVALUATION WAS PERFORMED  Prentice FORBES Compton 03/12/2024, 8:44 AM

## 2024-03-12 NOTE — Progress Notes (Signed)
 Occupational Therapy Session Note  Patient Details  Name: Juan Townsend MRN: [id number] Date of Birth: [birth year]  Today's Date: 03/12/2024 OT Individual Time: 1020-1100 OT Individual Time Calculation (min): 40 min    Short Term Goals: Week 3:  OT Short Term Goal 1 (Week 3): STG=LTG d/t ELOS  Skilled Therapeutic Interventions/Progress Updates:     Pt received sitting up in wc presenting to be in good spirits receptive to skilled OT session reporting 0/10 pain- OT offering intermittent rest breaks, repositioning, and therapeutic support to optimize participation in therapy session. Pt requesting shower, focused this session on BADL retraining with emphasis on recall of hemi-techniques, recall of transfer methods, and functional use of L UE. Transported Pt to shower total A in wc. Mod questioning cues +increase time required to recall set-up for squat pivot technique to shower seat with Pt then able to complete with CGA using grab bar. Pt doffed pants from waist in standing with CGA and utilized hemi-technique to doff shirt with SUP. U/LB bathing completed in sitting position for energy conservation and safety. Pt able to maintain sitting balance with SBA this session completing UB bathing with light min A required to ensure cleanliness when washing R UE and LB bathing min A to ensure cleanliness when washing buttocks in standing position. Utilize L UE at a gross assist level during shower to manage shower head, wash R UE, and to manage soap containers. Dried self seated with min A. Squat pivot shower seat > wc using grab bar CGA. U/LB dressing completed using hemi-techniques. Pt able to cross B LEs into figure four and donn socks with min A required to maintain this position +increased time. Weaved B LEs into feet with SBA and stood while R UE supported on sink to bring pants to wast with light min A provided for standing balance. Donned OH shirt with SUP +increased time and verbal cues for orientation.  Grooming/hygiene tasks completed in seated position with Pt able to brush teeth, and apply lotion with set-up A. Pt continuing to demonstrate improved functional use of his L UE. Would benefit from increased practice of squat pivot/stand pivot transfers to improve carryover of technique.  Pt was left resting in wc with call bell in reach, seatbelt alarm on, and all needs met.    Therapy Documentation Precautions:  Precautions Precautions: Fall Recall of Precautions/Restrictions: Impaired Precaution/Restrictions Comments: L hemipareisis, permissive HTN, HIV (+) Restrictions Weight Bearing Restrictions Per Provider Order: No  Therapy/Group: Individual Therapy  Katheryn SHAUNNA [place]ines 03/12/2024, 8:01 AM

## 2024-03-12 NOTE — Progress Notes (Addendum)
 Physical Therapy Session Note  Patient Details  Name: Juan Townsend MRN: [id number] Date of Birth: 08-30-1976  Today's Date: 03/11/2024 PT Individual Time: 1402-1430 PT Individual Time Calculation (min): 28 min  Short Term Goals: Week 2:  PT Short Term Goal 1 (Week 2): Pt will perform bed mobility from flat mattress with overall SBA. PT Short Term Goal 2 (Week 2): Pt will perform standing transfers with overall CGA/ MinA. PT Short Term Goal 3 (Week 2): Pt will perform squat pivot transfers with SBA. PT Short Term Goal 4 (Week 2): Pt will perform w/c mobility with supervision. PT Short Term Goal 5 (Week 2): pt will ambulate up to 200' with CGA using RW.  Skilled Therapeutic Interventions/Progress Updates:  Patient seated upright in w/c on entrance to room. Patient alert and agreeable to PT session.   Patient with no pain complaint at start of session.  Wheelchair Mobility:  Discussion with pt re: need for both w/c and RW on return home d/t pt's potential need for mobility during day while brother and SIL sleep d/t both working 3rd shift. Discussed need for w/c d/t ambulation improving but not 100% safe without light physical assist. Also related to pt that he still requires reminders for safe setup with improved efficiency prior to attempting transfers in order to complete without physical assist.   Provided pt with 16x16 w/c in the event that he will be able to have w/c at home. Pt demos improved ability to make contact with floor. Provided with verbal instruction and visual demonstration of proper technique. Initially requires downward pressure through R knee for improved contact and able to progress to no tc. Vc for shorter stride length for improved HS activation at slightly shorter length.   Improved ability to apply and release brakes with R and L hands. Improved movement and control with L hand but continues to require increased effort. With extended brake levers, pt would have  increased ease of brake lever movement. Also demos ability to problem solve application of L foot rest with R hand without direct observation from therapist. Praised for ability to figure out and execute!  Therapeutic Activity: Patient demos squat pivot transfer into new w/c with reduced vc and light CGA. At end of session pt performs stand pivot to bed with reach from RUE to bed rail and SBA.   Pt supine in bed at end of session with brakes locked, bed alarm set, and all needs within reach.   Therapy Documentation Precautions:  Precautions Precautions: Fall Recall of Precautions/Restrictions: Impaired Precaution/Restrictions Comments: L hemipareisis, permissive HTN, HIV (+) Restrictions Weight Bearing Restrictions Per Provider Order: No  Pain:  No pain related this session.   Therapy/Group: Individual Therapy  [name] PT, DPT, CSRS 03/11/2024, 7:41 AM

## 2024-03-12 NOTE — Progress Notes (Signed)
 Physical Therapy Session Note  Patient Details  Name: Juan Townsend MRN: [id number] Date of Birth: [birth year]  Today's Date: 03/12/2024 PT Individual Time: [phone]; 1135 - 1200 PT Individual Time Calculation (min): 42 min; 25 min   Short Term Goals: Week 2:  PT Short Term Goal 1 (Week 2): Pt will perform bed mobility from flat mattress with overall SBA. PT Short Term Goal 2 (Week 2): Pt will perform standing transfers with overall CGA/ MinA. PT Short Term Goal 3 (Week 2): Pt will perform squat pivot transfers with SBA. PT Short Term Goal 4 (Week 2): Pt will perform w/c mobility with supervision. PT Short Term Goal 5 (Week 2): pt will ambulate up to 200' with CGA using RW.  SESSION 1 Skilled Therapeutic Interventions/Progress Updates: Patient supine in bed on entrance to room. Patient alert and agreeable to PT session.   Patient reported no pain during session.   Therapeutic Activity: Bed Mobility: Pt performed supine<sit on EOB with supervision (HOB elevated) and VC for pt to use R UE to assist with pushing into bed to elevate trunk. Transfers: Pt performed SPT from EOB<WC at beginning of session with cues to perform without PTA giving instructions until needed for safety. Pt set up and scooted close to [facility] with hinted cue to remove arm rest and head/hip relationship. Pt transferred close supervision with education provided at end to check B LE placement as pt leading LE was underneath WC, which caused pt to half way sit on wheel.  Pt participated in transfers from WC<>edge of mat x 2 with hinted cues throughout for set up (scoot forward with hips towards transfer surface, and B LE placement with leading out towards transfer surface, and following LE underneath knee).   Gait Training:  Pt ambulated from day room<room end of session (roughly 130') using RW with CGA. Pt demonstrated the following gait deviations with therapist providing the described cuing and facilitation for improvement:   - Decreased step length B LE with cues to perform step-through vs step-to - Decreased heel strike L LE with cues to perform. Pt presented with ataxic movement when attempting to coordinate L heel strike placement - Decreased BOS with VC to increase throughout  Patient sitting in WC at end of session with brakes locked, belt alarm set, and all needs within reach.  SESSION 2 Skilled Therapeutic Interventions/Progress Updates: Patient sitting in WC on entrance to room. Patient alert and agreeable to PT session.   Patient reported no pain.  Therapeutic Activity: Transfers: Pt performed squat pivot transfers from WC<>EOB x 4 with instructions for pt to self set up without PTA assisting unless needing to for safety. Pt with some carryover from earlier session with recall of anterior scoot towards transfer surface, but required hinted cuing of B LE placement with leading LE out towards transfer surface, and following LE under knee. Pt also required VC for head/hip relationship. Pt performed transfers overall with close supervision.   Neuromuscular Re-ed: NMR facilitated during session with focus on neuromuscular connection/activation. - Pt performing scapular retraction with yellow theraband on R UE (after 2nd set - no resistance on 1st set) and PTA providing manual resistance on L UE while providing tactile feedback to scapular retractors. Pt reported really like this as it engages posterior chain musculature (pt reported that attending OT communicated to pt that pt would benefit from increasing strength in stated area to improve rounded shoulder presentation).   NMR performed for improvements in motor control and coordination, balance, sequencing, judgement,  and self confidence/ efficacy in performing all aspects of mobility at highest level of independence.   Patient sitting in WC at end of session with brakes locked, belt alarm set, and all needs within reach.       Therapy  Documentation Precautions:  Precautions Precautions: Fall Recall of Precautions/Restrictions: Impaired Precaution/Restrictions Comments: L hemipareisis, permissive HTN, HIV (+) Restrictions Weight Bearing Restrictions Per Provider Order: No   Therapy/Group: Individual Therapy  Amory Simonetti PTA 03/12/2024, 12:26 PM

## 2024-03-13 NOTE — Plan of Care (Signed)
  Problem: Consults Goal: RH STROKE PATIENT EDUCATION Description: See Patient Education module for education specifics  Outcome: Progressing   Problem: RH BOWEL ELIMINATION Goal: RH STG MANAGE BOWEL WITH ASSISTANCE Description: STG Manage Bowel with toileting Assistance. Outcome: Progressing Goal: RH STG MANAGE BOWEL W/MEDICATION W/ASSISTANCE Description: STG Manage Bowel with Medication with mod I  Assistance. Outcome: Progressing   Problem: RH SAFETY Goal: RH STG ADHERE TO SAFETY PRECAUTIONS W/ASSISTANCE/DEVICE Description: STG Adhere to Safety Precautions With  cues Assistance/Device. Outcome: Progressing

## 2024-03-13 NOTE — Progress Notes (Signed)
 Speech Language Pathology Daily Session Note  Patient Details  Name: Juan Townsend MRN: [id number] Date of Birth: March 29, 1976  Today's Date: 03/13/2024 SLP Individual Time: 1200-1230 SLP Individual Time Calculation (min): 30 min  Short Term Goals: Week 3: SLP Short Term Goal 1 (Week 3): STG = LTG due to ELOS  Skilled Therapeutic Interventions: Skilled therapy session focused on cognitive and dysphagia goals. SLP facilitated session by prompting patient to participate in completion of deductive puzzles. Patient required minA to utilize problem solving skills for accurate completion. SLP targeted dysphagia goals by observing patient with regular/thin liquid lunch tray. Patient with timely mastication and complete oral clearance. No s/sx of aspiration present. Patient utilized safe swallowing strategies with mod i A. Continue current diet and intermittent supervision. Patient left in [facility] with alarm set and call bell in reach. Continue POC  Pain Denies  Therapy/Group: Individual Therapy  Evian Salguero M.A., CCC-SLP 03/13/2024, 10:33 AM

## 2024-03-13 NOTE — Progress Notes (Signed)
 Occupational Therapy Session Note  Patient Details  Name: Juan Townsend MRN: [id number] Date of Birth: 1975-11-17  Today's Date: 03/13/2024 OT Individual Time: 1005-1100 OT Individual Time Calculation (min): 55 min   Short Term Goals: Week 3:  OT Short Term Goal 1 (Week 3): STG=LTG d/t ELOS  Skilled Therapeutic Interventions/Progress Updates:   Pt greeted from direct PT handout for skilled OT session with focus on LUE/LLE NMR through participation in dance group. OT requires assistance with terminal LUE flexion/abduction and wrist flexion/extension, alongside LLE dorsiflexion/plantar flexion, in order to follow leader during dance moves. Rest provided as needed but no reports of pain. Pt remained sitting in WC with 4Ps assessed and immediate needs met. Pt continues to be appropriate for skilled OT intervention to promote further functional independence in ADLs/IADLs.   Therapy Documentation Precautions:  Precautions Precautions: Fall Recall of Precautions/Restrictions: Impaired Precaution/Restrictions Comments: L hemipareisis, permissive HTN, HIV (+) Restrictions Weight Bearing Restrictions Per Provider Order: No   Therapy/Group: Individual Therapy  Nereida Habermann, OTR/L, MSOT  03/13/2024, 6:21 AM

## 2024-03-13 NOTE — Progress Notes (Signed)
 Physical Therapy Weekly Progress Note  Patient Details  Name: Juan Townsend MRN: [id number] Date of Birth: August 07, 1976  Beginning of progress report period: March 07, 2024 End of progress report period: March 13, 2024  {CHL IP REHAB PT TIME CALCULATION:304800500}  Patient has met {number 1-5:22450} of {number 1-5:20334} short term goals.  ***  Patient continues to demonstrate the following deficits {impairments:3041632} and therefore will continue to benefit from skilled PT intervention to increase functional independence with mobility.  Patient {LTG progression:3041653}.  {plan of rjmz:6958345}  PT Short Term Goals {DUH:6958314}  Skilled Therapeutic Interventions/Progress Updates:      Therapy Documentation Precautions:  Precautions Precautions: Fall Recall of Precautions/Restrictions: Impaired Precaution/Restrictions Comments: L hemipareisis, permissive HTN, HIV (+) Restrictions Weight Bearing Restrictions Per Provider Order: No General:   Vital Signs: Therapy Vitals Temp: 98.4 F (36.9 C) Temp Source: Oral Pulse Rate: (!) 58 Resp: 15 BP: 119/89 Patient Position (if appropriate): Lying Oxygen Therapy SpO2: 99 % O2 Device: Room Air Pain:   Vision/Perception     Mobility:   Locomotion :    Trunk/Postural Assessment :    Balance:   Exercises:   Other Treatments:     Therapy/Group: {Therapy/Group:3049007}  Juan Townsend 03/13/2024, 7:38 AM

## 2024-03-13 NOTE — Progress Notes (Signed)
 Patient ID: Juan Townsend, male   DOB: 1975-10-26, 48 y.o.   MRN: [id number] Have placed order for rolling walker, 3 in 1 and tub bench via [employer] in preparation of discharge next Wed.

## 2024-03-13 NOTE — Progress Notes (Signed)
 Occupational Therapy Session Note  Patient Details  Name: Juan Townsend MRN: [id number] Date of Birth: 1976-04-12  Today's Date: 03/13/2024 OT Individual Time: 1350-1430 OT Individual Time Calculation (min): 40 min    Short Term Goals: Week 3:  OT Short Term Goal 1 (Week 3): STG=LTG d/t ELOS  Skilled Therapeutic Interventions/Progress Updates:     Pt received sitting up in wc dressed for the day. Pt declining need for toileting or shower this session. Pt presenting to be in good spirits receptive to skilled OT session reporting 0/10 pain- OT offering intermittent rest breaks, repositioning, and therapeutic support to optimize participation in therapy session. Transported Pt total A to therapy gym in wc for time management and energy conservation. Pt able to set-up for squat pivot transfer with mod verbal cues and transfer to mat with CGA to R. Once sitting EOM, Pt noted to be soiled- Pt reporting he was aware of this and he knew he needed to use the restroom, however the urinal in his room was out of reach and he did not want to ask for help in the bathroom. Provided education on nursing staff being available to help with toileting at any time with Pt verbalizing understanding. Also educated on skin breakdown prevention and importance of keeping his skin clean and dry with Pt nodding in understanding. Pt completed squat pivot to L EOM > wc with CGA. Transported Pt back to room total A in wc. Stand pivot wc > toilet using grab bar CGA for balance- Pt with increase clonus in standing this session d/t fatigue. He maintained standing balance  during clothing management with CGA. While seated on toilet provided Pt with wash cloth and he was able to complete LB bathing with SUP. Donned clean pants with CGA for dynamic sitting balance +increased time and mod verbal cues to recall hemi technique. Stand pivot toilet > wc using grab bar CGA. Positioned Pt at sink where he was able to complete oral care using  hemi-technique with set-up A. Pt fatigued at end of session requesting to return to bed. Squat pivot wc > EOB CGA. EOB > supine SUP. Pt was left resting in bed with call bell in reach, bed alarm on, and all needs met.    Therapy Documentation Precautions:  Precautions Precautions: Fall Recall of Precautions/Restrictions: Impaired Precaution/Restrictions Comments: L hemipareisis, permissive HTN, HIV (+) Restrictions Weight Bearing Restrictions Per Provider Order: No  Therapy/Group: Individual Therapy  Katheryn SHAUNNA [place] 03/13/2024, 3:06 PM

## 2024-03-13 NOTE — Progress Notes (Signed)
 PROGRESS NOTE   Subjective/Complaints:  No issues overnite , discussed therapy progress, importance of practicing movement outside of therapy   ROS- neg Chills, CP, SOB, N/V/D, abdominal pain      03/13/2024    6:20 AM 03/13/2024    5:30 AM 03/13/2024    5:21 AM  Vitals with BMI  Weight  137 lbs 13 oz   BMI  22.25   Systolic 119  119  Diastolic 89  89  Pulse   58      Objective:   No results found.  No results for input(s): WBC, HGB, HCT, PLT in the last 72 hours.   No results for input(s): NA, K, CL, CO2, GLUCOSE, BUN, CREATININE, CALCIUM  in the last 72 hours.    Intake/Output Summary (Last 24 hours) at 03/13/2024 0820 Last data filed at 03/13/2024 0700 Gross per 24 hour  Intake 477 ml  Output 800 ml  Net -323 ml        Physical Exam: Vital Signs Blood pressure 119/89, pulse (!) 58, temperature 98.4 F (36.9 C), temperature source Oral, resp. rate 15, height 5' 6 (1.676 m), weight 62.5 kg, SpO2 99%.   General: No acute distress.  Sitting in wheelchair working with therapy Mood and affect are appropriate Heart: Regular rate and rhythm no rubs murmurs or extra sounds Lungs: Clear to auscultation, breathing unlabored, no rales or wheezes Abdomen: Positive bowel sounds, soft nontender to palpation, nondistended Extremities: No clubbing, cyanosis, or edema Skin: No evidence of breakdown, no evidence of rash  Neurologic: Awake, alert, oriented x 4.  Usually answers in 1 or 2 word answers Cranial nerves II through XII intact,  motor strength is 5/5 on the right 3- left delt, bi, tri grip, 3- knee ext and hip flexion  trace/5 at foot and ankle- FIne motor LUE reduced but able to do pincer grasp, weakly , also able to oppose thumb to middle finger Sensory exam normal sensation to light touch and proprioception in bilateral upper and reduced LT lower extremities  No pain or with knee ROM,  no pain to palp over the hamstrings Musculoskeletal: Full range of motion in all 4 extremities. No joint swelling  Physical exam unchanged from the above on reexamination 03/13/24    Assessment/Plan: 1. Functional deficits which require 3+ hours per day of interdisciplinary therapy in a comprehensive inpatient rehab setting. Physiatrist is providing close team supervision and 24 hour management of active medical problems listed below. Physiatrist and rehab team continue to assess barriers to discharge/monitor patient progress toward functional and medical goals  Care Tool:  Bathing    Body parts bathed by patient: Left arm, Chest, Abdomen, Front perineal area, Right upper leg, Left upper leg, Face, Left lower leg, Right lower leg, Right arm, Buttocks   Body parts bathed by helper: Right arm, Buttocks     Bathing assist Assist Level: Contact Guard/Touching assist     Upper Body Dressing/Undressing Upper body dressing   What is the patient wearing?: Pull over shirt    Upper body assist Assist Level: Supervision/Verbal cueing    Lower Body Dressing/Undressing Lower body dressing      What is the patient  wearing?: Incontinence brief, Pants     Lower body assist Assist for lower body dressing: Minimal Assistance - Patient > 75%     Toileting Toileting    Toileting assist Assist for toileting: Moderate Assistance - Patient 50 - 74%     Transfers Chair/bed transfer  Transfers assist  Chair/bed transfer activity did not occur: Safety/medical concerns  Chair/bed transfer assist level: Moderate Assistance - Patient 50 - 74%     Locomotion Ambulation   Ambulation assist      Assist level: Maximal Assistance - Patient 25 - 49% Assistive device: Hand held assist (+2) Max distance: 30 ft   Walk 10 feet activity   Assist     Assist level: Maximal Assistance - Patient 25 - 49% Assistive device: Hand held assist (+2)   Walk 50 feet activity   Assist Walk 50  feet with 2 turns activity did not occur: Safety/medical concerns         Walk 150 feet activity   Assist Walk 150 feet activity did not occur: Safety/medical concerns         Walk 10 feet on uneven surface  activity   Assist Walk 10 feet on uneven surfaces activity did not occur: Safety/medical concerns         Wheelchair     Assist Is the patient using a wheelchair?: Yes Type of Wheelchair: Manual    Wheelchair assist level: Dependent - Patient 0% Max wheelchair distance: 250 ft    Wheelchair 50 feet with 2 turns activity    Assist        Assist Level: Dependent - Patient 0%   Wheelchair 150 feet activity     Assist      Assist Level: Dependent - Patient 0%   Blood pressure 119/89, pulse (!) 58, temperature 98.4 F (36.9 C), temperature source Oral, resp. rate 15, height 5' 6 (1.676 m), weight 62.5 kg, SpO2 99%.  Medical Problem List and Plan: 1. 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 Will order PRAFO to use at night  - Stable to continue inpatient rehab   2.  Antithrombotics: -DVT/anticoagulation:  Pharmaceutical: Lovenox  may d/c amb 100'             -antiplatelet therapy: DAPT X 3 weeks followed by ASA alone. Starting ASA alone 7/1 3. Pain Management: Tylenol  prn.  4. Mood/Behavior/Sleep: LCSW to follow for evaluation and support.              -antipsychotic agents: N/A--agitation has resolved.  5. Neuropsych/cognition: This patient is capable of making decisions on his own behalf. 6. Skin/Wound Care: Routine pressure relief measures.  7. Fluids/Electrolytes/Nutrition: Monitor I/O. Check CMET in am.  8.  HTN:  Monitor BP TID and slowly  normalize over next few week per nephrology             --on Amlodipine  10, metoprolol  50 mg bid and hydralazine  100 mg every 8 hrs. Vitals:   03/13/24 0521 03/13/24 0620  BP: 119/89 119/89  Pulse: (!) 58   Resp: 15  Temp: 98.4 F (36.9 C)   SpO2: 99%    7/1 Controlled continue amlodipine  10 mg, hydralazine  100 mg, metoprolol  50 mg per nephrology.    9. Dysphagia: improved tolerating regular diet and thin liquids   10. 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  Sent studies for atypical HUS. Creat now running in 2.5-3.0 range but has been stable , will have OP f/u with Nephro BMP tomorrow  -6/28  Will discontinue bladder scans, appears to be stable      Latest Ref Rng & Units 03/09/2024    5:23 AM 03/03/2024    4:59 AM 03/02/2024    7:58 PM  BMP  Glucose 70 - 99 mg/dL 93  95  891   BUN 6 - 20 mg/dL 29  32  34   Creatinine 0.61 - 1.24 mg/dL 7.06  7.24  7.27   Sodium 135 - 145 mmol/L 137  137  139   Potassium 3.5 - 5.1 mmol/L 4.1  3.9  4.1   Chloride 98 - 111 mmol/L 103  105  106   CO2 22 - 32 mmol/L 27  28  25    Calcium  8.9 - 10.3 mg/dL 9.0  8.7  9.0      11. HIV disease: Continue BIKTARVY  50-200-25. Now followed by Dr. CHRISTELLA. Singh.  12. Anemia of chronic disease: Likely due to HIV/CKD. Will recheck in am.  Hgb stable  13. Thrombocytopenia: Platelets 51 at admission. Now up to 147.  --Monitor for recovery 14. Hypocalcemia: Ionized calcium  1.08. on supplement, corrected to 9.0 when factoring in low alb 15. Hyponatremia: mild/borderline, improved   16. Tobacco use: Nicotine  patch. Encourage nicotine  cessation.   LOS: 17 days A FACE TO FACE EVALUATION WAS PERFORMED  Prentice FORBES Compton 03/13/2024, 8:20 AM

## 2024-03-14 NOTE — Progress Notes (Signed)
 Speech Language Pathology Daily Session Note  Patient Details  Name: Juan Townsend MRN: [id number] Date of Birth: June 09, 1976  Today's Date: 03/14/2024 SLP Individual Time: 1230-1315 SLP Individual Time Calculation (min): 45 min  Short Term Goals: Week 3: SLP Short Term Goal 1 (Week 3): STG = LTG due to ELOS  Skilled Therapeutic Interventions: Skilled therapy session focused on cognitive goals. SLP facilitated session by prompting patient to utilize problem solving skills to complete deductive reasoning puzzle. Patient required min fading to supervisionA to complete this activity. Patient left in bed with alarm set and call bell in reach. Continue POC.  Pain None reported   Therapy/Group: Individual Therapy  Belinda Bringhurst M.A., CCC-SLP 03/14/2024, 7:41 AM

## 2024-03-14 NOTE — Progress Notes (Signed)
 Physical Therapy Session Note  Patient Details  Name: Juan Townsend MRN: [id number] Date of Birth: May 26, 1976  Today's Date: 03/14/2024 PT Individual Time: 1012-1058 PT Individual Time Calculation (min): 46 min   Short Term Goals: Week 2:  PT Short Term Goal 1 (Week 2): Pt will perform bed mobility from flat mattress with overall SBA. PT Short Term Goal 2 (Week 2): Pt will perform standing transfers with overall CGA/ MinA. PT Short Term Goal 3 (Week 2): Pt will perform squat pivot transfers with SBA. PT Short Term Goal 4 (Week 2): Pt will perform w/c mobility with supervision. PT Short Term Goal 5 (Week 2): pt will ambulate up to 200' with CGA using RW. Week 3:      Skilled Therapeutic Interventions/Progress Updates:      Therapy Documentation Precautions:  Precautions Precautions: Fall Recall of Precautions/Restrictions: Impaired Precaution/Restrictions Comments: L hemipareisis, permissive HTN, HIV (+) Restrictions Weight Bearing Restrictions Per Provider Order: No  Pain:     Therapy/Group: Individual Therapy  [name] PT, DPT, CSRS 03/14/2024, 3:42 PM

## 2024-03-14 NOTE — Progress Notes (Signed)
 Uneventful night. Uses urinal. Left resting hand splint and left PRAFO boot applied at HS. Patient wears part of night. Denies pain. Juan Townsend A

## 2024-03-14 NOTE — Progress Notes (Signed)
 Incontinent of urine this morning, requiring total linen change and hygiene. Poor initiation.Elbie Statzer A

## 2024-03-14 NOTE — Progress Notes (Signed)
 PROGRESS NOTE   Subjective/Complaints: No new complaints this morning Denies pain Tolerated therapy well today, notes reviewed +urinary incontinence  ROS- neg Chills, CP, SOB, N/V/D, abdominal pain      03/14/2024    5:58 AM 03/14/2024    5:00 AM 03/13/2024    8:31 PM  Vitals with BMI  Weight  136 lbs 14 oz   BMI  22.11   Systolic 151  129  Diastolic 95  87  Pulse 65  62      Objective:   No results found.  No results for input(s): WBC, HGB, HCT, PLT in the last 72 hours.   No results for input(s): NA, K, CL, CO2, GLUCOSE, BUN, CREATININE, CALCIUM  in the last 72 hours.    Intake/Output Summary (Last 24 hours) at 03/14/2024 1615 Last data filed at 03/14/2024 1422 Gross per 24 hour  Intake 477 ml  Output 1050 ml  Net -573 ml        Physical Exam: Vital Signs Blood pressure (!) 151/95, pulse 65, temperature 98.8 F (37.1 C), resp. rate 14, height 5' 6 (1.676 m), weight 62.1 kg, SpO2 100%.   General: No acute distress.  Sitting in wheelchair working with therapy Mood and affect are appropriate Heart: Regular rate and rhythm no rubs murmurs or extra sounds Lungs: Clear to auscultation, breathing unlabored, no rales or wheezes Abdomen: Positive bowel sounds, soft nontender to palpation, nondistended Extremities: No clubbing, cyanosis, or edema Skin: No evidence of breakdown, no evidence of rash  Neurologic: Awake, alert, oriented x 4.  Usually answers in 1 or 2 word answers Cranial nerves II through XII intact,  motor strength is 5/5 on the right 3- left delt, bi, tri grip, 3- knee ext and hip flexion  trace/5 at foot and ankle- FIne motor LUE reduced but able to do pincer grasp, weakly , also able to oppose thumb to middle finger Sensory exam normal sensation to light touch and proprioception in bilateral upper and reduced LT lower extremities, stable 7/5  No pain or with knee ROM,  no pain to palp over the hamstrings Musculoskeletal: Full range of motion in all 4 extremities. No joint swelling  Physical exam unchanged from the above on reexamination 03/14/24    Assessment/Plan: 1. Functional deficits which require 3+ hours per day of interdisciplinary therapy in a comprehensive inpatient rehab setting. Physiatrist is providing close team supervision and 24 hour management of active medical problems listed below. Physiatrist and rehab team continue to assess barriers to discharge/monitor patient progress toward functional and medical goals  Care Tool:  Bathing    Body parts bathed by patient: Left arm, Chest, Abdomen, Front perineal area, Right upper leg, Left upper leg, Face, Left lower leg, Right lower leg, Right arm, Buttocks   Body parts bathed by helper: Right arm, Buttocks     Bathing assist Assist Level: Contact Guard/Touching assist     Upper Body Dressing/Undressing Upper body dressing   What is the patient wearing?: Pull over shirt    Upper body assist Assist Level: Supervision/Verbal cueing    Lower Body Dressing/Undressing Lower body dressing      What is the patient wearing?: Incontinence  brief, Pants     Lower body assist Assist for lower body dressing: Minimal Assistance - Patient > 75%     Toileting Toileting    Toileting assist Assist for toileting: Moderate Assistance - Patient 50 - 74%     Transfers Chair/bed transfer  Transfers assist  Chair/bed transfer activity did not occur: Safety/medical concerns  Chair/bed transfer assist level: Moderate Assistance - Patient 50 - 74%     Locomotion Ambulation   Ambulation assist      Assist level: Maximal Assistance - Patient 25 - 49% Assistive device: Hand held assist (+2) Max distance: 30 ft   Walk 10 feet activity   Assist     Assist level: Maximal Assistance - Patient 25 - 49% Assistive device: Hand held assist (+2)   Walk 50 feet activity   Assist Walk 50  feet with 2 turns activity did not occur: Safety/medical concerns         Walk 150 feet activity   Assist Walk 150 feet activity did not occur: Safety/medical concerns         Walk 10 feet on uneven surface  activity   Assist Walk 10 feet on uneven surfaces activity did not occur: Safety/medical concerns         Wheelchair     Assist Is the patient using a wheelchair?: Yes Type of Wheelchair: Manual    Wheelchair assist level: Dependent - Patient 0% Max wheelchair distance: 250 ft    Wheelchair 50 feet with 2 turns activity    Assist        Assist Level: Dependent - Patient 0%   Wheelchair 150 feet activity     Assist      Assist Level: Dependent - Patient 0%   Blood pressure (!) 151/95, pulse 65, temperature 98.8 F (37.1 C), resp. rate 14, height 5' 6 (1.676 m), weight 62.1 kg, SpO2 100%.  Medical Problem List and Plan: 1. 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 Will order PRAFO to use at night  - Stable to continue inpatient rehab  Continue CIR  2.  Antithrombotics: -DVT/anticoagulation:  Pharmaceutical: Lovenox  may d/c amb 100'             -antiplatelet therapy: DAPT X 3 weeks followed by ASA alone. Starting ASA alone 7/1 3. Pain Management: continue Tylenol  prn.  4. Mood/Behavior/Sleep: LCSW to follow for evaluation and support.              -antipsychotic agents: N/A--agitation has resolved.  5. Neuropsych/cognition: This patient is capable of making decisions on his own behalf. 6. Skin/Wound Care: Routine pressure relief measures.  7. Fluids/Electrolytes/Nutrition: Monitor I/O. Check CMET in am.  8.  HTN:  Monitor BP TID and slowly  normalize over next few week per nephrology             --on Amlodipine  10, metoprolol  50 mg bid and hydralazine  100 mg every 8 hrs. Vitals:   03/13/24 2031 03/14/24 0558  BP: 129/87 (!) 151/95  Pulse: 62 65  Resp: 14 14  Temp: 98.6 F (37 C) 98.8 F (37.1 C)  SpO2: 100% 100%   continue amlodipine  10 mg, hydralazine  100 mg, metoprolol  50 mg per nephrology.    9. Dysphagia: improved tolerating regular diet and thin liquids   10. 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  Sent studies for atypical HUS. Creat now running in 2.5-3.0 range but has been stable , will have OP f/u with Nephro  -6/28  Will discontinue bladder scans, appears to be stable      Latest Ref Rng & Units 03/09/2024    5:23 AM 03/03/2024    4:59 AM 03/02/2024    7:58 PM  BMP  Glucose 70 - 99 mg/dL 93  95  891   BUN 6 - 20 mg/dL 29  32  34   Creatinine 0.61 - 1.24 mg/dL 7.06  7.24  7.27   Sodium 135 - 145 mmol/L 137  137  139   Potassium 3.5 - 5.1 mmol/L 4.1  3.9  4.1   Chloride 98 - 111 mmol/L 103  105  106   CO2 22 - 32 mmol/L 27  28  25    Calcium  8.9 - 10.3 mg/dL 9.0  8.7  9.0      11. HIV disease: continue BIKTARVY  50-200-25. Now followed by Dr. CHRISTELLA. Singh.   12. Anemia of chronic disease: Likely due to HIV/CKD. Will recheck in am.  Hgb stable   13. Thrombocytopenia: Platelets 51 at admission. Now up to 147.  --Monitor for recovery  14. Hypocalcemia: Ionized calcium  1.08. on supplement, corrected to 9.0 when factoring in low alb  15. Hyponatremia: mild/borderline, improved   16. Tobacco use: Nicotine  patch. Encourage nicotine  cessation.   LOS: 18 days A FACE TO FACE EVALUATION WAS PERFORMED  Sven SQUIBB Tenita Cue 03/14/2024, 4:15 PM

## 2024-03-15 NOTE — Progress Notes (Signed)
 PROGRESS NOTE   Subjective/Complaints: No new complaints this morning Appreciate nursing note, incontinent overnight, will order timed voiding, patient was trying to use urinal yesterday  ROS- denies Chills, CP, SOB, N/V/D, abdominal pain, pain      03/15/2024    7:51 AM 03/15/2024    5:10 AM 03/15/2024    5:08 AM  Vitals with BMI  Weight  135 lbs 2 oz   BMI  21.82   Systolic 119  117  Diastolic 71  79  Pulse 64  62      Objective:   No results found.  No results for input(s): WBC, HGB, HCT, PLT in the last 72 hours.   No results for input(s): NA, K, CL, CO2, GLUCOSE, BUN, CREATININE, CALCIUM  in the last 72 hours.    Intake/Output Summary (Last 24 hours) at 03/15/2024 1130 Last data filed at 03/15/2024 1100 Gross per 24 hour  Intake 1430 ml  Output 1740 ml  Net -310 ml        Physical Exam: Vital Signs Blood pressure 119/71, pulse 64, temperature 98.4 F (36.9 C), temperature source Oral, resp. rate 18, height 5' 6 (1.676 m), weight 61.3 kg, SpO2 100%.   General: No acute distress.  Sitting in wheelchair working with therapy Mood and affect are appropriate Heart: Regular rate and rhythm no rubs murmurs or extra sounds Lungs: Clear to auscultation, breathing unlabored, no rales or wheezes Abdomen: Positive bowel sounds, soft nontender to palpation, nondistended Extremities: No clubbing, cyanosis, or edema Skin: No evidence of breakdown, no evidence of rash  Neurologic: Awake, alert, oriented x 4.  Usually answers in 1 or 2 word answers Cranial nerves II through XII intact,  motor strength is 5/5 on the right 3- left delt, bi, tri grip, 3- knee ext and hip flexion  trace/5 at foot and ankle- FIne motor LUE reduced but able to do pincer grasp, weakly , also able to oppose thumb to middle finger Sensory exam normal sensation to light touch and proprioception in bilateral upper and  reduced LT lower extremities, stable 7/6  No pain or with knee ROM, no pain to palp over the hamstrings Musculoskeletal: Full range of motion in all 4 extremities. No joint swelling  Physical exam unchanged from the above on reexamination 03/15/24    GU: urinary incontinence  Assessment/Plan: 1. Functional deficits which require 3+ hours per day of interdisciplinary therapy in a comprehensive inpatient rehab setting. Physiatrist is providing close team supervision and 24 hour management of active medical problems listed below. Physiatrist and rehab team continue to assess barriers to discharge/monitor patient progress toward functional and medical goals  Care Tool:  Bathing    Body parts bathed by patient: Left arm, Chest, Abdomen, Front perineal area, Right upper leg, Left upper leg, Face, Left lower leg, Right lower leg, Right arm, Buttocks   Body parts bathed by helper: Right arm, Buttocks     Bathing assist Assist Level: Contact Guard/Touching assist     Upper Body Dressing/Undressing Upper body dressing   What is the patient wearing?: Pull over shirt    Upper body assist Assist Level: Supervision/Verbal cueing    Lower Body Dressing/Undressing Lower  body dressing      What is the patient wearing?: Incontinence brief, Pants     Lower body assist Assist for lower body dressing: Minimal Assistance - Patient > 75%     Toileting Toileting    Toileting assist Assist for toileting: Moderate Assistance - Patient 50 - 74%     Transfers Chair/bed transfer  Transfers assist  Chair/bed transfer activity did not occur: Safety/medical concerns  Chair/bed transfer assist level: Moderate Assistance - Patient 50 - 74%     Locomotion Ambulation   Ambulation assist      Assist level: Maximal Assistance - Patient 25 - 49% Assistive device: Hand held assist (+2) Max distance: 30 ft   Walk 10 feet activity   Assist     Assist level: Maximal Assistance -  Patient 25 - 49% Assistive device: Hand held assist (+2)   Walk 50 feet activity   Assist Walk 50 feet with 2 turns activity did not occur: Safety/medical concerns         Walk 150 feet activity   Assist Walk 150 feet activity did not occur: Safety/medical concerns         Walk 10 feet on uneven surface  activity   Assist Walk 10 feet on uneven surfaces activity did not occur: Safety/medical concerns         Wheelchair     Assist Is the patient using a wheelchair?: Yes Type of Wheelchair: Manual    Wheelchair assist level: Dependent - Patient 0% Max wheelchair distance: 250 ft    Wheelchair 50 feet with 2 turns activity    Assist        Assist Level: Dependent - Patient 0%   Wheelchair 150 feet activity     Assist      Assist Level: Dependent - Patient 0%   Blood pressure 119/71, pulse 64, temperature 98.4 F (36.9 C), temperature source Oral, resp. rate 18, height 5' 6 (1.676 m), weight 61.3 kg, SpO2 100%.  Medical Problem List and Plan: 1. 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 Will order PRAFO to use at night  - Stable to continue inpatient rehab  Continue CIR  2.  Antithrombotics: -DVT/anticoagulation:  Pharmaceutical: Lovenox  may d/c amb 100'             -antiplatelet therapy: DAPT X 3 weeks followed by ASA alone. Starting ASA alone 7/1 3. Pain Management: continue Tylenol  prn.  4. Mood/Behavior/Sleep: LCSW to follow for evaluation and support.              -antipsychotic agents: N/A--agitation has resolved.  5. Neuropsych/cognition: This patient is capable of making decisions on his own behalf. 6. Skin/Wound  Care: Routine pressure relief measures.  7. Urinary incontinence: timed voiding q2H while awake ordered   8.  HTN: Monitor BP TID and slowly  normalize over next few week per nephrology             --continue Amlodipine  10, metoprolol  50 mg bid and hydralazine  100 mg every 8 hrs. Vitals:   03/15/24  0508 03/15/24 0751  BP: 117/79 119/71  Pulse: 62 64  Resp: 18   Temp: 98.4 F (36.9 C)   SpO2: 100%    continue amlodipine  10 mg, hydralazine  100 mg, metoprolol  50 mg per nephrology.    9. Dysphagia: improved tolerating regular diet and thin liquids   10. 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  Sent studies for atypical HUS. Creat now running in 2.5-3.0 range but has been stable , will have OP f/u with Nephro  -6/28  Will discontinue bladder scans, appears to be stable      Latest Ref Rng & Units 03/09/2024    5:23 AM 03/03/2024    4:59 AM 03/02/2024    7:58 PM  BMP  Glucose 70 - 99 mg/dL 93  95  891   BUN 6 - 20 mg/dL 29  32  34   Creatinine 0.61 - 1.24 mg/dL 7.06  7.24  7.27   Sodium 135 - 145 mmol/L 137  137  139   Potassium 3.5 - 5.1 mmol/L 4.1  3.9  4.1   Chloride 98 - 111 mmol/L 103  105  106   CO2 22 - 32 mmol/L 27  28  25    Calcium  8.9 - 10.3 mg/dL 9.0  8.7  9.0      11. HIV disease: continue BIKTARVY  50-200-25. Now followed by Dr. CHRISTELLA. Singh.   12. Anemia of chronic disease: Likely due to HIV/CKD. Will recheck in am.  Hgb stable   13. Thrombocytopenia: Platelets 51 at admission. Now up to 147.  --Monitor for recovery  14. Hypocalcemia: Ionized calcium  1.08. on supplement, corrected to 9.0 when factoring in low alb  15. Hyponatremia: mild/borderline, improved   16. Tobacco use: Nicotine  patch. Encourage nicotine  cessation.   LOS: 19 days A FACE TO FACE EVALUATION WAS PERFORMED  Venicia Vandall P Amandajo Gonder 03/15/2024, 11:30 AM

## 2024-03-15 NOTE — Progress Notes (Signed)
 Incontinent of urine at start of shift, requiring complete bed change. Slept good tonight after scheduled trazodone  given. LBM 07/02, will offer PRN miralax  in AM. Left PRAFO boot and left WHO applied at HS. Denies pain.Juan Townsend A

## 2024-03-16 ENCOUNTER — Other Ambulatory Visit (HOSPITAL_COMMUNITY): Payer: Self-pay

## 2024-03-16 LAB — BASIC METABOLIC PANEL WITH GFR
Anion gap: 10 (ref 5–15)
BUN: 27 mg/dL — ABNORMAL HIGH (ref 6–20)
CO2: 24 mmol/L (ref 22–32)
Calcium: 8.9 mg/dL (ref 8.9–10.3)
Chloride: 101 mmol/L (ref 98–111)
Creatinine, Ser: 2.7 mg/dL — ABNORMAL HIGH (ref 0.61–1.24)
GFR, Estimated: 29 mL/min — ABNORMAL LOW (ref >60)
Glucose, Bld: 95 mg/dL (ref 70–99)
Potassium: 3.9 mmol/L (ref 3.5–5.1)
Sodium: 135 mmol/L (ref 135–145)

## 2024-03-16 NOTE — Progress Notes (Signed)
 PROGRESS NOTE   Subjective/Complaints:  Patient without new complaints today.  He is noticing his improvement.  His brother is supposed to come in today but he is not sure he is going to make it.  ROS- denies Chills, CP, SOB, N/V/D, abdominal pain, pain      03/16/2024    4:14 AM 03/15/2024    8:54 PM 03/15/2024    5:07 PM  Vitals with BMI  Weight 140 lbs 7 oz  134 lbs 8 oz  BMI 22.68  21.72  Systolic 127 130   Diastolic 84 85   Pulse 66 66       Objective:   No results found.  No results for input(s): WBC, HGB, HCT, PLT in the last 72 hours.   Recent Labs    03/16/24 0516  NA 135  K 3.9  CL 101  CO2 24  GLUCOSE 95  BUN 27*  CREATININE 2.70*  CALCIUM  8.9      Intake/Output Summary (Last 24 hours) at 03/16/2024 1002 Last data filed at 03/16/2024 0801 Gross per 24 hour  Intake 836 ml  Output 1500 ml  Net -664 ml        Physical Exam: Vital Signs Blood pressure 127/84, pulse 66, temperature 98.2 F (36.8 C), temperature source Oral, resp. rate 18, height 5' 6 (1.676 m), weight 63.7 kg, SpO2 100%.   General: No acute distress.  Sitting in wheelchair working with therapy Mood and affect are appropriate Heart: Regular rate and rhythm no rubs murmurs or extra sounds Lungs: Clear to auscultation, breathing unlabored, no rales or wheezes Abdomen: Positive bowel sounds, soft nontender to palpation, nondistended Extremities: No clubbing, cyanosis, or edema Skin: No evidence of breakdown, no evidence of rash  Neurologic: Awake, alert, oriented x 4.  Usually answers in 1 or 2 word answers Cranial nerves II through XII intact,  motor strength is 5/5 on the right 3 left delt, bi, tri grip, 3 knee ext and hip flexion  trace/5 at foot and ankle- FIne motor LUE reduced but able to do pincer grasp, weakly , also able to oppose thumb to middle finger Sensory exam normal sensation to light touch and  proprioception in bilateral upper and reduced LT lower extremities, stable 7/6  No pain or with knee ROM, no pain to palp over the hamstrings Musculoskeletal: Full range of motion in all 4 extremities. No joint swelling  Physical exam unchanged from the above on reexamination 03/16/24    GU: urinary incontinence  Assessment/Plan: 1. Functional deficits which require 3+ hours per day of interdisciplinary therapy in a comprehensive inpatient rehab setting. Physiatrist is providing close team supervision and 24 hour management of active medical problems listed below. Physiatrist and rehab team continue to assess barriers to discharge/monitor patient progress toward functional and medical goals  Care Tool:  Bathing    Body parts bathed by patient: Left arm, Chest, Abdomen, Front perineal area, Right upper leg, Left upper leg, Face, Left lower leg, Right lower leg, Right arm, Buttocks   Body parts bathed by helper: Right arm, Buttocks     Bathing assist Assist Level: Contact Guard/Touching assist     Upper Body Dressing/Undressing  Upper body dressing   What is the patient wearing?: Pull over shirt    Upper body assist Assist Level: Supervision/Verbal cueing    Lower Body Dressing/Undressing Lower body dressing      What is the patient wearing?: Incontinence brief, Pants     Lower body assist Assist for lower body dressing: Minimal Assistance - Patient > 75%     Toileting Toileting    Toileting assist Assist for toileting: Moderate Assistance - Patient 50 - 74%     Transfers Chair/bed transfer  Transfers assist  Chair/bed transfer activity did not occur: Safety/medical concerns  Chair/bed transfer assist level: Moderate Assistance - Patient 50 - 74%     Locomotion Ambulation   Ambulation assist      Assist level: Maximal Assistance - Patient 25 - 49% Assistive device: Hand held assist (+2) Max distance: 30 ft   Walk 10 feet activity   Assist      Assist level: Maximal Assistance - Patient 25 - 49% Assistive device: Hand held assist (+2)   Walk 50 feet activity   Assist Walk 50 feet with 2 turns activity did not occur: Safety/medical concerns         Walk 150 feet activity   Assist Walk 150 feet activity did not occur: Safety/medical concerns         Walk 10 feet on uneven surface  activity   Assist Walk 10 feet on uneven surfaces activity did not occur: Safety/medical concerns         Wheelchair     Assist Is the patient using a wheelchair?: Yes Type of Wheelchair: Manual    Wheelchair assist level: Dependent - Patient 0% Max wheelchair distance: 250 ft    Wheelchair 50 feet with 2 turns activity    Assist        Assist Level: Dependent - Patient 0%   Wheelchair 150 feet activity     Assist      Assist Level: Dependent - Patient 0%   Blood pressure 127/84, pulse 66, temperature 98.2 F (36.8 C), temperature source Oral, resp. rate 18, height 5' 6 (1.676 m), weight 63.7 kg, SpO2 100%.  Medical Problem List and Plan: 1. 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 Will order PRAFO to use at night  - Stable to continue inpatient rehab  Continue CIR  2.  Antithrombotics: -DVT/anticoagulation:  Pharmaceutical: Lovenox  may d/c amb 100'             -antiplatelet therapy: DAPT X 3 weeks followed by ASA alone. Starting ASA alone 7/1 3. Pain Management: continue Tylenol  prn.  4. Mood/Behavior/Sleep: LCSW to follow for evaluation and support.              -antipsychotic agents: N/A--agitation has resolved.  5. Neuropsych/cognition: This patient is capable of making  decisions on his own behalf. 6. Skin/Wound Care: Routine pressure relief measures.  7. Urinary incontinence: timed voiding q2H while awake ordered   8.  HTN: Monitor BP TID and slowly  normalize over next few week per nephrology             --  continue Amlodipine  10, metoprolol  50 mg bid and hydralazine  100 mg every 8 hrs. Vitals:   03/15/24 2054 03/16/24 0414  BP: 130/85 127/84  Pulse: 66 66  Resp: 18 18  Temp: 98.9 F (37.2 C) 98.2 F (36.8 C)  SpO2: 100% 100%   continue amlodipine  10 mg, hydralazine  100 mg, metoprolol  50 mg per nephrology.    9. Dysphagia: improved tolerating regular diet and thin liquids   10. 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  Sent studies for atypical HUS. Creat now running in 2.5-3.0 range but has been stable , will have OP f/u with Nephro  -6/28  Will discontinue bladder scans, appears to be stable      Latest Ref Rng & Units 03/16/2024    5:16 AM 03/09/2024    5:23 AM 03/03/2024    4:59 AM  BMP  Glucose 70 - 99 mg/dL 95  93  95   BUN 6 - 20 mg/dL 27  29  32   Creatinine 0.61 - 1.24 mg/dL 7.29  7.06  7.24   Sodium 135 - 145 mmol/L 135  137  137   Potassium 3.5 - 5.1 mmol/L 3.9  4.1  3.9   Chloride 98 - 111 mmol/L 101  103  105   CO2 22 - 32 mmol/L 24  27  28    Calcium  8.9 - 10.3 mg/dL 8.9  9.0  8.7   Renal function is stable  11. HIV disease: continue BIKTARVY  50-200-25. Now followed by Dr. CHRISTELLA. Singh.   12. Anemia of chronic disease: Likely due to HIV/CKD. Will recheck in am.  Hgb stable   13. Thrombocytopenia: Platelets 51 at admission. Now up to 147.  --Monitor for recovery  14. Hypocalcemia: Ionized calcium  1.08. on supplement, corrected to 9.0 when factoring in low alb  15. Hyponatremia: mild/borderline, improved   16. Tobacco use: Nicotine  patch. Encourage nicotine  cessation.   LOS: 20 days A FACE TO FACE EVALUATION WAS PERFORMED  Prentice FORBES Compton 03/16/2024, 10:02 AM

## 2024-03-16 NOTE — Progress Notes (Signed)
 Occupational Therapy Session Note  Patient Details  Name: Juan Townsend MRN: [id number] Date of Birth: [birth year]  Today's Date: 03/16/2024 OT Individual Time: 8997-8955 OT Individual Time Calculation (min): 42 min  Session 2 OT Individual Time: 8697-8654 OT Individual Time Calculation (min): 43 min   Short Term Goals: Week 3:  OT Short Term Goal 1 (Week 3): STG=LTG d/t ELOS  Skilled Therapeutic Interventions/Progress Updates:     AM Session: Pt received semi-reclined in bed presenting with flat affect receptive to skilled OT session reporting 0/10 pain- OT offering intermittent rest breaks, repositioning, and therapeutic support to optimize participation in therapy session. Family education scheduled for this session, however family not present to participate. Informed SW- SW reaching out to family to reinforce need for family education. Pt requesting to take shower- focused this session on ADL retraining and functional transfer training to increase overall independence and safety at d/c and decrease bourdon of care. Pt transitioned to EOB mod I with HOB elevated. Mod questioning cues required for Pt to recall head/hip relationship for squat pivot transfer and to implement it during the transfer. CGA required overall, however Pt continues to demonstrate challenges with recall of transfer techniques. Transported Pt to bathroom total A in wc. Stand pivot wc > shower seat using grab bar CGA. Once seated in shower, Pt able to complete UB bathing with SBA and LB bathing with CGA provided when reaching towards ground to wash lower B LEs and when standing with grab bar to wash buttocks. Provided education on completing lateral leans to wash buttocks vs standing at d/c with Pt receptive to education- Pt unable to complete lateral leans on shower seat, however he would be more successful when sitting on TTB that he will use at d/c. Facilitated NMRE to L UE during bathing tasks by using it for [facility] management  and incorporating it into bathing tasks. Stand pivot shower seat > wc using grab bar CGA. Pt completed U/LB dressing seated in wc donning [place] shirt with SUP and pants with CGA provided when standing to bring pants to waist. Min verbal cues required to recall and implement hemi-dressing technique during LB dressing. Grooming/hygiene tasks completed seated at sink using hemi-techniques with SUP. Pt was left resting in wc with call bell in reach, chair alarm on, and all needs met. PT entering room for session upon OT departure.   PM Session:  Pt received sitting up in wc with sister in law present in room for family education focused session. Pt presenting to be in good spirits receptive to skilled OT session reporting 0/10 pain- OT offering intermittent rest breaks, repositioning, and therapeutic support to optimize participation in therapy session. Pt's sister in law is a CNA and has a strong back ground in assisting Pt's with transfers and ADLs. She was receptive to all education provided and participatory in hands-on training to learn how to best take care of her family member. Provided education on CVA etiology/recovery process, impact of CVA on Pt's functional status, and recommendation for 24/7 supervision at d/c. Since Pt is uninsured and will not be able to receive traditional OP therapy services, provided information for pro-bono clinics in this area and simple exercises to complete to continue to increase Pt's functional outcomes. Recommended Pt get up every hour to walk a short distance with assistance from a family member. Provided education on L UE positioning to avoid injury, tone management, and gentle stretches and positioning to prevent onset of tone in L UE. Recommending use of  RW, BSC, and TTB at d/c with education provided on squat pivot and stand pivot transfer technique. Demonstrated techniques and then engaged Pt's caregiver in hands-on training demonstrating teach back as evidence of  learning. Pt and Pt's caregiver reporting they feel prepared for Pt to d/c home on 07/09. Pt was left resting in wc with call bell in reach, seatbelt alarm on, and all needs met.    Therapy Documentation Precautions:  Precautions Precautions: Fall Recall of Precautions/Restrictions: Impaired Precaution/Restrictions Comments: L hemipareisis, permissive HTN, HIV (+) Restrictions Weight Bearing Restrictions Per Provider Order: No   Therapy/Group: Individual Therapy  Juan Townsend 03/16/2024, 10:39 AM

## 2024-03-16 NOTE — Progress Notes (Addendum)
 Patient ID: Juan Townsend, male   DOB: June 07, 1976, 48 y.o.   MRN: [id number] Family is not here for education have reached out to call them.   12;15 PM Both brother and sister in-law are here and somehow got the time mixed up. Plan is still for pt to go to their home and he confirms this. Aware discharge is Wednesday and offered for them to come tomorrow but both did not confirm coming. Aware pt needs someone to walk beside him. Pt wants to apply for SSD will make referral to [facility] for this. Match placed for prescription assist. Pam-PA aware of this. Sister in-law stayed for PT session in the afternoon.

## 2024-03-16 NOTE — Progress Notes (Signed)
 Asked for brief to be changed this morning. It just came off. Patient incontinent of urine. [place] A

## 2024-03-16 NOTE — Plan of Care (Signed)
 Refuses all bowel medications.

## 2024-03-16 NOTE — Progress Notes (Signed)
 Slept good. LBM 07/02. Refused prn miralax  on previous shift and refused PRN colace from this RN. Tearful at HS. Asked if he was ok-yes or if he wanted to talk-no. Using urinal with intermittent incontinence. Usually doesn't let staff know when he's wet. Flat affect. Poor initiation. Doesn't use call light. Juan Townsend A

## 2024-03-16 NOTE — Progress Notes (Signed)
 Physical Therapy Session Note  Patient Details  Name: Juan Townsend MRN: [id number] Date of Birth: 07-08-1976  Today's Date: 03/16/2024 PT Individual Time: 9196-9168 PT Individual Time Calculation (min): 28 min   Short Term Goals: Week 2:  PT Short Term Goal 1 (Week 2): Pt will perform bed mobility from flat mattress with overall SBA. PT Short Term Goal 1 - Progress (Week 2): Met PT Short Term Goal 2 (Week 2): Pt will perform standing transfers with overall CGA/ MinA. PT Short Term Goal 2 - Progress (Week 2): Met PT Short Term Goal 3 (Week 2): Pt will perform squat pivot transfers with SBA. PT Short Term Goal 3 - Progress (Week 2): Met PT Short Term Goal 4 (Week 2): Pt will perform w/c mobility with supervision. PT Short Term Goal 4 - Progress (Week 2): Met PT Short Term Goal 5 (Week 2): pt will ambulate up to 200' with CGA using RW. PT Short Term Goal 5 - Progress (Week 2): Met Week 3:  PT Short Term Goal 1 (Week 3): STG = LTG d/t ELOS  Skilled Therapeutic Interventions/Progress Updates: Patient supine in bed on entrance to room. Patient alert and agreeable to PT session.   Patient with no pain complaint at start of session.  Therapeutic Activity: Bed Mobility: Pt performed supine <> sit with Mod I using bed rail. No cueing required for technique.  Transfers: Pt performed sit<>stand transfers with reminder to not use RW and stand first with UE push from seated surface and close supervision. Slight improvement in pivot stepping with improved coordination and performance of backward step using RW. Overall SBA required for stand pivot.    Gait Training:  Pt ambulated 110 ft using RW with L hand in saddle splint and close supervision. Return amb distance with focus on increasing knee height for increased hip flexor activation. Pt at handrail for small portion of amb bout and guided LLE in more appropriate LE stepping pattern for improved swing phase and heel strike. Unable to maintain  outside of hands-on guidance d/t increased flexor tone. Provided vc/ tc for upright posture with increased lean to L side and NDT activation at lower traps and QL as well as upward cueing at glutes.   Patient supine in bed at end of session with brakes locked, bed alarm set, and all needs within reach.   Therapy Documentation Precautions:  Precautions Precautions: Fall Recall of Precautions/Restrictions: Impaired Precaution/Restrictions Comments: L hemipareisis, permissive HTN, HIV (+) Restrictions Weight Bearing Restrictions Per Provider Order: No General:   Vital Signs:   Pain:  No pain related this session.   Therapy/Group: Individual Therapy  [name] PT, DPT, CSRS 03/16/2024, 12:49 PM

## 2024-03-16 NOTE — Progress Notes (Signed)
 Physical Therapy Session Note  Patient Details  Name: Juan Townsend MRN: [id number] Date of Birth: 1976-06-25  Today's Date: 03/16/2024 PT Individual Time: 8953-8864 PT Individual Time Calculation (min): 49 min   Short Term Goals: Week 2:  PT Short Term Goal 1 (Week 2): Pt will perform bed mobility from flat mattress with overall SBA. PT Short Term Goal 1 - Progress (Week 2): Met PT Short Term Goal 2 (Week 2): Pt will perform standing transfers with overall CGA/ MinA. PT Short Term Goal 2 - Progress (Week 2): Met PT Short Term Goal 3 (Week 2): Pt will perform squat pivot transfers with SBA. PT Short Term Goal 3 - Progress (Week 2): Met PT Short Term Goal 4 (Week 2): Pt will perform w/c mobility with supervision. PT Short Term Goal 4 - Progress (Week 2): Met PT Short Term Goal 5 (Week 2): pt will ambulate up to 200' with CGA using RW. PT Short Term Goal 5 - Progress (Week 2): Met Week 3:  PT Short Term Goal 1 (Week 3): STG = LTG d/t ELOS  Skilled Therapeutic Interventions/Progress Updates:  Patient seated upright in w/c on entrance to room. Patient alert and agreeable to PT session. Care taken over from OT.   Patient with no pain complaint at start of session.Relates he has just taken a shower with OT.   Focus this session on gait training with and without AD as well as improving balance within sit<>stands to no AD.   Gait training performed with no AD and able to reach 43' x1 and is able to self-correct based on previous cues and education as well as minimal vc for attempt to advance RLE past LLE. Provided with tc to L glute to facilitate activation and improve stability during stance phase. Good attempts and is able to improve minimally. One LOB d/t misstep and stance of R heel on L toe.   As pt will most likely need to ambulate at home and may not have w/c available, continued gait training to improve quality of gait, balance and stability simulating home distances. Is able to reach 50  foot distances about gym with SBA/ supervision. Improved quality of gait with RW and this therapist with increasing confidence in pt's ability to ambulate when necessary at home if needed without assistance.  Pt also guided in sit<>stands x6 using rolling stool in therapy gym. Pt demos improved ability to initiate from lower seat height with minimal stool movement. Also demos ability to control descent all the way to stool with minimal movement. Significant improvement from 1 week prior!  Patient seated upright in w/c at end of session with brakes locked, belt alarm set, and all needs within reach.   Therapy Documentation Precautions:  Precautions Precautions: Fall Recall of Precautions/Restrictions: Impaired Precaution/Restrictions Comments: L hemipareisis, permissive HTN, HIV (+) Restrictions Weight Bearing Restrictions Per Provider Order: No  Pain:  No pain indicated this session.   Therapy/Group: Individual Therapy  [name] PT, DPT, CSRS 03/16/2024, 1:01 PM

## 2024-03-16 NOTE — Progress Notes (Signed)
 Speech Language Pathology Daily Session Note  Patient Details  Name: Harun Brumley MRN: [id number] Date of Birth: 1976-04-04  Today's Date: 03/16/2024 SLP Individual Time: 1135-1200 SLP Individual Time Calculation (min): 25 min  Short Term Goals: Week 3: SLP Short Term Goal 1 (Week 3): STG = LTG due to ELOS  Skilled Therapeutic Interventions: Skilled therapy session focused on family education. SLP educated patient and family on patients current ST goals and POC. SLP reviewed patients recent MBS and subsequent recommendations. SLP provided visual aids to review basic oropharyngeal anatomy and physiology. SLP also educated family on patients current level of cognitive functioning, supervision needed at d/c, and provided handout regarding brain health. Patient and family verbalized understanding of all education provided with no further questions/concerns. Patient left in chair with alarm set and call bell in reach. Continue POC.    Pain None reported  Therapy/Group: Individual Therapy  Tenika Keeran M.A., CCC-SLP 03/16/2024, 7:43 AM

## 2024-03-17 MED ORDER — ASPIRIN 81 MG PO TBEC
81.0000 mg | DELAYED_RELEASE_TABLET | Freq: Every day | ORAL | 0 refills | Status: AC
  Filled 2024-03-17: qty 30, 30d supply, fill #0

## 2024-03-17 MED ORDER — ATORVASTATIN CALCIUM 20 MG PO TABS
20.0000 mg | ORAL_TABLET | Freq: Every day | ORAL | 0 refills | Status: AC
  Filled 2024-03-17: qty 30, 30d supply, fill #0

## 2024-03-17 MED ORDER — AMLODIPINE BESYLATE 10 MG PO TABS
10.0000 mg | ORAL_TABLET | Freq: Every day | ORAL | 0 refills | Status: AC
  Filled 2024-03-17: qty 30, 30d supply, fill #0

## 2024-03-17 MED ORDER — HYDRALAZINE HCL 100 MG PO TABS
100.0000 mg | ORAL_TABLET | Freq: Three times a day (TID) | ORAL | 0 refills | Status: AC
  Filled 2024-03-17: qty 90, 30d supply, fill #0

## 2024-03-17 MED ORDER — CALCIUM CARBONATE 1500 (600 CA) MG PO TABS
1.0000 | ORAL_TABLET | Freq: Every day | ORAL | 0 refills | Status: AC
  Filled 2024-03-17: qty 30, 30d supply, fill #0

## 2024-03-17 MED ORDER — NICOTINE 21 MG/24HR TD PT24
21.0000 mg | MEDICATED_PATCH | Freq: Every day | TRANSDERMAL | 0 refills | Status: AC
  Filled 2024-03-17: qty 28, 28d supply, fill #0

## 2024-03-17 MED ORDER — PANTOPRAZOLE SODIUM 40 MG PO TBEC
40.0000 mg | DELAYED_RELEASE_TABLET | Freq: Every day | ORAL | 0 refills | Status: AC
  Filled 2024-03-17: qty 30, 30d supply, fill #0

## 2024-03-17 MED ORDER — TRAZODONE HCL 50 MG PO TABS
50.0000 mg | ORAL_TABLET | Freq: Every day | ORAL | 0 refills | Status: AC
  Filled 2024-03-17: qty 30, 30d supply, fill #0

## 2024-03-17 MED ORDER — BICTEGRAVIR-EMTRICITAB-TENOFOV 50-200-25 MG PO TABS
1.0000 | ORAL_TABLET | Freq: Every day | ORAL | 0 refills | Status: DC
  Filled 2024-03-17: qty 30, 30d supply, fill #0

## 2024-03-17 MED ORDER — DOCUSATE SODIUM 100 MG PO CAPS
100.0000 mg | ORAL_CAPSULE | Freq: Two times a day (BID) | ORAL | 0 refills | Status: AC | PRN
  Filled 2024-03-17: qty 60, 30d supply, fill #0

## 2024-03-17 MED ORDER — METOPROLOL TARTRATE 50 MG PO TABS
50.0000 mg | ORAL_TABLET | Freq: Two times a day (BID) | ORAL | 0 refills | Status: AC
Start: 2024-03-17 — End: ?
  Filled 2024-03-17: qty 60, 30d supply, fill #0

## 2024-03-17 NOTE — Plan of Care (Signed)
  Problem: RH Balance Goal: LTG: Patient will maintain dynamic sitting balance (OT) Description: LTG:  Patient will maintain dynamic sitting balance with assistance during activities of daily living (OT) Outcome: Completed/Met Goal: LTG Patient will maintain dynamic standing with ADLs (OT) Description: LTG:  Patient will maintain dynamic standing balance with assist during activities of daily living (OT)  Outcome: Completed/Met   Problem: Sit to Stand Goal: LTG:  Patient will perform sit to stand in prep for activites of daily living with assistance level (OT) Description: LTG:  Patient will perform sit to stand in prep for activites of daily living with assistance level (OT) Outcome: Completed/Met   Problem: RH Eating Goal: LTG Patient will perform eating w/assist, cues/equip (OT) Description: LTG: Patient will perform eating with assist, with/without cues using equipment (OT) Outcome: Completed/Met   Problem: RH Grooming Goal: LTG Patient will perform grooming w/assist,cues/equip (OT) Description: LTG: Patient will perform grooming with assist, with/without cues using equipment (OT) Outcome: Completed/Met   Problem: RH Bathing Goal: LTG Patient will bathe all body parts with assist levels (OT) Description: LTG: Patient will bathe all body parts with assist levels (OT) Outcome: Completed/Met   Problem: RH Dressing Goal: LTG Patient will perform upper body dressing (OT) Description: LTG Patient will perform upper body dressing with assist, with/without cues (OT). Outcome: Completed/Met Goal: LTG Patient will perform lower body dressing w/assist (OT) Description: LTG: Patient will perform lower body dressing with assist, with/without cues in positioning using equipment (OT) Outcome: Completed/Met   Problem: RH Toileting Goal: LTG Patient will perform toileting task (3/3 steps) with assistance level (OT) Description: LTG: Patient will perform toileting task (3/3 steps) with  assistance level (OT)  Outcome: Completed/Met   Problem: RH Functional Use of Upper Extremity Goal: LTG Patient will use RT/LT upper extremity as a (OT) Description: LTG: Patient will use right/left upper extremity as a stabilizer/gross assist/diminished/nondominant/dominant level with assist, with/without cues during functional activity (OT) Outcome: Completed/Met   Problem: RH Toilet Transfers Goal: LTG Patient will perform toilet transfers w/assist (OT) Description: LTG: Patient will perform toilet transfers with assist, with/without cues using equipment (OT) Outcome: Completed/Met   Problem: RH Tub/Shower Transfers Goal: LTG Patient will perform tub/shower transfers w/assist (OT) Description: LTG: Patient will perform tub/shower transfers with assist, with/without cues using equipment (OT) Outcome: Completed/Met   Problem: RH Memory Goal: LTG Patient will demonstrate ability for day to day recall/carry over during activities of daily living with assistance level (OT) Description: LTG:  Patient will demonstrate ability for day to day recall/carry over during activities of daily living with assistance level (OT). Outcome: Completed/Met   Problem: RH Attention Goal: LTG Patient will demonstrate this level of attention during functional activites (OT) Description: LTG:  Patient will demonstrate this level of attention during functional activites  (OT) Outcome: Completed/Met   Problem: RH Awareness Goal: LTG: Patient will demonstrate awareness during functional activites type of (OT) Description: LTG: Patient will demonstrate awareness during functional activites type of (OT) Outcome: Completed/Met

## 2024-03-17 NOTE — Progress Notes (Signed)
 Inpatient Rehabilitation Care Coordinator Discharge Note   Patient Details  Name: Juan Townsend MRN: [id number] Date of Birth: 19-Nov-1975   Discharge location: [facility] WITH BROTHER AND SISTER IN-LAW AWARE OF HIS 24/7 CGA LEVEL  Length of Stay: 22 DAYS  Discharge activity level: SUPERVISON-CGA LEVEL  Home/community participation: ACTIVE  Patient response un:Yzjouy Literacy - How often do you need to have someone help you when you read instructions, pamphlets, or other written material from your doctor or pharmacy?: Never  Patient response un:Dnrpjo Isolation - How often do you feel lonely or isolated from those around you?: Rarely  Services provided included: MD, RD, PT, OT, SLP, RN, CM, TR, Pharmacy, Neuropsych, SW  Financial Services:  Field seismologist Utilized: Other (Comment) (MEDICAID PENDING)    Choices offered to/list presented to: PT AND BROTHER  Follow-up services arranged:  DME, Patient/Family has no preference for HH/DME agencies      DME : [facility]  ROLL[facility], 3 IN1 AND TUB BENCH  HOME EXERCISE PROGRAM CAN DO OUTPATIENT ONCE MEDICAID APPROVED. MATCH PLACED FOR 30 DAY PRESCRIPTION ASSIST PCP VIA [facility]ENTER[facility] CLINIC PLACED IN AVS APPOINTMENT 7/14 @ 3:25 PM  Patient response to transportation need: Is the patient able to respond to transportation needs?: Yes In the past 12 months, has lack of transportation kept you from medical appointments or from getting medications?: No In the past 12 months, has lack of transportation kept you from meetings, work, or from getting things needed for daily living?: No   Patient/Family verbalized understanding of follow-up arrangements:  Yes  Individual responsible for coordination of the follow-up planBETHA MARCELLINA Townsend [phone]  Confirmed correct DME delivered: Juan Townsend 03/17/2024    Comments (or additional information):SISTER IN-LAW CAME IN FOR EDUCATION AND IT WENT WELL. AWARE OF PT'S CARE NEEDS  AND WILL DO THEIR BEST TO PROVIDE CARE BOTH DO WORK THIRD SHIFT AND NEED TO SLEEP SOME  Summary of Stay    Date/Time Discharge Planning CSW  03/11/24 1006 Have scheduled famly training wiht brother and sister in-law for MOnday from 10-12. Aware he will need supervision but both work third and will be sleeping during the day. Have gotten clinic appointment for PCP follow and will order DME and will not get follow up due to uninsured RGD  03/04/24 0945 Plan to go home with brother and sister in-law have yet to talk with brother to confirm this. Both he and wife work third shift. Pt needs to be as high level as possible due to may be times he is alone while they sleep. RGD  02/26/24 0847 New evaluation according to acute chart going home with brother who works third shift. Will confirm when assess RGD       Juan Townsend, Juan Townsend

## 2024-03-17 NOTE — Progress Notes (Signed)
 Occupational Therapy Discharge Summary  Patient Details  Name: Juan Townsend MRN: [id number] Date of Birth: 04/06/1976  Date of Discharge from OT service:March 17, 2024  Today's Date: 03/17/2024 OT Individual Time: 1400-1449 OT Individual Time Calculation (min): 49 min    Patient has met 15 of 15 long term goals due to improved activity tolerance, improved balance, postural control, ability to compensate for deficits, functional use of  LEFT upper and LEFT lower extremity, improved attention, improved awareness, and improved coordination.  Patient to discharge at overall Supervision level.  Patient's care partner is independent to provide the necessary physical and cognitive assistance at discharge.    Reasons goals not met: All goals met  Recommendation:  Patient will benefit from ongoing skilled OT services in outpatient setting to continue to advance functional skills in the area of BADL, iADL, Vocation, and Reduce care partner burden.  Equipment: BSC & TTB  Reasons for discharge: treatment goals met and discharge from hospital  Patient/family agrees with progress made and goals achieved: Yes  OT Discharge Skilled Therapeutic Interventions/Progress updates:  Pt received semi-reclined in bed, dressed for the day with all ADL needs met. Pt presenting with flat affect, to be in good spirits receptive to skilled OT session reporting 0/10 pain- OT offering intermittent rest breaks, repositioning, and therapeutic support to optimize participation in therapy session. Focused this session on HEP education and functional transfer training.  Mobility: -Bed Mobility: Pt transitioned to supine > EOB with HOB flattened to simulate home environment with verbal cues required to recall log rolling technique SUP +increased time.  -Squat pivot transfers: Pt completed squat pivot transfers EOB > wc and wc > EOM with close SUP this session. Pt able to recall technique and set-up with min questioning cues-  assistance required for wc set-up prior to transfers.   Therapeutic exercise:  Pt completed the following exercises with handout and verbal instructions provided on technique to support carryover at d/c. OT provided tactile cues for muscle activation and demonstrations of technique.  - Supine Bridge  - 1 x daily - 7 x weekly - 2 sets - 8 reps - Supine Shoulder Flexion with Dowel  - 1 x daily - 7 x weekly - 2 sets - 8 reps - Supine Shoulder Press AAROM in Abduction with Dowel  - 1 x daily - 7 x weekly - 2 sets - 8 reps - Seated Scapular Retraction  - 1 x daily - 7 x weekly - 2 sets - 8 reps - Seated Shoulder Shrugs  - 1 x daily - 7 x weekly - 2 sets - 8 reps - Seated Shoulder Shrug Circles AROM Forward  - 1 x daily - 7 x weekly - 2 sets - 8 reps - Seated Single Arm Shoulder Shrug Circles AROM Backward  - 1 x daily - 7 x weekly - 2 sets - 8 reps - Seated Shoulder Extension  - 1 x daily - 7 x weekly - 2 sets - 8 reps - Scapular Retraction with Resistance  - 1 x daily - 7 x weekly - 2 sets - 8 reps  Pt was left resting in wc with call bell in reach, seatbelt alarm on, and all needs met.   ADL ADL Eating: Modified independent Where Assessed-Eating: Bed level Grooming: Supervision/safety Where Assessed-Grooming: Sitting at sink Upper Body Bathing: Supervision/safety Where Assessed-Upper Body Bathing: Shower Lower Body Bathing: Supervision/safety Where Assessed-Lower Body Bathing: Shower Upper Body Dressing: Setup Where Assessed-Upper Body Dressing: Wheelchair Lower Body Dressing: Supervision/safety  Where Assessed-Lower Body Dressing: Wheelchair Toileting: Supervision/safety Where Assessed-Toileting: Teacher, adult education: Close supervision Toilet Transfer Method: Squat pivot, Stand pivot Acupuncturist: Drop arm bedside commode, Grab bars Tub/Shower Transfer: Close supervison Web designer Method: Stand pivot, Engineer, water: Close  supervision Film/video editor Method: Squat pivot, Stand pivot [employer]: Grab bars, Sales promotion account executive Baseline Vision/History: 0 No visual deficits Patient Visual Report: No change from baseline Vision Assessment?: Yes Eye Alignment: Within Functional Limits Ocular Range of Motion: Within Functional Limits Alignment/Gaze Preference: Within Defined Limits Tracking/Visual Pursuits: Able to track stimulus in all quads without difficulty Saccades: Within functional limits Convergence: Within functional limits Perception  Perception: Impaired Perception-Other Comments: Mild to L hemibody Praxis Praxis: Impaired Praxis Impairment Details: Motor planning Cognition Cognition Overall Cognitive Status: Impaired/Different from baseline Arousal/Alertness: Awake/alert Orientation Level: Person;Place;Situation Person: Oriented Place: Oriented Situation: Oriented Memory: Impaired Memory Impairment: Decreased recall of new information;Decreased short term memory Decreased Short Term Memory: Verbal basic;Functional basic Attention: Sustained Sustained Attention: Appears intact Selective Attention: Impaired Selective Attention Impairment: Functional basic Awareness: Impaired Awareness Impairment: Emergent impairment Problem Solving: Impaired Problem Solving Impairment: Verbal basic;Functional basic Executive Function:  (impaired d/t lower level deficits) Reasoning: Impaired Reasoning Impairment: Verbal basic Safety/Judgment: Impaired Brief Interview for Mental Status (BIMS) Repetition of Three Words (First Attempt): 3 Temporal Orientation: Year: Correct Temporal Orientation: Month: Accurate within 5 days Temporal Orientation: Day: Correct Recall: Sock: No, could not recall Recall: Blue: Yes, after cueing (a color) Recall: Bed: Yes, after cueing (a piece of furniture) BIMS Summary Score: 11 Sensation Sensation Light Touch: Appears  Intact Proprioception: Impaired Detail Proprioception Impaired Details: Impaired LLE;Impaired LUE Stereognosis: Impaired by gross assessment Coordination Gross Motor Movements are Fluid and Coordinated: No Fine Motor Movements are Fluid and Coordinated: No Coordination and Movement Description: decreased motor planning and initiation, decreased motor control to L hemibody Finger Nose Finger Test: smooth movements on R, unable to compelte on L UE Motor  Motor Motor: Other (comment);Motor impersistence;Abnormal tone Motor - Discharge Observations: mild decreased motor planning and initiation, decreased motor control to L hemibody- significnat improvement from intial eval Mobility  Bed Mobility Bed Mobility: Rolling Left;Rolling Right;Supine to Sit;Sit to Supine Rolling Right: Independent with assistive device Rolling Left: Independent with assistive device Supine to Sit: Supervision/Verbal cueing Sit to Supine: Supervision/Verbal cueing Transfers Sit to Stand: Supervision/Verbal cueing Stand to Sit: Supervision/Verbal cueing  Trunk/Postural Assessment  Cervical Assessment Cervical Assessment: Exceptions to [facility] (forward head- holds head in flexed position, however can fix with verbal cues) Thoracic Assessment Thoracic Assessment: Exceptions to [facility] (rounded shoudlers) Lumbar Assessment Lumbar Assessment: Within Functional Limits Postural Control Postural Control: Deficits on evaluation Trunk Control: decreased, however improved from intial eval Righting Reactions: delayed Protective Responses: delayed  Balance Balance Balance Assessed: Yes Static Sitting Balance Static Sitting - Balance Support: Feet supported Static Sitting - Level of Assistance: 6: Modified independent (Device/Increase time) Dynamic Sitting Balance Dynamic Sitting - Balance Support: Feet supported;During functional activity Dynamic Sitting - Level of Assistance: 6: Modified independent (Device/Increase  time) Static Standing Balance Static Standing - Balance Support: Right upper extremity supported;During functional activity Static Standing - Level of Assistance: 5: Stand by assistance Dynamic Standing Balance Dynamic Standing - Balance Support: During functional activity;Bilateral upper extremity supported Dynamic Standing - Level of Assistance: 5: Stand by assistance (SBA-CGA) Extremity/Trunk Assessment RUE Assessment RUE Assessment: Within Functional Limits Active Range of Motion (AROM) Comments: AROM WFL in all fields LUE Assessment LUE Assessment: Exceptions to  WFL Passive Range of Motion (PROM) Comments: PROM WFL with pain free ROM General Strength Comments: 3-/5 shoulder flexion, bicep 3-/5, tricep 4/5, wrist flex/etension 3-/5, digit flex/extension 3/5   Katheryn SQUIBB Woodson 03/17/2024, 4:20 PM

## 2024-03-17 NOTE — Progress Notes (Signed)
 PROGRESS NOTE   Subjective/Complaints:  Family did not show for education yesterday, no other complaints today  ROS- denies Chills, CP, SOB, N/V/D, abdominal pain, pain      03/17/2024    5:41 AM 03/17/2024    5:00 AM 03/16/2024    7:59 PM  Vitals with BMI  Weight  136 lbs 14 oz   BMI  22.11   Systolic 125  118  Diastolic 75  78  Pulse 57  74      Objective:   No results found.  No results for input(s): WBC, HGB, HCT, PLT in the last 72 hours.   Recent Labs    03/16/24 0516  NA 135  K 3.9  CL 101  CO2 24  GLUCOSE 95  BUN 27*  CREATININE 2.70*  CALCIUM  8.9      Intake/Output Summary (Last 24 hours) at 03/17/2024 0838 Last data filed at 03/17/2024 0549 Gross per 24 hour  Intake 476 ml  Output 1300 ml  Net -824 ml        Physical Exam: Vital Signs Blood pressure 125/75, pulse (!) 57, temperature 98 F (36.7 C), resp. rate 18, height 5' 6 (1.676 m), weight 62.1 kg, SpO2 99%.   General: No acute distress.  Sitting in wheelchair working with therapy Mood and affect are appropriate Heart: Regular rate and rhythm no rubs murmurs or extra sounds Lungs: Clear to auscultation, breathing unlabored, no rales or wheezes Abdomen: Positive bowel sounds, soft nontender to palpation, nondistended Extremities: No clubbing, cyanosis, or edema Skin: No evidence of breakdown, no evidence of rash  Neurologic: Awake, alert, oriented x 4.  Usually answers in 1 or 2 word answers Cranial nerves II through XII intact,  motor strength is 5/5 on the right 3 left delt, bi, tri grip, 3 knee ext and hip flexion  trace/5 at foot and ankle- FIne motor LUE reduced but able to do pincer grasp, weakly , also able to oppose thumb to middle finger Sensory exam normal sensation to light touch and proprioception in bilateral upper and reduced LT lower extremities, stable 7/6  No pain or with knee ROM, no pain to palp over the  hamstrings Musculoskeletal: Full range of motion in all 4 extremities. No joint swelling  Physical exam unchanged from the above on reexamination 03/17/24    GU: urinary incontinence  Assessment/Plan: 1. Functional deficits which require 3+ hours per day of interdisciplinary therapy in a comprehensive inpatient rehab setting. Physiatrist is providing close team supervision and 24 hour management of active medical problems listed below. Physiatrist and rehab team continue to assess barriers to discharge/monitor patient progress toward functional and medical goals  Care Tool:  Bathing    Body parts bathed by patient: Left arm, Chest, Abdomen, Front perineal area, Right upper leg, Left upper leg, Face, Left lower leg, Right lower leg, Right arm, Buttocks   Body parts bathed by helper: Right arm, Buttocks     Bathing assist Assist Level: Contact Guard/Touching assist     Upper Body Dressing/Undressing Upper body dressing   What is the patient wearing?: Pull over shirt    Upper body assist Assist Level: Supervision/Verbal cueing  Lower Body Dressing/Undressing Lower body dressing      What is the patient wearing?: Incontinence brief, Pants     Lower body assist Assist for lower body dressing: Minimal Assistance - Patient > 75%     Toileting Toileting    Toileting assist Assist for toileting: Moderate Assistance - Patient 50 - 74%     Transfers Chair/bed transfer  Transfers assist  Chair/bed transfer activity did not occur: Safety/medical concerns  Chair/bed transfer assist level: Moderate Assistance - Patient 50 - 74%     Locomotion Ambulation   Ambulation assist      Assist level: Maximal Assistance - Patient 25 - 49% Assistive device: Hand held assist (+2) Max distance: 30 ft   Walk 10 feet activity   Assist     Assist level: Maximal Assistance - Patient 25 - 49% Assistive device: Hand held assist (+2)   Walk 50 feet activity   Assist Walk  50 feet with 2 turns activity did not occur: Safety/medical concerns         Walk 150 feet activity   Assist Walk 150 feet activity did not occur: Safety/medical concerns         Walk 10 feet on uneven surface  activity   Assist Walk 10 feet on uneven surfaces activity did not occur: Safety/medical concerns         Wheelchair     Assist Is the patient using a wheelchair?: Yes Type of Wheelchair: Manual    Wheelchair assist level: Dependent - Patient 0% Max wheelchair distance: 250 ft    Wheelchair 50 feet with 2 turns activity    Assist        Assist Level: Dependent - Patient 0%   Wheelchair 150 feet activity     Assist      Assist Level: Dependent - Patient 0%   Blood pressure 125/75, pulse (!) 57, temperature 98 F (36.7 C), resp. rate 18, height 5' 6 (1.676 m), weight 62.1 kg, SpO2 99%.  Medical Problem List and Plan: 1. 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 Will order PRAFO to use at night  - Stable to continue inpatient rehab  Continue CIR  2.  Antithrombotics: -DVT/anticoagulation:  Pharmaceutical: Lovenox  may d/c amb 100'             -antiplatelet therapy: DAPT X 3 weeks followed by ASA alone. Starting ASA alone 7/1 3. Pain Management: continue Tylenol  prn.  4. Mood/Behavior/Sleep: LCSW to follow for evaluation and support.              -antipsychotic agents: N/A--agitation has resolved.  5. Neuropsych/cognition: This patient is capable of making decisions on his own behalf. 6. Skin/Wound Care: Routine pressure relief measures.  7. Urinary incontinence: timed voiding q2H while awake ordered   8.  HTN:  Monitor BP TID and slowly  normalize over next few week per nephrology             --continue Amlodipine  10, metoprolol  50 mg bid and hydralazine  100 mg every 8 hrs. Vitals:  03/16/24 1959 03/17/24 0541  BP: 118/78 125/75  Pulse: 74 (!) 57  Resp:    Temp:  98 F (36.7 C)  SpO2: 100% 99%   continue amlodipine  10 mg, hydralazine  100 mg, metoprolol  50 mg per nephrology.    9. Dysphagia: improved tolerating regular diet and thin liquids   10. 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  Sent studies for atypical HUS. Creat now running in 2.5-3.0 range but has been stable , will have OP f/u with Nephro  -6/28  Will discontinue bladder scans, appears to be stable      Latest Ref Rng & Units 03/16/2024    5:16 AM 03/09/2024    5:23 AM 03/03/2024    4:59 AM  BMP  Glucose 70 - 99 mg/dL 95  93  95   BUN 6 - 20 mg/dL 27  29  32   Creatinine 0.61 - 1.24 mg/dL 7.29  7.06  7.24   Sodium 135 - 145 mmol/L 135  137  137   Potassium 3.5 - 5.1 mmol/L 3.9  4.1  3.9   Chloride 98 - 111 mmol/L 101  103  105   CO2 22 - 32 mmol/L 24  27  28    Calcium  8.9 - 10.3 mg/dL 8.9  9.0  8.7   Renal function is stable  11. HIV disease: continue BIKTARVY  50-200-25. Now followed by Dr. CHRISTELLA. Singh.   12. Anemia of chronic disease: Likely due to HIV/CKD. Will recheck in am.  Hgb stable   13. Thrombocytopenia: Platelets 51 at admission. Now up to 147.  --Monitor for recovery  14. Hypocalcemia: Ionized calcium  1.08. on supplement, corrected to 9.0 when factoring in low alb  15. Hyponatremia: mild/borderline, improved   16. Tobacco use: Nicotine  patch. Encourage nicotine  cessation.   LOS: 21 days A FACE TO FACE EVALUATION WAS PERFORMED  Juan Townsend 03/17/2024, 8:38 AM

## 2024-03-17 NOTE — Plan of Care (Signed)
  Problem: RH Swallowing Goal: LTG Patient will consume least restrictive diet using compensatory strategies with assistance (SLP) Description: LTG:  Patient will consume least restrictive diet using compensatory strategies with assistance (SLP) Outcome: Completed/Met   Problem: RH Cognition - SLP Goal: RH LTG Patient will demonstrate orientation with cues Description:  LTG:  Patient will demonstrate orientation to person/place/time/situation with cues (SLP)   Outcome: Completed/Met   Problem: RH Problem Solving Goal: LTG Patient will demonstrate problem solving for (SLP) Description: LTG:  Patient will demonstrate problem solving for basic/complex daily situations with cues  (SLP) Outcome: Completed/Met   Problem: RH Memory Goal: LTG Patient will use memory compensatory aids to (SLP) Description: LTG:  Patient will use memory compensatory aids to recall biographical/new, daily complex information with cues (SLP) Outcome: Completed/Met   Problem: RH Awareness Goal: LTG: Patient will demonstrate awareness during functional activites type of (SLP) Description: LTG: Patient will demonstrate awareness during functional activites type of (SLP) Outcome: Completed/Met

## 2024-03-17 NOTE — Progress Notes (Signed)
 Speech Language Pathology Discharge Summary  Patient Details  Name: Juan Townsend MRN: [id number] Date of Birth: 1976/03/14  Date of Discharge from SLP service:March 17, 2024  Today's Date: 03/17/2024 SLP Individual Time: 1010-1056 SLP Individual Time Calculation (min): 46 min  Skilled Therapeutic Interventions:  SLP conducted skilled therapy session targeting cognitive goals. SLP and patient discussed discharge and anticipated challenges. Patient independently identified situations in which he may require a period of adjustment upon returning home, such as using new shower equipment and navigating his home environment independently. SLP then facilitated thought organization activity where patient was tasked with completed math problems using symbol substitutes. Patient completed this task accurately with overall supervision assist. Patient then participated in familiar card game and followed rules accurately with modI. Patient was left in room with call bell in reach and alarm set. SLP will continue to target goals per plan of care.      Patient has met 5 of 5 long term goals.  Patient to discharge at overall Supervision level.  Reasons goals not met: n/a   Clinical Impression/Discharge Summary:  Patient has made excellent progress towards therapy goals during inpatient rehabilitation stay, meeting all long term goals set for duration of admission. Patient currently benefits from overall supervision assist for cognitive tasks that utilize memory, attention, and problem solving skills. He tolerates regular/thin liquids with modified independence and patient and family verbalize understanding re: results of MBS. Patient and family education complete. SLP will sign off.   Care Partner:  Caregiver Able to Provide Assistance: Yes  Type of Caregiver Assistance: Cognitive  Recommendation:  Outpatient SLP  Rationale for SLP Follow Up: Maximize cognitive function and independence   Equipment: n/a    Reasons for discharge: Discharged from hospital   Patient/Family Agrees with Progress Made and Goals Achieved: Yes   Jaquae Rieves, M.A., CCC-SLP   Madi Bonfiglio A Kylani Wires 03/17/2024, 11:00 AM

## 2024-03-17 NOTE — Progress Notes (Signed)
 Inpatient Rehabilitation Discharge Medication Review by a Pharmacist  A complete drug regimen review was completed for this patient to identify any potential clinically significant medication issues.  High Risk Drug Classes Is patient taking? Indication by Medication  Antipsychotic No   Anticoagulant No   Antibiotic No   Opioid No   Antiplatelet Yes Asiprin 81 mg - CVA prophylaxis  Hypoglycemics/insulin No   Vasoactive Medication Yes Amlodipine , hydralazine , metoprolol  - hypertension  Chemotherapy No   Other Yes Biktarvy  - antiviral for HIV Atorvastatin  - hyperlipidemia Nicotine  patch - smoking cessation Pantoprazole  - GI prophylaxis Oscal - supplement Trazodone  - sleep     Type of Medication Issue Identified Description of Issue Recommendation(s)  Drug Interaction(s) (clinically significant)     Duplicate Therapy     Allergy     No Medication Administration End Date     Incorrect Dose     Additional Drug Therapy Needed     Significant med changes from prior encounter (inform family/care partners about these prior to discharge).    Other       Clinically significant medication issues were identified that warrant physician communication and completion of prescribed/recommended actions by midnight of the next day:  No  Pharmacist comments:     Time spent performing this drug regimen review (minutes):  20   Sergio Batch, PharmD, [place], AAHIVP, CPP Infectious Disease Pharmacist 03/17/2024 7:58 AM

## 2024-03-18 ENCOUNTER — Other Ambulatory Visit (HOSPITAL_COMMUNITY): Payer: Self-pay

## 2024-03-18 MED ORDER — BICTEGRAVIR-EMTRICITAB-TENOFOV 50-200-25 MG PO TABS: 1.0000 | ORAL_TABLET | Freq: Every day | ORAL | 0 refills | Status: AC

## 2024-03-18 NOTE — Progress Notes (Signed)
 Physical Therapy Discharge Summary  Patient Details  Name: Juan Townsend MRN: [id number] Date of Birth: [birth year]  Date of Discharge from PT service:March 17, 2024  Today's Date: 03/17/2024 PT Individual Time: 8481-8389 PT Individual Time Calculation (min): 52 min   Patient has met 11 of 11 long term goals due to improved activity tolerance, improved balance, improved postural control, increased strength, functional use of  left upper extremity and left lower extremity, improved attention, improved awareness, and improved coordination.  Patient to discharge at an ambulatory level Supervision/ CGA.   Patient's care partner is independent to provide the necessary physical assistance at discharge.  Reasons goals not met: n/a  Recommendation:  Patient will benefit from ongoing skilled PT services in outpatient setting to continue to advance safe functional mobility, address ongoing impairments in strength, coordination, balance, activity tolerance, cognition, safety awareness, and to minimize fall risk.  Equipment: RW, recommendation for 16x16 w/c  Reasons for discharge: treatment goals met and discharge from hospital  Patient/family agrees with progress made and goals achieved: Yes  PT Discharge Precautions/Restrictions Precautions Precautions: Fall Precaution/Restrictions Comments: L hemipareisis, permissive HTN, HIV (+) Restrictions Weight Bearing Restrictions Per Provider Order: No  Pain Pain Assessment Pain Scale: 0-10 Pain Score: 0-No pain Pain Interference Pain Interference Pain Effect on Sleep: 0. Does not apply - I have not had any pain or hurting in the past 5 days Pain Interference with Therapy Activities: 0. Does not apply - I have not received rehabilitationtherapy in the past 5 days Pain Interference with Day-to-Day Activities: 1. Rarely or not at all Vision/Perception  Vision - History Ability to See in Adequate Light: 0 Adequate Perception Perception:  Impaired Preception Impairment Details: Inattention/Neglect Perception-Other Comments: improved from eval Praxis Praxis: Impaired Praxis Impairment Details: Motor planning Praxis-Other Comments: improving from eval  Cognition Overall Cognitive Status: Impaired/Different from baseline Arousal/Alertness: Awake/alert Orientation Level: Oriented X4 Sustained Attention: Appears intact Selective Attention: Impaired Memory: Impaired Memory Impairment: Decreased recall of new information;Decreased short term memory (improving from eval) Awareness: Impaired Problem Solving: Impaired Safety/Judgment: Impaired (improved from eval) Sensation Sensation Light Touch: Appears Intact Proprioception: Impaired Detail Proprioception Impaired Details: Impaired LLE;Impaired LUE Stereognosis: Impaired by gross assessment (LUE) Coordination Gross Motor Movements are Fluid and Coordinated: No Fine Motor Movements are Fluid and Coordinated: No Coordination and Movement Description: decreased motor planning and initiation, decreased motor control to L hemibody Heel Shin Test: improved coordination but slow for RLE, slow with LLE Motor  Motor Motor: Other (comment);Motor impersistence;Abnormal tone Motor - Discharge Observations: mild decreased motor planning and initiation, decreased motor control to L hemibody- significnat improvement from intial eval  Mobility Bed Mobility Bed Mobility: Rolling Left;Rolling Right;Supine to Sit;Sit to Supine Rolling Right: Independent with assistive device Rolling Left: Independent with assistive device Supine to Sit: Independent with assistive device Sit to Supine: Independent with assistive device Transfers Transfers: Sit to Stand;Stand to Sit;Stand Pivot Transfers;Squat Pivot Transfers Sit to Stand: Independent with assistive device Stand to Sit: Supervision/Verbal cueing Stand Pivot Transfers: Supervision/Verbal cueing Squat Pivot Transfers: Supervision/Verbal  cueing Transfer (Assistive device): Rolling walker Locomotion  Gait Ambulation: Yes Gait Assistance: Supervision/Verbal cueing;Contact Guard/Touching assist Assistive device: Rolling walker (with L hand saddle splint) Gait Assistance Details: Verbal cues for technique;Verbal cues for precautions/safety;Verbal cues for gait pattern Gait Gait: Yes Gait Pattern: Impaired Gait Pattern: Step-through pattern;Decreased step length - right;Decreased dorsiflexion - left;Left flexed knee in stance;Narrow base of support;Step-to pattern Ankle - Stance Phase - Impaired Gait Pattern: Decreased push off/heel off - Left;Decreased  stance time - Left Knee - Stance Phase - Impaired Gait Pattern: Extensor thrust - Left;Increased knee flexion - Left Knee - Swing Phase- Impaired Gait Pattern: Decreased flexion - Left Hip - Swing Phase- Impaired Gait Pattern: Decreased flexion - Left Gait velocity: decreased Stairs / Additional Locomotion Stairs: Yes Stairs Assistance: Contact Guard/Touching assist Stair Management Technique: One rail Right;Forwards;Step to pattern Height of Stairs: 6 Wheelchair Mobility Wheelchair Mobility: Yes Wheelchair Assistance: Doctor, general practice: Both lower extermities;Right upper extremity Wheelchair Parts Management: Needs assistance;Supervision/cueing  Trunk/Postural Assessment  Cervical Assessment Cervical Assessment: Exceptions to [facility] (forward head- holds head in flexed position, however can fix with verbal cues) Thoracic Assessment Thoracic Assessment: Exceptions to [facility] (rounded shoudlers) Lumbar Assessment Lumbar Assessment: Within Functional Limits Postural Control Postural Control: Deficits on evaluation Trunk Control: decreased, however improved from intial eval Righting Reactions: delayed Protective Responses: delayed  Balance Balance Balance Assessed: Yes Static Sitting Balance Static Sitting - Balance Support: Feet  supported Static Sitting - Level of Assistance: 6: Modified independent (Device/Increase time);7: Independent Dynamic Sitting Balance Dynamic Sitting - Balance Support: Feet supported;During functional activity Dynamic Sitting - Level of Assistance: 6: Modified independent (Device/Increase time) Static Standing Balance Static Standing - Balance Support: Right upper extremity supported;During functional activity Static Standing - Level of Assistance: 5: Stand by assistance Dynamic Standing Balance Dynamic Standing - Balance Support: During functional activity;Bilateral upper extremity supported Dynamic Standing - Level of Assistance: Other (comment) (SBA-CGA) Extremity Assessment      RLE Assessment RLE Assessment: Within Functional Limits LLE Assessment LLE Assessment: Exceptions to [facility] LLE Strength LLE Overall Strength: Deficits Left Hip Flexion: 3/5 Left Hip Extension: 3-/5 Left Hip ABduction: 3-/5 Left Hip ADduction: 3/5 Left Knee Flexion: 3-/5 Left Knee Extension: 3+/5 Left Ankle Dorsiflexion: 2-/5 Left Ankle Plantar Flexion: 1/5  Skilled Intervention:  PT instructed pt in Grad day assessment to measure progress toward goals. See above for details. CARETool mobility assessment  also completed; see CAREtool tab in navigator for details. Patient supine in bed on entrance to room. Patient alert and agreeable to PT session.   Patient with no pain complaint at start of session.  Pt is able to perform all bed mobility with ModI and transfers with ModI/ supervision and maintaining fair/ good balance with use of RW. Can manage placement of hand in saddle splint with IND. Ambulation >200 ft with supervision and no hands on assist. Pt noted to make adjustments to stepping progression demonstrating motor learning and improving memory of previous vc required to correct for improved quality of gait.   Significant improvements recalled with pt from start of care and he does recognize  improvements, however continues to be apprehensive re: return home. Provided with encouragement to establish routine that includes ambulation with family in home.   Patient supine in bed at end of session with brakes locked, bed alarm set, and all needs within reach.  [name] PT, DPT, CSRS 03/17/2024, 6:04 PM

## 2024-03-18 NOTE — Discharge Summary (Signed)
 Physician Discharge Summary  Patient ID: Juan Townsend MRN: [id number] DOB/AGE: 1976/04/17 48 y.o.  Admit date: 02/25/2024 Discharge date: 03/18/2024  Discharge Diagnoses:  Principal Problem:   Acute right ACA stroke [facility]) Active Problems:   Left spastic hemiparesis (HCC)   Cognitive change   Anemia   Chronic kidney disease   High blood pressure   Discharged Condition: stable  Significant Diagnostic Studies: DG Swallowing Func-Speech Pathology Result Date: 03/05/2024 Table formatting from the original result was not included. Modified Barium Swallow Study Patient Details Name: Juan Townsend MRN: [id number] Date of Birth: [birth year] Today's Date: 03/05/2024 HPI/PMH: HPI: Juan Townsend is a 48 yo male presenting to ED 6/10 with L sided weakness, falls, and L eye vision changes. 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 Pt passed the yale swallow screen 6/10 but RN noted increased coughing with liquids and SLP was consulted. FEES 6/12 inconclusive in regards to swallowing function and safety but showed bilateral arytenoid movement, but limited and incongruent in addition to minimal glottic space at rest, L aryepiglottic fold erythema, and epiglottic excresence on the R side. PMH includes CVA without residual deficits, HIV, hypercholesteremia, HTN Clinical Impression: Clinical Impression: Patient exhibits mild oropharyngeal dysphagia characterized primarily by lingering deficits to swallow timing resulting in movement of the head of the bolus into the lower pharynx at times prior to swallow initiation, which intermittently results in silent aspiration of thin liquids. Aspiration observed with first teaspoon sip of thin liquids, however no further aspiration observed with teaspoon or cup sips, nor consecutive cup sips,  throughout the study. Patient did again exhibit aspiration of thin liquids via straw sips and when taking medications with thin liquid wash. Aspiration was always silent with poor cough strength upon cued cough when attempting to expectorate aspirated material. No significant airway invasion events observed with NTL nor HTL. Recommend regular/thin liquid diet with NO STRAWS and medications administered whole in puree. Will continue to target improvments in swallow timing as patient progresses. Factors that may increase risk of adverse event in presence of aspiration Juan Townsend & Lianne 2021): Factors that may increase risk of adverse event in presence of aspiration Juan Townsend & Lianne 2021): Poor general health and/or compromised immunity; Reduced cognitive function; Inadequate oral hygiene; Weak cough Recommendations/Plan: Swallowing Evaluation Recommendations Swallowing Evaluation Recommendations Recommendations: PO diet PO Diet Recommendation: Regular; Thin liquids (Level 0) Liquid Administration via: Spoon; Cup Medication Administration: Whole meds with puree Supervision: Full supervision/cueing for swallowing strategies; Staff to assist with self-feeding Swallowing strategies  : Minimize environmental distractions; Slow rate; Small bites/sips; Head turn left during swallowing Postural changes: Position pt fully upright for meals Oral care recommendations: Oral care QID (4x/day); Oral care before PO Treatment Plan Treatment Plan Treatment recommendations: Therapy as outlined in treatment plan below Follow-up recommendations: Outpatient SLP Functional status assessment: Patient has had a recent decline in their functional status and demonstrates the ability to make significant improvements in function in a reasonable and predictable amount of time. Treatment frequency: Min 2x/week Treatment duration: 2 weeks Interventions: Aspiration precaution training; Compensatory techniques; Patient/family education; Trials of  upgraded texture/liquids; Diet toleration management by SLP Recommendations Recommendations for follow up therapy are one component of a multi-disciplinary discharge planning process, led by the attending physician.  Recommendations may be updated based on patient status, additional functional criteria and insurance authorization.  Assessment: Orofacial Exam: Orofacial Exam Oral Cavity: Oral Hygiene: WFL Oral Cavity - Dentition: Poor condition Orofacial Anatomy: WFL Oral Motor/Sensory Function: Suspected cranial nerve impairment CN V - Trigeminal: WFL CN VII - Facial: Left motor impairment CN IX - Glossopharyngeal, CN X - Vagus: Left motor impairment CN XII - Hypoglossal: Left motor impairment Anatomy: Anatomy: Suspected cervical osteophytes Boluses Administered: Boluses Administered Boluses Administered: Thin liquids (Level 0); Mildly thick liquids (Level 2, nectar thick); Moderately thick liquids (Level 3, honey thick); Puree  Oral Impairment Domain: Oral Impairment Domain Lip Closure: No labial escape Tongue control during bolus hold: Cohesive bolus between tongue to palatal seal Bolus transport/lingual motion: Brisk tongue motion Oral residue: Trace residue lining oral structures Location of oral residue : Tongue; Palate Initiation of pharyngeal swallow : Pyriform sinuses  Pharyngeal Impairment Domain: Pharyngeal Impairment Domain Soft palate elevation: No bolus between soft palate (SP)/pharyngeal wall (PW) Laryngeal elevation: Complete superior movement of thyroid cartilage with complete approximation of arytenoids to epiglottic petiole Anterior hyoid excursion: Complete anterior movement Epiglottic movement: Complete inversion Laryngeal vestibule closure: Complete, no air/contrast in laryngeal vestibule Pharyngeal stripping wave : Present - complete Pharyngeal contraction (A/P view only): N/A Pharyngoesophageal segment opening: Complete distension and complete duration, no obstruction of flow Tongue base  retraction: Trace column of contrast or air between tongue base and PPW Pharyngeal residue: Trace residue within or on pharyngeal structures Location of pharyngeal residue: Tongue base; Valleculae; Pyriform sinuses  Esophageal Impairment Domain: Esophageal Impairment Domain Esophageal clearance upright position: Complete clearance, esophageal coating Pill: Pill Consistency administered: Thin liquids (Level 0) Thin liquids (Level 0): Impaired (see clinical impressions) Penetration/Aspiration Scale Score: Penetration/Aspiration Scale Score 1.  Material does not enter airway: Moderately thick liquids (Level 3, honey thick); Puree 2.  Material enters airway, remains ABOVE vocal cords then ejected out: Mildly thick liquids (Level 2, nectar thick) 8.  Material enters airway, passes BELOW cords without attempt by patient to eject out (silent aspiration) : Thin liquids (Level 0) Compensatory Strategies: Compensatory Strategies Compensatory strategies: Yes Straw: Ineffective Ineffective Straw: Thin liquid (Level 0)   General Information: Caregiver present: No  Diet Prior to this Study: Regular; Mildly thick liquids (Level 2, nectar thick)   Temperature : Normal   Respiratory Status: WFL   Supplemental O2: None (Room air)   History of Recent Intubation: No  Behavior/Cognition: Alert; Cooperative; Requires cueing Self-Feeding Abilities: Able to self-feed Baseline vocal quality/speech: Normal Volitional Cough: Able to elicit Volitional Swallow: Able to elicit Exam Limitations: No limitations Goal Planning: Prognosis for improved oropharyngeal function: Good No data recorded No data recorded Patient/Family Stated Goal: none stated Consulted and agree with results and recommendations: Patient Pain: Pain Assessment Pain Assessment: No/denies pain End of Session: Start Time:No data recorded Stop Time: No data recorded Time Calculation:No data recorded Charges: No data recorded SLP visit diagnosis: SLP Visit Diagnosis: Dysphagia,  oropharyngeal phase (R13.12); Cognitive communication deficit (R41.841) Past Medical History: Past Medical History: Diagnosis Date  HIV (human immunodeficiency virus infection) (HCC)   Hypercholesteremia   Hypertension   Stroke [facility])  Past Surgical History: No past surgical history on file. Rosina LABOR Ellin 03/05/2024, 1:01 PM    Labs:  Basic Metabolic Panel:    Latest Ref Rng & Units 03/16/2024    5:16 AM 03/09/2024    5:23 AM 03/03/2024    4:59 AM  BMP  Glucose 70 - 99 mg/dL 95  93  95   BUN 6 - 20 mg/dL 27  29  32  Creatinine 0.61 - 1.24 mg/dL 7.29  7.06  7.24   Sodium 135 - 145 mmol/L 135  137  137   Potassium 3.5 - 5.1 mmol/L 3.9  4.1  3.9   Chloride 98 - 111 mmol/L 101  103  105   CO2 22 - 32 mmol/L 24  27  28    Calcium  8.9 - 10.3 mg/dL 8.9  9.0  8.7      CBC:    Latest Ref Rng & Units 03/09/2024    5:23 AM 03/06/2024    6:10 AM 03/02/2024    7:58 PM  CBC  WBC 4.0 - 10.5 K/uL 5.5  5.6  6.5   Hemoglobin 13.0 - 17.0 g/dL 8.6  8.4  8.6   Hematocrit 39.0 - 52.0 % 26.0  25.1  26.1   Platelets 150 - 400 K/uL 384  421  431      CBG: No results for input(s): GLUCAP in the last 168 hours.  Brief HPI:   Juan Townsend is a 48 y.o. male with history of HTN, CKD-baseline serum creatinine 1.2-1.5, HIV, prior stroke without residual deficits who was admitted on 02/18/2024 with reports of multiple falls, left-sided weakness and vision changes.  He was noted to have malignant hypertension with blood pressure 261/129 and was started on Cleviprex .  CT head negative for acute changes.  UDS positive for THC.  He was bolused with IV fluids prior to CTA head/neck which revealed right-ACA occlusion of proximal A-A3 segment with reconstitution A4 segment.  CT head was negative.  MRI not done due to shrapnel injury.  He was out of window for intervention with perfusion deficits indicating establish core infarct.  Dr. Jerri felt that right-ACA/MCA's look likely watershed due to large vessel disease and  recommended DAPT x 3 weeks followed by aspirin  alone.  Hospital course significant for bouts of agitation as well as worsening of speech deficits with follow-up CT head on 03/12 showing slight increase in prominence of 7 mm hypodensity involving posterior limb right internal capsule.  MBS done revealing aspiration of thins and nectar's therefore diet was downgraded to D3/honey liquids.  He did have worsening of renal status and nephrology was consulted for input due to rise in serum creatinine to 2.7.  He was felt to have acute on chronic renal failure in setting of elevated blood pressure and subsequent correction versus TMA given in initial mild thrombocytopenia.  Dr. Dalene recommended monitoring serum creatinine with slow decrease in blood pressure over the few weeks.  Therapy was consulted and patient was limited by left-sided weakness with left inattention and cognitive deficits.  He was requiring close to mod to max assist with mobility and mod assist with ADLs.  CIR was recommended due to functional decline.   Hospital Course: Adedamola Oquinn was admitted to rehab 02/25/2024 for inpatient therapies to consist of PT, ST and OT at least three hours five days a week. Past admission physiatrist, therapy team and rehab RN have worked together to provide customized collaborative inpatient rehab. He was maintained on DAPT x 3 weeks and transition to aspirin  alone on 07/01.  Mood has been stable during his stay.  Voiding was briefly monitored with bladder scan and no signs of retention noted.  He is continent with timed toileting.  Blood pressures were monitored on 3 times daily basis and have been controlled on current regimen.  Speech therapy has been following for dysphagia treatment with repeat MBS on 06/26 showing improvement.    Diet  has been gradually advanced to regular diet with thin liquids and is tolerating this without any signs or symptoms of aspiration.  Labs have been monitored during his stay.   Serial check of electrolytes shows steady improvement in BUN to 27 and creatinine stabilizing to 2.7.  Mild hyponatremia is stable.  Calcium  supplement added for borderline low levels.  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 Mood is stable and he has made steady gains during his stay. He requires supervision at discharge and due to lack of insurance unable to afford self pay for therapy. He has been given information to follow up with therapy clinics at [facility]. He was discharged to home on 03/18/24.    Rehab course: During patient's stay in rehab weekly team conferences were held to monitor patient's progress, set goals and discuss barriers to discharge. At admission, patient required max assist with basic ADL tasks and with mobility. He exhibited moderate to severe cognitive deficits with signs of dysphagia and D3/nectar recommended.  He  has had improvement in activity tolerance, balance, postural control as well as ability to compensate for deficits. He has had improvement in functional use LUE  and LLE as well as improvement in awareness.  He is able to complete ADL tasks with supervision. He requires supervision with verbal cues for transfers.  He requires supervision with verbal cues and contact-guard assist to ambulate 180 feet with use of a rolling walker with left hand splint.  He requires supervision for cognitive task, memory as well as problem-solving.  He is tolerating regular diet thin liquids without signs or symptoms of aspiration.  Family education was not fully been completed.   Disposition: Home  Diet: Heart Healthy  Special Instructions: No smoking, driving or strenuous activity.  Discharge Instructions     Ambulatory referral to Neurology   Complete by: As directed    An appointment is requested in approximately: 4 weeks   Ambulatory referral to Physical Medicine Rehab   Complete by: As  directed       Allergies as of 03/18/2024   No Known Allergies      Medication List     STOP taking these medications    clopidogrel  75 MG tablet Commonly known as: PLAVIX        TAKE these medications    amLODipine  10 MG tablet Commonly known as: NORVASC  Take 1 tablet (10 mg total) by mouth daily.   aspirin  EC 81 MG tablet Take 1 tablet (81 mg total) by mouth daily. Swallow whole.   atorvastatin  20 MG tablet Commonly known as: LIPITOR Take 1 tablet (20 mg total) by mouth daily with supper. What changed: when to take this   bictegravir-emtricitabine -tenofovir  AF 50-200-25 MG Tabs tablet Commonly known as: BIKTARVY  Take 1 tablet by mouth daily. Try to take at the same time each day with or without food.   calcium  carbonate 1500 (600 Ca) MG Tabs tablet Commonly known as: OSCAL Take 1 tablet (1,500 mg total) by mouth daily with lunch.   docusate sodium  100 MG capsule Commonly known as: COLACE Take 1 capsule (100 mg total) by mouth 2 (two) times daily as needed for mild constipation.   hydrALAZINE  100 MG tablet Commonly known as: APRESOLINE  Take 1 tablet (100 mg total) by mouth every 8 (eight) hours.   metoprolol  tartrate 50 MG tablet Commonly known as: LOPRESSOR  Take 1 tablet (50 mg total) by mouth 2 (two) times  daily.   nicotine  21 mg/24hr patch Commonly known as: NICODERM CQ  - dosed in mg/24 hours Place 1 patch (21 mg total) onto the skin daily.   pantoprazole  40 MG tablet Commonly known as: PROTONIX  Take 1 tablet (40 mg total) by mouth daily.   traZODone  50 MG tablet Commonly known as: DESYREL  Take 1 tablet (50 mg total) by mouth at bedtime.        Follow-up Information     Paseda, Folashade R, FNP Follow up on 03/23/2024.   Specialty: Nurse Practitioner Why: Appointment at 3:40 PM be there 3:25 PM Contact information: [address] [place], [place] [address] [phone]         Carilyn Prentice BRAVO, MD Follow up.   Specialty:  Physical Medicine and Rehabilitation Why: office will call you with follow up appointment Contact information: [address] [place] [address] [phone]         [facility] Follow up.   Why: office will call you with follow up appointment Contact information: [address]     [address] [place]  [address] [phone]        Macel Jayson PARAS, MD Follow up.   Specialty: Internal Medicine Why: Call in 1-2 days for post hospital follow up Contact information: [address] [place] [address] [phone]                 Signed: Sharlet GORMAN Schmitz 03/19/2024, 4:59 PM

## 2024-03-18 NOTE — Progress Notes (Signed)
 Physical Therapy Session Note  Patient Details  Name: Juan Townsend MRN: [id number] Date of Birth: 1976/03/31  Today's Date: 03/17/2024 PT Individual Time: 0902-1001 PT Individual Time Calculation (min): 59 min  Short Term Goals: Week 2:  PT Short Term Goal 1 (Week 2): Pt will perform bed mobility from flat mattress with overall SBA. PT Short Term Goal 1 - Progress (Week 2): Met PT Short Term Goal 2 (Week 2): Pt will perform standing transfers with overall CGA/ MinA. PT Short Term Goal 2 - Progress (Week 2): Met PT Short Term Goal 3 (Week 2): Pt will perform squat pivot transfers with SBA. PT Short Term Goal 3 - Progress (Week 2): Met PT Short Term Goal 4 (Week 2): Pt will perform w/c mobility with supervision. PT Short Term Goal 4 - Progress (Week 2): Met PT Short Term Goal 5 (Week 2): pt will ambulate up to 200' with CGA using RW. PT Short Term Goal 5 - Progress (Week 2): Met Week 3:  PT Short Term Goal 1 (Week 3): STG = LTG d/t ELOS  Skilled Therapeutic Interventions/Progress Updates:  Patient supine in bed on entrance to room. Patient alert and agreeable to PT session.   Patient with no pain complaint at start of session.  Therapeutic Activity: Pt has new bag of clothes from home and would like to change for the day as he has already spilled food on t-shirt.  Bed Mobility: Pt performed supine > sit with Mod I. No cueing required for reaching upright seated posture. Maintaining balance much improved over last 2 weeks with improved understanding of lean to R to maintain balance as well as improved trunk control. Requires vc/ tc to change position of pants when donning for improved ability to thread LLE. MinA to demonstrate and supervision to complete. Continues to require cues for safety to sit in order to initiate donning of pants after donning underwear in standing. Overall supervision to complete dressing.  Transfers: Pt performed squat pivot, sit<>stand, and stand pivot transfers  throughout session with supervision. No cueing required.   Discussed possibility for need of waterproof pad under him when sitting during day while family is asleep.   Gait Training:  Pt ambulated 180' x1/ 160' x1 using RW with supervision and rare, intermittent CGA for balance. Demonstrated continued tone in LLE preventing full extension of LLE to end swing phase. Improved ability to progress walker with minimal pauses and produces longer RLE step lengths. Provided vc/ tc for posture, maintaining wide BOS, and maintaining longer step length with RLE to improve reciprocal gait pattern.   Wheelchair Mobility:  Pt propelled wheelchair 110 feet with supervision. Improving mobility with effort and significant improvement in overall coordination. Minimal cues for foot placement during turns for improved efficiency and tighter turns.   Patient supine in bed at end of session with brakes locked, bed alarm set, and all needs within reach.    Therapy Documentation Precautions:  Precautions Precautions: Fall Recall of Precautions/Restrictions: Impaired Precaution/Restrictions Comments: L hemipareisis, permissive HTN, HIV (+) Restrictions Weight Bearing Restrictions Per Provider Order: No  Pain: No pain related this session.   Therapy/Group: Individual Therapy  [name] PT, DPT, CSRS 03/17/2024, 7:03 AM

## 2024-03-18 NOTE — Plan of Care (Signed)
  Problem: RH Balance Goal: LTG Patient will maintain dynamic sitting balance (PT) Description: LTG:  Patient will maintain dynamic sitting balance with assistance during mobility activities (PT) Outcome: Completed/Met Flowsheets (Taken 02/26/2024 1741) LTG: Pt will maintain dynamic sitting balance during mobility activities with:: Independent with assistive device  Goal: LTG Patient will maintain dynamic standing balance (PT) Description: LTG:  Patient will maintain dynamic standing balance with assistance during mobility activities (PT) Outcome: Completed/Met Flowsheets (Taken 03/18/2024 1250) LTG: Pt will maintain dynamic standing balance during mobility activities with:: Supervision/Verbal cueing   Problem: Sit to Stand Goal: LTG:  Patient will perform sit to stand with assistance level (PT) Description: LTG:  Patient will perform sit to stand with assistance level (PT) Outcome: Completed/Met Flowsheets (Taken 02/26/2024 1741) LTG: PT will perform sit to stand in preparation for functional mobility with assistance level: Supervision/Verbal cueing   Problem: RH Bed Mobility Goal: LTG Patient will perform bed mobility with assist (PT) Description: LTG: Patient will perform bed mobility with assistance, with/without cues (PT). Outcome: Completed/Met Flowsheets (Taken 02/26/2024 1741) LTG: Pt will perform bed mobility with assistance level of: Independent with assistive device    Problem: RH Bed to Chair Transfers Goal: LTG Patient will perform bed/chair transfers w/assist (PT) Description: LTG: Patient will perform bed to chair transfers with assistance (PT). Outcome: Completed/Met Flowsheets (Taken 02/26/2024 1741) LTG: Pt will perform Bed to Chair Transfers with assistance level: Supervision/Verbal cueing   Problem: RH Car Transfers Goal: LTG Patient will perform car transfers with assist (PT) Description: LTG: Patient will perform car transfers with assistance (PT). Outcome:  Completed/Met Flowsheets (Taken 02/26/2024 1741) LTG: Pt will perform car transfers with assist:: Supervision/Verbal cueing   Problem: RH Furniture Transfers Goal: LTG Patient will perform furniture transfers w/assist (OT/PT) Description: LTG: Patient will perform furniture transfers  with assistance (OT/PT). Outcome: Completed/Met Flowsheets (Taken 02/26/2024 1741) LTG: Pt will perform furniture transfers with assist:: Supervision/Verbal cueing   Problem: RH Ambulation Goal: LTG Patient will ambulate in controlled environment (PT) Description: LTG: Patient will ambulate in a controlled environment, # of feet with assistance (PT). Outcome: Completed/Met Flowsheets (Taken 02/27/2024 0544) LTG: Pt will ambulate in controlled environ  assist needed:: Contact Guard/Touching assist LTG: Ambulation distance in controlled environment: at least 150 ft using LRAD Goal: LTG Patient will ambulate in home environment (PT) Description: LTG: Patient will ambulate in home environment, # of feet with assistance (PT). Outcome: Completed/Met Flowsheets Taken 03/18/2024 1250 LTG: Pt will ambulate in home environ  assist needed:: Contact Guard/Touching assist Taken 02/26/2024 1741 LTG: Ambulation distance in home environment: up to 50 ft per bout using LRAD Note: Supervision/ CGA   Problem: RH Wheelchair Mobility Goal: LTG Patient will propel w/c in home environment (PT) Description: LTG: Patient will propel wheelchair in home environment, # of feet with assistance (PT). Outcome: Completed/Met Flowsheets (Taken 02/26/2024 1741) LTG: Pt will propel w/c in home environ  assist needed:: Independent with assistive device LTG: Propel w/c distance in home environment: up to 50 ft per bout   Problem: RH Stairs Goal: LTG Patient will ambulate up and down stairs w/assist (PT) Description: LTG: Patient will ambulate up and down # of stairs with assistance (PT) Outcome: Completed/Met Flowsheets Taken 03/18/2024  1250 LTG: Pt will ambulate up/down stairs assist needed:: Contact Guard/Touching assist Taken 02/26/2024 1741 LTG: Pt will  ambulate up and down number of stairs: at least 4 steps using HR setup as per home environment

## 2024-03-18 NOTE — Progress Notes (Addendum)
 PROGRESS NOTE   Subjective/Complaints:  No new issues overnite   ROS- denies Chills, CP, SOB, N/V/D, abdominal pain, pain      03/18/2024    5:09 AM 03/18/2024    5:08 AM 03/17/2024    8:22 PM  Vitals with BMI  Weight 135 lbs 13 oz    BMI 21.93    Systolic  126 119  Diastolic  84 79  Pulse  63 67      Objective:   No results found.  No results for input(s): WBC, HGB, HCT, PLT in the last 72 hours.   Recent Labs    03/16/24 0516  NA 135  K 3.9  CL 101  CO2 24  GLUCOSE 95  BUN 27*  CREATININE 2.70*  CALCIUM  8.9      Intake/Output Summary (Last 24 hours) at 03/18/2024 1138 Last data filed at 03/18/2024 0900 Gross per 24 hour  Intake 708 ml  Output 1000 ml  Net -292 ml        Physical Exam: Vital Signs Blood pressure 126/84, pulse 63, temperature 98.7 F (37.1 C), temperature source Oral, resp. rate 17, height 5' 6 (1.676 m), weight 61.6 kg, SpO2 100%.   General: No acute distress.  Sitting in wheelchair working with therapy Mood and affect are appropriate Heart: Regular rate and rhythm no rubs murmurs or extra sounds Lungs: Clear to auscultation, breathing unlabored, no rales or wheezes Abdomen: Positive bowel sounds, soft nontender to palpation, nondistended Extremities: No clubbing, cyanosis, or edema Skin: No evidence of breakdown, no evidence of rash  Neurologic: Awake, alert, oriented x 4.  Usually answers in 1 or 2 word answers Cranial nerves II through XII intact,  motor strength is 5/5 on the right 3 left delt, bi, tri grip, 3 knee ext and hip flexion  trace/5 at foot and ankle- FIne motor LUE reduced but able to do pincer grasp, weakly , also able to oppose thumb to middle finger Sensory exam normal sensation to light touch and proprioception in bilateral upper and reduced LT lower extremities, stable 7/6  No pain or with knee ROM, no pain to palp over the  hamstrings Musculoskeletal: Full range of motion in all 4 extremities. No joint swelling  Physical exam unchanged from the above on reexamination 03/18/24    GU: urinary incontinence  Assessment/Plan: 1. Functional deficits due to Right ACA, MCA infarct causing left spastic hemiparesis Stable for D/C today F/u PCP to establish care 03/23/24 325pm F/u PM&R 2 weeks See D/C summary See D/C instructions   Care Tool:  Bathing    Body parts bathed by patient: Left arm, Chest, Abdomen, Front perineal area, Right upper leg, Left upper leg, Face, Left lower leg, Right lower leg, Right arm, Buttocks   Body parts bathed by helper: Right arm, Buttocks     Bathing assist Assist Level: Supervision/Verbal cueing     Upper Body Dressing/Undressing Upper body dressing   What is the patient wearing?: Pull over shirt    Upper body assist Assist Level: Set up assist    Lower Body Dressing/Undressing Lower body dressing      What is the patient wearing?: Incontinence brief, Pants  Lower body assist Assist for lower body dressing: Supervision/Verbal cueing     Toileting Toileting    Toileting assist Assist for toileting: Supervision/Verbal cueing     Transfers Chair/bed transfer  Transfers assist  Chair/bed transfer activity did not occur: Safety/medical concerns  Chair/bed transfer assist level: Supervision/Verbal cueing     Locomotion Ambulation   Ambulation assist      Assist level: Contact Guard/Touching assist Assistive device: Walker-rolling Max distance: 200 ft   Walk 10 feet activity   Assist     Assist level: Supervision/Verbal cueing Assistive device: Walker-rolling   Walk 50 feet activity   Assist Walk 50 feet with 2 turns activity did not occur: Safety/medical concerns  Assist level: Contact Guard/Touching assist Assistive device: Walker-rolling    Walk 150 feet activity   Assist Walk 150 feet activity did not occur: Safety/medical  concerns  Assist level: Contact Guard/Touching assist Assistive device: Walker-rolling    Walk 10 feet on uneven surface  activity   Assist Walk 10 feet on uneven surfaces activity did not occur: Safety/medical concerns   Assist level: Contact Guard/Touching assist Assistive device: Walker-rolling   Wheelchair     Assist Is the patient using a wheelchair?: Yes Type of Wheelchair: Manual    Wheelchair assist level: Supervision/Verbal cueing Max wheelchair distance: 110 ft    Wheelchair 50 feet with 2 turns activity    Assist        Assist Level: Supervision/Verbal cueing, Contact Guard/Touching assist   Wheelchair 150 feet activity     Assist      Assist Level: Supervision/Verbal cueing   Blood pressure 126/84, pulse 63, temperature 98.7 F (37.1 C), temperature source Oral, resp. rate 17, height 5' 6 (1.676 m), weight 61.6 kg, SpO2 100%.  Medical Problem List and Plan: 1. 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 Will order PRAFO to use at night  - Stable to continue inpatient rehab  Continue CIR  2.  Antithrombotics: -DVT/anticoagulation:  Pharmaceutical: Lovenox  may d/c amb 100'             -antiplatelet therapy: DAPT X 3 weeks followed by ASA alone. Starting ASA alone 7/1 3. Pain Management: continue Tylenol  prn.  4. Mood/Behavior/Sleep: LCSW to follow for evaluation and support.              -antipsychotic agents: N/A--agitation has resolved.  5. Neuropsych/cognition: This patient is capable of making decisions on his own behalf. 6. Skin/Wound Care: Routine pressure relief measures.  7. Urinary incontinence: timed voiding q2H  while awake ordered   8.  HTN: Monitor BP TID and slowly  normalize over next few week per nephrology             --continue Amlodipine  10, metoprolol  50 mg bid and hydralazine  100 mg every 8 hrs. Vitals:   03/17/24 2022 03/18/24 0508  BP: 119/79 126/84  Pulse: 67 63  Resp: 17 17  Temp: 98.4 F (36.9 C) 98.7 F (37.1 C)  SpO2: 100% 100%   continue amlodipine  10 mg, hydralazine  100 mg, metoprolol   50 mg per nephrology.    9. Dysphagia: improved tolerating regular diet and thin liquids   10. 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  Sent studies for atypical HUS. Creat now running in 2.5-3.0 range but has been stable , will have OP f/u with Nephro  -6/28  Will discontinue bladder scans, appears to be stable      Latest Ref Rng & Units 03/16/2024    5:16 AM 03/09/2024    5:23 AM 03/03/2024    4:59 AM  BMP  Glucose 70 - 99 mg/dL 95  93  95   BUN 6 - 20 mg/dL 27  29  32   Creatinine 0.61 - 1.24 mg/dL 7.29  7.06  7.24   Sodium 135 - 145 mmol/L 135  137  137   Potassium 3.5 - 5.1 mmol/L 3.9  4.1  3.9   Chloride 98 - 111 mmol/L 101  103  105   CO2 22 - 32 mmol/L 24  27  28    Calcium  8.9 - 10.3 mg/dL 8.9  9.0  8.7   Renal function is stable  11. HIV disease: continue BIKTARVY  50-200-25. Now followed by Dr. CHRISTELLA. Singh.   12. Anemia of chronic disease: Likely due to HIV/CKD. Will recheck in am.  Hgb stable   13. Thrombocytopenia: Platelets 51 at admission. Now up to 147.  --Monitor for recovery  14. Hypocalcemia: Ionized calcium  1.08. on supplement, corrected to 9.0 when factoring in low alb  15. Hyponatremia: mild/borderline, improved   16. Tobacco use: Nicotine  patch. Encourage nicotine  cessation.   LOS: 22 days A FACE TO FACE EVALUATION WAS PERFORMED  Prentice FORBES Compton 03/18/2024, 11:38 AM

## 2024-03-19 DIAGNOSIS — D649 Anemia, unspecified: Secondary | ICD-10-CM | POA: Insufficient documentation

## 2024-03-19 DIAGNOSIS — N189 Chronic kidney disease, unspecified: Secondary | ICD-10-CM | POA: Insufficient documentation

## 2024-03-19 DIAGNOSIS — I10 Essential (primary) hypertension: Secondary | ICD-10-CM | POA: Insufficient documentation

## 2024-03-23 ENCOUNTER — Ambulatory Visit: Payer: Self-pay | Admitting: Nurse Practitioner

## 2024-03-31 LAB — MISC LABCORP TEST (SEND OUT): Labcorp test code: 630320

## 2024-04-01 ENCOUNTER — Encounter: Payer: MEDICAID | Attending: Registered Nurse | Admitting: Registered Nurse

## 2024-05-13 ENCOUNTER — Other Ambulatory Visit (HOSPITAL_COMMUNITY): Payer: Self-pay

## 2024-05-13 ENCOUNTER — Telehealth: Payer: Self-pay

## 2024-05-13 NOTE — Telephone Encounter (Signed)
 Patient calling into the office requesting refills on Amlodipine , Aspirin , Pantoprazole , Hydralazine .  Patient states that he is currently in [place], [place].  Patient has canceled and/or no showed multiple follow up appointments with our office and other Specialists since hospital discharge and still has not been seen in our office.  After reviewing dose history for the medications requested for refills, it appears that the patient has had refills from [facility] on 04/29/2024.  Will send a message to the Provider to confirm that the patient needs to follow up with [facility] for refills or further recommendations.

## 2024-05-14 NOTE — Telephone Encounter (Signed)
 Return call to patient regarding refills that patient requested.  Patient advised to contact PCP's office for medication refills.  Patient verbalized understanding.

## 2024-07-06 ENCOUNTER — Telehealth: Payer: Self-pay

## 2024-07-06 NOTE — Telephone Encounter (Signed)
 Call can not be completed, Sent a MyChart message to patient to call our office to schedule a overdue appointment with Dr. Dennise.
